# Patient Record
Sex: Female | Born: 1980
Health system: Southern US, Community
[De-identification: ages and names within clinical notes are randomized; demographics above are authoritative.]

## PROBLEM LIST (undated history)

## (undated) DIAGNOSIS — F319 Bipolar disorder, unspecified: Secondary | ICD-10-CM

## (undated) DIAGNOSIS — Z8659 Personal history of other mental and behavioral disorders: Secondary | ICD-10-CM

## (undated) DIAGNOSIS — E785 Hyperlipidemia, unspecified: Secondary | ICD-10-CM

## (undated) DIAGNOSIS — I2510 Atherosclerotic heart disease of native coronary artery without angina pectoris: Secondary | ICD-10-CM

## (undated) DIAGNOSIS — Z720 Tobacco use: Secondary | ICD-10-CM

## (undated) DIAGNOSIS — F329 Major depressive disorder, single episode, unspecified: Secondary | ICD-10-CM

## (undated) DIAGNOSIS — E109 Type 1 diabetes mellitus without complications: Secondary | ICD-10-CM

## (undated) DIAGNOSIS — F419 Anxiety disorder, unspecified: Secondary | ICD-10-CM

## (undated) HISTORY — DX: Bipolar disorder, unspecified: F31.9

## (undated) HISTORY — DX: Personal history of other mental and behavioral disorders: Z86.59

---
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
## 2001-08-30 ENCOUNTER — Encounter: Payer: Self-pay | Admitting: Emergency Medicine

## 2001-08-31 ENCOUNTER — Inpatient Hospital Stay (HOSPITAL_COMMUNITY): Admission: EM | Admit: 2001-08-31 | Discharge: 2001-09-04 | Payer: Self-pay | Admitting: Emergency Medicine

## 2001-10-15 ENCOUNTER — Encounter: Admission: RE | Admit: 2001-10-15 | Discharge: 2002-01-13 | Payer: Self-pay | Admitting: Internal Medicine

## 2002-07-15 ENCOUNTER — Encounter: Admission: RE | Admit: 2002-07-15 | Discharge: 2002-10-13 | Payer: Self-pay | Admitting: Internal Medicine

## 2016-11-04 DIAGNOSIS — E109 Type 1 diabetes mellitus without complications: Secondary | ICD-10-CM | POA: Insufficient documentation

## 2016-12-06 DIAGNOSIS — F329 Major depressive disorder, single episode, unspecified: Secondary | ICD-10-CM | POA: Insufficient documentation

## 2016-12-06 DIAGNOSIS — F32A Depression, unspecified: Secondary | ICD-10-CM | POA: Insufficient documentation

## 2017-12-25 ENCOUNTER — Ambulatory Visit: Payer: Self-pay | Admitting: Family Medicine

## 2017-12-25 VITALS — BP 105/80 | HR 86 | Temp 98.4°F | Resp 16 | Wt 190.4 lb

## 2017-12-25 DIAGNOSIS — Z7189 Other specified counseling: Secondary | ICD-10-CM

## 2017-12-25 DIAGNOSIS — S6992XA Unspecified injury of left wrist, hand and finger(s), initial encounter: Secondary | ICD-10-CM

## 2017-12-25 DIAGNOSIS — Z7185 Encounter for immunization safety counseling: Secondary | ICD-10-CM

## 2017-12-25 NOTE — Progress Notes (Signed)
Alicia Phillips is a 37 y.o. female who presents today with concerns of Patient tripped and fell on uneven pavement approx 12 hours ago and reports using hand to brace self and multiple abrasions- she cleaned area onsite and bandaged. Of note patient's left hand is casted due to a previous FOOSH injury and is due to have the cast off in 1 week. She also reports chronic condition of note Type 1 diabetes that is under control. She is visiting for a brothers wedding and a [place] native but currently living and working in [place].  Review of Systems  Constitutional: Negative for chills, fever and malaise/fatigue.  HENT: Negative for congestion, ear discharge, ear pain, sinus pain and sore throat.   Eyes: Negative.   Respiratory: Negative for cough, sputum production and shortness of breath.   Cardiovascular: Negative.  Negative for chest pain.  Gastrointestinal: Negative for abdominal pain, diarrhea, nausea and vomiting.  Genitourinary: Negative for dysuria, frequency, hematuria and urgency.  Musculoskeletal: Negative for myalgias.       Fall and left hand injury  Skin: Negative.   Neurological: Negative for headaches.  Endo/Heme/Allergies: Negative.   Psychiatric/Behavioral: Negative.     O: Vitals:   12/25/17 0816  BP: 105/80  Pulse: 86  Resp: 16  Temp: 98.4 F (36.9 C)  SpO2: 97%     Physical Exam  Constitutional: She is oriented to person, place, and time. Vital signs are normal. She appears well-developed and well-nourished. She is active.  Non-toxic appearance. She does not have a sickly appearance.  HENT:  Head: Normocephalic.  Right Ear: Hearing, external ear and ear canal normal.  Left Ear: Hearing, external ear and ear canal normal.  Nose: Nose normal.  Mouth/Throat: Uvula is midline and oropharynx is clear and moist.  Neck: Normal range of motion. Neck supple.  Cardiovascular: Normal rate, regular rhythm, normal heart sounds and normal pulses.  Pulmonary/Chest: Effort  normal.  Abdominal: Soft. Bowel sounds are normal.  Musculoskeletal: Normal range of motion.  Lymphadenopathy:       Head (right side): No submental and no submandibular adenopathy present.       Head (left side): No submental and no submandibular adenopathy present.    She has no cervical adenopathy.  Neurological: She is alert and oriented to person, place, and time.  Skin: Skin is warm. Abrasion noted. There is erythema.     Red- abrasion from fall Blue- cast in place  Psychiatric: She has a normal mood and affect.  Vitals reviewed.    A: 1. Hand injury, left, initial encounter      P: Area cleaned and dressed TDAP and VIS provided  Exam findings, diagnosis etiology and medication use and indications reviewed with patient. Follow- Up and discharge instructions provided. No emergent/urgent issues found on exam.  Patient verbalized understanding of information provided and agrees with plan of care (POC), all questions answered.  1. Hand injury, left, initial encounter  Other orders - insulin glargine (LANTUS) 100 UNIT/ML injection; Inject into the skin at bedtime. - insulin aspart (NOVOLOG) 100 UNIT/ML injection; Inject into the skin 3 (three) times daily before meals. - buPROPion (WELLBUTRIN XL) 150 MG 24 hr tablet; Take 150 mg by mouth daily.

## 2017-12-25 NOTE — Patient Instructions (Signed)
Tdap Vaccine (Tetanus, Diphtheria and Pertussis): What You Need to Know 1. Why get vaccinated? Tetanus, diphtheria and pertussis are very serious diseases. Tdap vaccine can protect us from these diseases. And, Tdap vaccine given to pregnant women can protect newborn babies against pertussis. TETANUS (Lockjaw) is rare in the [place] today. It causes painful muscle tightening and stiffness, usually all over the body.  It can lead to tightening of muscles in the head and neck so you can't open your mouth, swallow, or sometimes even breathe. Tetanus kills about 1 out of 10 people who are infected even after receiving the best medical care.  DIPHTHERIA is also rare in the [place] today. It can cause a thick coating to form in the back of the throat.  It can lead to breathing problems, heart failure, paralysis, and death.  PERTUSSIS (Whooping Cough) causes severe coughing spells, which can cause difficulty breathing, vomiting and disturbed sleep.  It can also lead to weight loss, incontinence, and rib fractures. Up to 2 in 100 adolescents and 5 in 100 adults with pertussis are hospitalized or have complications, which could include pneumonia or death.  These diseases are caused by bacteria. Diphtheria and pertussis are spread from person to person through secretions from coughing or sneezing. Tetanus enters the body through cuts, scratches, or wounds. Before vaccines, as many as 200,000 cases of diphtheria, 200,000 cases of pertussis, and hundreds of cases of tetanus, were reported in the [place] each year. Since vaccination began, reports of cases for tetanus and diphtheria have dropped by about 99% and for pertussis by about 80%. 2. Tdap vaccine Tdap vaccine can protect adolescents and adults from tetanus, diphtheria, and pertussis. One dose of Tdap is routinely given at age 11 or 12. People who did not get Tdap at that age should get it as soon as possible. Tdap is especially  important for healthcare professionals and anyone having close contact with a baby younger than 12 months. Pregnant women should get a dose of Tdap during every pregnancy, to protect the newborn from pertussis. Infants are most at risk for severe, life-threatening complications from pertussis. Another vaccine, called Td, protects against tetanus and diphtheria, but not pertussis. A Td booster should be given every 10 years. Tdap may be given as one of these boosters if you have never gotten Tdap before. Tdap may also be given after a severe cut or burn to prevent tetanus infection. Your doctor or the person giving you the vaccine can give you more information. Tdap may safely be given at the same time as other vaccines. 3. Some people should not get this vaccine  A person who has ever had a life-threatening allergic reaction after a previous dose of any diphtheria, tetanus or pertussis containing vaccine, OR has a severe allergy to any part of this vaccine, should not get Tdap vaccine. Tell the person giving the vaccine about any severe allergies.  Anyone who had coma or long repeated seizures within 7 days after a childhood dose of DTP or DTaP, or a previous dose of Tdap, should not get Tdap, unless a cause other than the vaccine was found. They can still get Td.  Talk to your doctor if you: ? have seizures or another nervous system problem, ? had severe pain or swelling after any vaccine containing diphtheria, tetanus or pertussis, ? ever had a condition called Guillain-Barr Syndrome (GBS), ? aren't feeling well on the day the shot is scheduled. 4. Risks With any medicine, including   vaccines, there is a chance of side effects. These are usually mild and go away on their own. Serious reactions are also possible but are rare. Most people who get Tdap vaccine do not have any problems with it. Mild problems following Tdap: (Did not interfere with activities)  Pain where the shot was given (about  3 in 4 adolescents or 2 in 3 adults)  Redness or swelling where the shot was given (about 1 person in 5)  Mild fever of at least 100.4F (up to about 1 in 25 adolescents or 1 in 100 adults)  Headache (about 3 or 4 people in 10)  Tiredness (about 1 person in 3 or 4)  Nausea, vomiting, diarrhea, stomach ache (up to 1 in 4 adolescents or 1 in 10 adults)  Chills, sore joints (about 1 person in 10)  Body aches (about 1 person in 3 or 4)  Rash, swollen glands (uncommon)  Moderate problems following Tdap: (Interfered with activities, but did not require medical attention)  Pain where the shot was given (up to 1 in 5 or 6)  Redness or swelling where the shot was given (up to about 1 in 16 adolescents or 1 in 12 adults)  Fever over 102F (about 1 in 100 adolescents or 1 in 250 adults)  Headache (about 1 in 7 adolescents or 1 in 10 adults)  Nausea, vomiting, diarrhea, stomach ache (up to 1 or 3 people in 100)  Swelling of the entire arm where the shot was given (up to about 1 in 500).  Severe problems following Tdap: (Unable to perform usual activities; required medical attention)  Swelling, severe pain, bleeding and redness in the arm where the shot was given (rare).  Problems that could happen after any vaccine:  People sometimes faint after a medical procedure, including vaccination. Sitting or lying down for about 15 minutes can help prevent fainting, and injuries caused by a fall. Tell your doctor if you feel dizzy, or have vision changes or ringing in the ears.  Some people get severe pain in the shoulder and have difficulty moving the arm where a shot was given. This happens very rarely.  Any medication can cause a severe allergic reaction. Such reactions from a vaccine are very rare, estimated at fewer than 1 in a million doses, and would happen within a few minutes to a few hours after the vaccination. As with any medicine, there is a very remote chance of a vaccine  causing a serious injury or death. The safety of vaccines is always being monitored. For more information, visit: www.cdc.gov/vaccinesafety/ 5. What if there is a serious problem? What should I look for? Look for anything that concerns you, such as signs of a severe allergic reaction, very high fever, or unusual behavior. Signs of a severe allergic reaction can include hives, swelling of the face and throat, difficulty breathing, a fast heartbeat, dizziness, and weakness. These would usually start a few minutes to a few hours after the vaccination. What should I do?  If you think it is a severe allergic reaction or other emergency that can't wait, call 9-1-1 or get the person to the nearest hospital. Otherwise, call your doctor.  Afterward, the reaction should be reported to the Vaccine Adverse Event Reporting System (VAERS). Your doctor might file this report, or you can do it yourself through the VAERS web site at www.vaers.hhs.gov, or by calling 1-800-822-7967. ? VAERS does not give medical advice. 6. The National Vaccine Injury Compensation Program The National   Vaccine Injury Compensation Program (VICP) is a federal program that was created to compensate people who may have been injured by certain vaccines. Persons who believe they may have been injured by a vaccine can learn about the program and about filing a claim by calling 1-714-748-6476 or visiting the VICP website at SpiritualWord.at. There is a time limit to file a claim for compensation. 7. How can I learn more?  Ask your doctor. He or she can give you the vaccine package insert or suggest other sources of information.  Call your local or state health department.  Contact the Centers for Disease Control and Prevention (CDC): ? Call 9362789386 (1-800-CDC-INFO) or ? Visit CDC's website at PicCapture.uy CDC Tdap Vaccine VIS (09/21/13) This information is not intended to replace advice given to you by your  health care provider. Make sure you discuss any questions you have with your health care provider. Document Released: 01/14/2012 Document Revised: 04/04/2016 Document Reviewed: 04/04/2016 Elsevier Interactive Patient Education  2017 Elsevier Inc. Elastic Bandage and RICE What does an elastic bandage do? Elastic bandages come in different shapes and sizes. They generally provide support to your injury and reduce swelling while you are healing, but they can perform different functions. Your health care provider will help you to decide what is best for your protection, recovery, or rehabilitation following an injury. What are some general tips for using an elastic bandage?  Use the bandage as directed by the maker of the bandage that you are using.  Do not wrap the bandage too tightly. This may cut off the circulation in the arm or leg in the area below the bandage. ? If part of your body beyond the bandage becomes blue, numb, cold, swollen, or is more painful, your bandage is most likely too tight. If this occurs, remove your bandage and reapply it more loosely.  See your health care provider if the bandage seems to be making your problems worse rather than better.  An elastic bandage should be removed and reapplied every 3-4 hours or as directed by your health care provider. What is RICE? The routine care of many injuries includes rest, ice, compression, and elevation (RICE therapy). Rest Rest is required to allow your body to heal. Generally, you can resume your routine activities when you are comfortable and have been given permission by your health care provider. Ice Icing your injury helps to keep the swelling down and it reduces pain. Do not apply ice directly to your skin.  Put ice in a plastic bag.  Place a towel between your skin and the bag.  Leave the ice on for 20 minutes, 2-3 times per day.  Do this for as long as you are directed by your health care  provider. Compression Compression helps to keep swelling down, gives support, and helps with discomfort. Compression may be done with an elastic bandage. Elevation Elevation helps to reduce swelling and it decreases pain. If possible, your injured area should be placed at or above the level of your heart or the center of your chest. When should I seek medical care? You should seek medical care if:  You have persistent pain and swelling.  Your symptoms are getting worse rather than improving.  These symptoms may indicate that further evaluation or further X-rays are needed. Sometimes, X-rays may not show a small broken bone (fracture) until a number of days later. Make a follow-up appointment with your health care provider. Ask when your X-ray results will be ready. Make  sure that you get your X-ray results. When should I seek immediate medical care? You should seek immediate medical care if:  You have a sudden onset of severe pain at or below the area of your injury.  You develop redness or increased swelling around your injury.  You have tingling or numbness at or below the area of your injury that does not improve after you remove the elastic bandage.  This information is not intended to replace advice given to you by your health care provider. Make sure you discuss any questions you have with your health care provider. Document Released: 01/04/2002 Document Revised: 06/10/2016 Document Reviewed: 02/28/2014 Elsevier Interactive Patient Education  2018 [employer].

## 2019-03-23 DIAGNOSIS — L732 Hidradenitis suppurativa: Secondary | ICD-10-CM | POA: Diagnosis not present

## 2019-03-23 DIAGNOSIS — F39 Unspecified mood [affective] disorder: Secondary | ICD-10-CM | POA: Diagnosis not present

## 2019-03-23 DIAGNOSIS — E1065 Type 1 diabetes mellitus with hyperglycemia: Secondary | ICD-10-CM | POA: Diagnosis not present

## 2019-05-03 DIAGNOSIS — Z20828 Contact with and (suspected) exposure to other viral communicable diseases: Secondary | ICD-10-CM | POA: Diagnosis not present

## 2019-05-06 DIAGNOSIS — L732 Hidradenitis suppurativa: Secondary | ICD-10-CM | POA: Diagnosis not present

## 2019-05-06 DIAGNOSIS — R208 Other disturbances of skin sensation: Secondary | ICD-10-CM | POA: Diagnosis not present

## 2019-06-21 ENCOUNTER — Other Ambulatory Visit: Payer: Self-pay

## 2019-06-21 ENCOUNTER — Ambulatory Visit (INDEPENDENT_AMBULATORY_CARE_PROVIDER_SITE_OTHER): Payer: BC Managed Care – PPO | Admitting: Adult Health

## 2019-06-21 VITALS — BP 143/83 | HR 103 | Ht 67.0 in | Wt 184.0 lb

## 2019-06-21 DIAGNOSIS — F603 Borderline personality disorder: Secondary | ICD-10-CM | POA: Diagnosis not present

## 2019-06-21 DIAGNOSIS — F331 Major depressive disorder, recurrent, moderate: Secondary | ICD-10-CM

## 2019-06-21 DIAGNOSIS — F411 Generalized anxiety disorder: Secondary | ICD-10-CM

## 2019-06-21 DIAGNOSIS — F3181 Bipolar II disorder: Secondary | ICD-10-CM | POA: Diagnosis not present

## 2019-06-21 MED ORDER — LATUDA 20 MG PO TABS: 20.0000 mg | ORAL_TABLET | Freq: Every day | ORAL | 2 refills | Status: DC

## 2019-06-21 NOTE — Progress Notes (Signed)
[facility] MD/PA/NP Initial Note  06/21/2019 10:52 AM Alicia Phillips  MRN:  [id number]  Chief Complaint:   HPI:   Describes mood today as "not very good". Pleasant. Tearful throughout interview. Mood symptoms - reports severe depression, anxiety, and irritability. Stating "I have ups and downs". Feels more "low" than anything. Fixating on "negative stuff". Going through a 3 year break up. Was living with girlfriend until early July. They had a "terrible fight" and she got in the car and drove to [place]. Feels like she needs help with her "depression more than anything". Stating "I lash out at the people I love". Does not feel like current medications are working. Consumes 2 to 3 beers 5 days a week. "Crying" every day. Having mood swings - "more lows". Having a hard time "managing her diabetes" - stating "I need to do better with that". Has been doing some online counseling - "not working". Doesn't want to do anything - "watch TV or read". Stable interest and motivation. Taking medications as prescribed.  Energy levels lower - "very low energy". Active, does not have a regular exercise routine - walking daily and Yoga. Works full-time Games developer. Enjoys some usual interests and activities. Lives alone x 3 years. Teacher - virtual. Spending time with family - Mother, brother, and sister. Father local but has not contact with him.  Appetite adequate - "I have to force myself to eat". Weight gain - 30 pounds over past 2 years. . Sleeps well most nights. Averages 6 to 8 hours. Stating "sleep is not a problem". Focus and concentration stable. Completing tasks. Managing aspects of household. Work going well.  Denies SI or HI. Denies AH or VH. Has been "scratching herself until she bleeds twice over past two weeks".   Visit Diagnosis:    ICD-10-CM   1. Major depressive disorder, recurrent episode, moderate (HCC)  F33.1   2. Generalized anxiety disorder  F41.1   3. Borderline personality disorder ([place])   F60.3   4. Bipolar II disorder ([place])  F31.81     Past Psychiatric History: Denies psychiatric hospitalizations.   Past Medical History: No past medical history on file.   Family Psychiatric History: Father has depression - manic tendencies. Younger brother with ETOH disorder - doing better. Mother and younger brother depressed.   Family History: No family history on file.  Social History:  Social History   Socioeconomic History  . Marital status: Single    Spouse name: Not on file  . Number of children: Not on file  . Years of education: Not on file  . Highest education level: Not on file  Occupational History  . Not on file  Social Needs  . Financial resource strain: Not on file  . Food insecurity    Worry: Not on file    Inability: Not on file  . Transportation needs    Medical: Not on file    Non-medical: Not on file  Tobacco Use  . Smoking status: Not on file  Substance and Sexual Activity  . Alcohol use: Not on file  . Drug use: Not on file  . Sexual activity: Not on file  Lifestyle  . Physical activity    Days per week: Not on file    Minutes per session: Not on file  . Stress: Not on file  Relationships  . Social Herbalist on phone: Not on file    Gets together: Not on file    Attends religious service:  Not on file    Active member of club or organization: Not on file    Attends meetings of clubs or organizations: Not on file    Relationship status: Not on file  Other Topics Concern  . Not on file  Social History Narrative  . Not on file    Allergies:  Allergies  Allergen Reactions  . Septra [Sulfamethoxazole-Trimethoprim]     Metabolic Disorder Labs: No results found for: HGBA1C, MPG No results found for: PROLACTIN No results found for: CHOL, TRIG, HDL, CHOLHDL, VLDL, LDLCALC No results found for: TSH  Therapeutic Level Labs: No results found for: LITHIUM No results found for: VALPROATE No components found for:  CBMZ  Current  Medications: Current Outpatient Medications  Medication Sig Dispense Refill  . ACETAMINOPHEN EXTRA STRENGTH 500 MG tablet     . busPIRone (BUSPAR) 10 MG tablet     . clindamycin (CLEOCIN T) 1 % external solution Apply 1 application topically 2 (two) times daily.    . insulin aspart (NOVOLOG) 100 UNIT/ML injection Inject into the skin 3 (three) times daily before meals.    Marland Kitchen NOVOLOG PENFILL cartridge     . spironolactone (ALDACTONE) 50 MG tablet Take 2 tablet by mouth once a day  take 1 tablet for 1 weeks, then increase to 2 daily if tolerated    . venlafaxine XR (EFFEXOR-XR) 150 MG 24 hr capsule      No current facility-administered medications for this visit.     Medication Side Effects: none  Orders placed this visit:  No orders of the defined types were placed in this encounter.   Psychiatric Specialty Exam:  Review of Systems  Musculoskeletal: Negative for falls.  Neurological: Negative for tremors and seizures.  Psychiatric/Behavioral: Positive for depression. The patient is nervous/anxious.     Blood pressure (!) 143/83, pulse (!) 103, height 5\' 7"  (1.702 m), weight 184 lb (83.5 kg).Body mass index is 28.82 kg/m.  General Appearance: Neat and Well Groomed  Eye Contact:  Good  Speech:  Clear and Coherent and Normal Rate  Volume:  Normal  Mood:  Anxious, Depressed and Irritable  Affect:  Appropriate and Tearful  Thought Process:  Coherent and Descriptions of Associations: Intact  Orientation:  Full (Time, Place, and Person)  Thought Content: Logical   Suicidal Thoughts:  No  Homicidal Thoughts:  No  Memory:  WNL  Judgement:  Good  Insight:  Good  Psychomotor Activity:  Normal  Concentration:  Concentration: Good  Recall:  Good  Fund of Knowledge: Good  Language: Good  Assets:  Communication Skills Desire for Improvement Financial Resources/Insurance Housing Intimacy Leisure Time Physical Health Resilience Social  Support Talents/Skills Transportation Vocational/Educational  ADL's:  Intact  Cognition: WNL  Prognosis:  Good   Screenings:   Receiving Psychotherapy: No   Treatment Plan/Recommendations:   Plan:  1. Effexor XR 150mg  daily to 225mg  daily 2. Buspar 10mg  BID 3. Add Latuda 20 - 1/2 tablet daily x 7 days, then one tablet daily - 2 weeks supply given  Set up with therapist for BPD. Has seen a therapist in the past.  RTC 4 weeks  Patient advised to contact office with any questions, adverse effects, or acute worsening in signs and symptoms.    , NP

## 2019-06-30 ENCOUNTER — Other Ambulatory Visit: Payer: Self-pay | Admitting: Adult Health

## 2019-06-30 DIAGNOSIS — F331 Major depressive disorder, recurrent, moderate: Secondary | ICD-10-CM

## 2019-06-30 MED ORDER — ARIPIPRAZOLE 5 MG PO TABS: ORAL_TABLET | ORAL | 2 refills | Status: DC

## 2019-07-01 ENCOUNTER — Telehealth: Payer: Self-pay

## 2019-07-01 NOTE — Telephone Encounter (Signed)
Prior authorization submitted for Latuda 20 mg with [employer], medication was denied and require she try and fail another generic atypical such as aripiprazole, clozapine, ziprasidone, risperidone, quetiapine, or olanzapine.   Rollene Fare made aware and would like patient to try Abilify (Aripiprazole) 5 mg tablet. New Rx submitted.  Left patient voicemail to call back with information.

## 2019-07-01 NOTE — Telephone Encounter (Signed)
Alicia Phillips is aware of new Rx, she will return her samples of Latuda since she didn't start taking last week when she comes for visit to see Kayla.

## 2019-07-06 DIAGNOSIS — E109 Type 1 diabetes mellitus without complications: Secondary | ICD-10-CM | POA: Diagnosis not present

## 2019-07-06 DIAGNOSIS — L732 Hidradenitis suppurativa: Secondary | ICD-10-CM | POA: Diagnosis not present

## 2019-07-07 ENCOUNTER — Other Ambulatory Visit: Payer: Self-pay

## 2019-07-07 ENCOUNTER — Ambulatory Visit (INDEPENDENT_AMBULATORY_CARE_PROVIDER_SITE_OTHER): Payer: BC Managed Care – PPO | Admitting: Addiction (Substance Use Disorder)

## 2019-07-07 DIAGNOSIS — F3181 Bipolar II disorder: Secondary | ICD-10-CM

## 2019-07-07 DIAGNOSIS — F331 Major depressive disorder, recurrent, moderate: Secondary | ICD-10-CM

## 2019-07-07 MED ORDER — ARIPIPRAZOLE 5 MG PO TABS: ORAL_TABLET | ORAL | 2 refills | Status: DC

## 2019-07-07 NOTE — Telephone Encounter (Signed)
Rx for Abilify 5 mg [facility] on [facility] but her insurance only covers at [facility]. [facility] and [facility] to [facility] on [address].
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
## 2019-07-12 ENCOUNTER — Encounter: Payer: Self-pay | Admitting: Addiction (Substance Use Disorder)

## 2019-07-14 ENCOUNTER — Ambulatory Visit (INDEPENDENT_AMBULATORY_CARE_PROVIDER_SITE_OTHER): Payer: BC Managed Care – PPO | Admitting: Addiction (Substance Use Disorder)

## 2019-07-14 ENCOUNTER — Ambulatory Visit: Payer: BC Managed Care – PPO | Admitting: Adult Health

## 2019-07-14 ENCOUNTER — Encounter: Payer: Self-pay | Admitting: Addiction (Substance Use Disorder)

## 2019-07-14 DIAGNOSIS — F3181 Bipolar II disorder: Secondary | ICD-10-CM

## 2019-07-14 DIAGNOSIS — F331 Major depressive disorder, recurrent, moderate: Secondary | ICD-10-CM

## 2019-07-14 NOTE — Progress Notes (Signed)
[facility] Counselor/Therapist Progress Note  Name: Alicia Phillips      MRN: [id number]  Date: 07/14/2019  Time spent: 10:01-10:55am ; 54 minutes ; [id number]  Reported Symptoms:  Crying spell all morning, angry at her ex who is "moving on", emotional rollercoaster, very sad, depressed, fatigued and lack of motivation, self-judgement.  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    Behavior:  Appropriate  Motor:  Normal  Speech/Language:   Normal Rate  Affect:  Tearful  Mood:  sad  Thought process:  flight of ideas  Thought content:    WNL  Sensory/Perceptual disturbances:    WNL  Orientation:  x4  Attention:  Good  Concentration:  Good  Memory:  WNL  Fund of knowledge:   Good  Insight:    Good  Judgment:   Fair  Impulse Control:  Fair    Risk Assessment: Danger to Self:  No Self-injurious Behavior: No Danger to Others: No Duty to Warn:no Physical Aggression / Violence:No  Access to Firearms a concern: No  Gang Involvement:No  Patient / guardian was educated about steps to take if suicide or homicide risk level increases between visits: yes While future psychiatric events cannot be accurately predicted, the patient does not currently require acute inpatient psychiatric care and does not currently meet [facility] involuntary commitment criteria.  Virtual Visit via Telephone Note I Connected with patient by telephone, with their informed consent, and verified patient privacy and that I am speaking with the correct person using two identifiers. I discussed the limitations, risks, security and privacy concerns of performing psychotherapy and management service by telephone and the availability of in person appointments and confirmed their location. I also discussed with the patient that there may be a patient responsible charge related to this service. The patient expressed understanding and agreed to  proceed. I discussed the treatment planning with the patient. The patient was provided an opportunity to ask questions and all were answered. The patient agreed with the plan and demonstrated an understanding of the instructions. The patient was advised to call  our office if symptoms worsen or feel they are in a crisis state and need immediate contact.   Therapist Location: office Patient Location: home  Subjective: Client is upset after receiving a christmas card from her ex that she isnt included in. Client reported having a crying spell all morning and being angry/sad about her ex trying to "move on", She is angry at how things ended and how she was blamed for everything. Client is feeling this heaviness in her throat, stomach, and reported SUDS (subjective units of distress) of a 9 out of 10. Client processed sensations and thoughts using Brainspotting provided by the therapist, as she began to identify the toxic thoughts she is burdened by: I dont like that I was part of an affair- Im ashamed of myself. Client identified a sensation of ambivalence in the lower half of the body that's connected to a thought of: I can take care of myself doing yoga, but sometimes I dont want to because it brings up so much emotion. Client continued to process those emotions from her toes that were grounded/neutral until her SUDs somatically reduced to 4 out of 10. 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 Client challenged those thoughts with confidence and replaced it with a positive personal thought of: "I know my stuff and they just need to see my abilities/what I bring to the table". Therapist used MI, CBT, DBT, Mindfulness, Experiential, and BSP with client and client made progress AEB engaging in therapies and reducing SUDS and creating new  cognitions to call on during emotional times. Therapist normalized client's emotions since in current MDD episode and not yet feeling the helpful effects of the new medication and held space for client to feel hope of continued healing. Client regulated by end of session and plans to return next week to process further with therapist.    Medications: Current Outpatient Medications  Medication Sig Dispense Refill  . ACETAMINOPHEN EXTRA STRENGTH 500 MG tablet     . ARIPiprazole (ABILIFY) 5 MG tablet Take 1/2 tablet daily x 7 days, then one tablet daily. 30 tablet 2  . busPIRone (BUSPAR) 10 MG tablet     . clindamycin (CLEOCIN T) 1 % external solution Apply 1 application topically 2 (two) times daily.    . insulin aspart (NOVOLOG) 100 UNIT/ML injection Inject into the skin 3 (three) times daily before meals.    Marland Kitchen NOVOLOG PENFILL cartridge     . spironolactone (ALDACTONE) 50 MG tablet Take 2 tablet by mouth once a day  take 1 tablet for 1 weeks, then increase to 2 daily if tolerated    . venlafaxine XR (EFFEXOR-XR) 150 MG 24 hr capsule      No current facility-administered medications for this visit.    Diagnoses:    ICD-10-CM   1. Major depressive disorder, recurrent episode, moderate (HCC)  F33.1   2. Bipolar II disorder (HCC)  F31.81    Interventions: Cognitive Behavioral Therapy, Dialectical Behavioral Therapy, Mindfulness Meditation, Motivational Interviewing, Solution-Oriented/Positive Psychology, Humanistic/Existential, Insight-Oriented and Interpersonal, Brainspotting, Flash Technique.  Plan of Care:   Client is to return to meet with therapist Zoila Shutter weekly and evaluate moving to bi-monthly following 3-6 months.  Client is to make an appt and follow through with discussing with endocryologist getting a pump to regulate blood sugar and take care of self. Client is to continue taking Abilify to help regulate mood swings. Client is to practicing distress tolerance skills to work to  decrease- crying spells and angry outbursts, by 1. using deep breathing exercises for 30 minutes until calm and 2. Using CBT thought challenging skills to challenge thoughts (ie: writing the thought down, processing it with a support, and if unhealthy thought, then challenge with truth.) Client is also to engage in Brainspotting therapy with therapist as needed for emotion regulation improvement. Client is to practice new body scans to feel sensations and integrate/layer with new positive cognition: "my brain can heal".    Pauline Good, LCSW, LCAS, CCTP, CCS-I, BSP

## 2019-07-19 ENCOUNTER — Ambulatory Visit (INDEPENDENT_AMBULATORY_CARE_PROVIDER_SITE_OTHER): Payer: BC Managed Care – PPO | Admitting: Addiction (Substance Use Disorder)

## 2019-07-19 DIAGNOSIS — F603 Borderline personality disorder: Secondary | ICD-10-CM

## 2019-07-19 DIAGNOSIS — F3181 Bipolar II disorder: Secondary | ICD-10-CM
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
## 2019-07-26 ENCOUNTER — Encounter: Payer: Self-pay | Admitting: Addiction (Substance Use Disorder)

## 2019-07-26 ENCOUNTER — Other Ambulatory Visit: Payer: Self-pay

## 2019-07-26 ENCOUNTER — Ambulatory Visit (INDEPENDENT_AMBULATORY_CARE_PROVIDER_SITE_OTHER): Payer: BC Managed Care – PPO | Admitting: Addiction (Substance Use Disorder)

## 2019-07-26 DIAGNOSIS — F603 Borderline personality disorder: Secondary | ICD-10-CM | POA: Diagnosis not present

## 2019-07-26 DIAGNOSIS — F411 Generalized anxiety disorder: Secondary | ICD-10-CM

## 2019-07-26 DIAGNOSIS — F3181 Bipolar II disorder: Secondary | ICD-10-CM

## 2019-07-26 NOTE — Progress Notes (Signed)
[facility] Counselor/Therapist Progress Note  Name: ONALEE STEINBACH      MRN: [id number]  Date: 07/26/2019  Time spent: 10:00-10:57am ; 57 minutes ; [id number]  Reported Symptoms:  Crying spells, angry at self and doctor for her body's response to her new mood stabilizer medication that caused her blood sugar to spike and her diabetes to worsen- ketoacidosis. Extreme crying spells and switching between crying and stern talking about herself. Self-critical about self/ her body/ her relationships.   Mental Status Exam:   Appearance:   Not seen- telephone call  Behavior:  Appropriate  Motor:  Normal  Speech/Language:   Normal Rate  Affect:  Tearful  Mood:  sad  Thought process:  flight of ideas  Thought content:    WNL  Sensory/Perceptual disturbances:    WNL  Orientation:  x4  Attention:  Good  Concentration:  Good  Memory:  WNL  Fund of knowledge:   Good  Insight:    Good  Judgment:   Fair  Impulse Control:  Fair    Risk Assessment: Danger to Self:  No Self-injurious Behavior: No Danger to Others: No Duty to Warn:no Physical Aggression / Violence:No  Access to Firearms a concern: No  Gang Involvement:No   Subjective: Virtual Visit via Telephone Note I Connected with patient by telephone, with their informed consent, and verified patient privacy and that I am speaking with the correct person using two identifiers. I discussed the limitations, risks, security and privacy concerns of performing psychotherapy and management service by telephone and the availability of in person appointments and confirmed their location. I also discussed with the patient that there may be a patient responsible charge related to this service. The patient expressed understanding and agreed to proceed. I discussed the treatment planning with the patient. The patient was provided an opportunity to ask questions and all were answered. The patient agreed with the plan and demonstrated an understanding of  the instructions. The patient was advised to call  our office if symptoms worsen or feel they are in a crisis state and need immediate contact. Therapist Location: office Patient Location: home Client started the session by exclaiming: "im mad at the doctor for giving me that medicine that caused my blood sugar to spike! I was so so sick over the holiday. Im just helpless." Client began to blame doctor and said she didn't want to create a divide between counselor and doctor, but continued to be upset. Client's cognitions quickly turned to self-deprivation and consisted of: "im so stupid for not checking my blood sugar earlier". Client worked with therapist using CBT, mindfulness, and self-compassion to help herself be kind to self and challenge thoughts along with remaining curious about what keeps her from checking it. Client while processing using BSP with therapist, identified core body issues and core beliefs that she and her body are unloveable and how her issues link back to that. Client made progress in session and gained much insight into her issues pushing people away or hating on herself and was able to identify the reason she doesn't check her blood sugar: because in the past shes done it and then changed the necessary insulin to make it so she could unhealthily lose weight. Client continued to process her grief from the present and her actions/emotional health that she is upset over in comparison to when this first started: her childhood. Therapist used MI, BSP, and Gestalt to help client identify this and the exact root of her shame around gaining  weight as a child with type 1 diabetes, how this made her not attractive to men, how this made her attracted to other women with body issues, and continues to cause emotional turmoil and the tearing down of herself. Client upon finishing BSP was able to identify new cognition: "Im doing really well for myself./Im okay & Im safe." Client processed her goals  in relation to that and worked with therapist to tap this new cognition in.    Medications: Current Outpatient Medications  Medication Sig Dispense Refill  . ACETAMINOPHEN EXTRA STRENGTH 500 MG tablet     . ARIPiprazole (ABILIFY) 5 MG tablet Take 1/2 tablet daily x 7 days, then one tablet daily. 30 tablet 2  . busPIRone (BUSPAR) 10 MG tablet     . clindamycin (CLEOCIN T) 1 % external solution Apply 1 application topically 2 (two) times daily.    . insulin aspart (NOVOLOG) 100 UNIT/ML injection Inject into the skin 3 (three) times daily before meals.    Marland Kitchen NOVOLOG PENFILL cartridge     . spironolactone (ALDACTONE) 50 MG tablet Take 2 tablet by mouth once a day  take 1 tablet for 1 weeks, then increase to 2 daily if tolerated    . venlafaxine XR (EFFEXOR-XR) 150 MG 24 hr capsule      No current facility-administered medications for this visit.    Diagnoses:    ICD-10-CM   1. Bipolar II disorder (HCC)  F31.81   2. Borderline personality disorder (HCC)  F60.3   3. Generalized anxiety disorder  F41.1    Interventions: Cognitive Behavioral Therapy, Dialectical Behavioral Therapy, Mindfulness Meditation, Motivational Interviewing, Solution-Oriented/Positive Psychology, Humanistic/Existential, Insight-Oriented and Interpersonal, Brainspotting, Flash Technique, Tapping, Gestalt therapy,   Plan of Care:   Client is to return to meet with therapist Zoila Shutter weekly and evaluate moving to bi-monthly following 3-6 months. Client to journal daily to get out her 'nasty thoughts'. Client is to make an appt and follow through with discussing with endocryologist getting a pump to regulate blood sugar and take care of self. Client is to continue taking Abilify to help regulate mood swings. Client is to practicing distress tolerance skills to work to decrease- crying spells and angry outbursts, by 1. using deep breathing exercises for 30 minutes until calm and 2. Using CBT thought challenging skills to  challenge thoughts (ie: writing the thought down, processing it with a support, and if unhealthy thought, then challenge with truth.) Client is also to engage in Brainspotting therapy with therapist as needed for emotion regulation improvement. Client is to practice new body scans to feel sensations and integrate/layer with new positive cognition: "my brain can heal" & "my diabetes as a child didn't break my dad. I didn't break my dad." & "im doing really well/im okay!"   Pauline Good, LCSW, LCAS, CCTP, CCS-I, BSP

## 2019-08-02 ENCOUNTER — Ambulatory Visit (INDEPENDENT_AMBULATORY_CARE_PROVIDER_SITE_OTHER): Payer: BC Managed Care – PPO | Admitting: Addiction (Substance Use Disorder)

## 2019-08-02 DIAGNOSIS — F603 Borderline personality disorder: Secondary | ICD-10-CM | POA: Diagnosis not present

## 2019-08-02 DIAGNOSIS — F3181 Bipolar II disorder: Secondary | ICD-10-CM | POA: Diagnosis not present

## 2019-08-02 DIAGNOSIS — F411 Generalized anxiety disorder: Secondary | ICD-10-CM

## 2019-08-02 NOTE — Progress Notes (Signed)
[facility] Counselor/Therapist Progress Note  Name: Alicia Phillips      MRN: [id number]  Date: 08/02/2019  Time spent: 10:00-10:57am ; 57 minutes ; [id number]  Reported Symptoms:  Crying spells, angry at self and doctor for her body's response to her new mood stabilizer medication that caused her blood sugar to spike and her diabetes to worsen- ketoacidosis. Extreme crying spells and switching between crying and stern talking about herself. Self-critical about self/ her body/ her relationships.   Mental Status Exam:   Appearance:   Not seen- telephone call  Behavior:  Appropriate  Motor:  Normal  Speech/Language:   Normal Rate  Affect:  Tearful  Mood:  sad  Thought process:  flight of ideas  Thought content:    WNL  Sensory/Perceptual disturbances:    WNL  Orientation:  x4  Attention:  Good  Concentration:  Good  Memory:  WNL  Fund of knowledge:   Good  Insight:    Good  Judgment:   Fair  Impulse Control:  Fair    Risk Assessment: Danger to Self:  No Self-injurious Behavior: No Danger to Others: No Duty to Warn:no Physical Aggression / Violence:No  Access to Firearms a concern: No  Gang Involvement:No   Subjective: Virtual Visit via Telephone Note I Connected with patient by telephone, with their informed consent, and verified patient privacy and that I am speaking with the correct person using two identifiers. I discussed the limitations, risks, security and privacy concerns of performing psychotherapy and management service by telephone and the availability of in person appointments and confirmed their location. I also discussed with the patient that there may be a patient responsible charge related to this service. The patient expressed understanding and agreed to proceed.I discussed the treatment planning with the patient. The patient was provided an opportunity to ask questions and all were answered. The patient agreed with the plan and demonstrated an understanding of the  instructions. The patient was advised to call  our office if symptoms worsen or feel they are in a crisis state and need immediate contact. Therapist Location: office. Patient Location: home. Client shared about her struggle the last week realizing her eating patterns and diabetes management is not good because she is trying to keep her weight down. She shared her disordered eating and body image issues and worked with therapist using CBT to challenge them and find false truths shes telling herself. Client discussed patterns of thought and behavior and therapist reviewed information in a workbook for treating eating disorders. therapist inquired about client's relationship with food that's disordered and engaged in narrative therapy having her dream about what could be and what relationship she wants with her body. Client discussed her need to practice mindfulness and stillness to help her sit in her body and feel okay without numbing her emotions (or self-dislike) out. Client made progress implementing DBT distress tolerance technique in session to help her deal with the feeling that came up that made her not want to check her sugar and instead was able to allow herself to see the high sugar numbers and practice self-care during session while talking with therapist. Client made plan to make checking her sugar somewhat fun, to reduce shame of high numbers, rooted in the past childhood when she felt shed get "in trouble". Therapist used MI to validate client's experiences and thoughts while reflecting them back for client to see the progress she's making in therapy.   Medications: Current Outpatient Medications  Medication Sig Dispense  Refill  . ACETAMINOPHEN EXTRA STRENGTH 500 MG tablet     . ARIPiprazole (ABILIFY) 5 MG tablet Take 1/2 tablet daily x 7 days, then one tablet daily. 30 tablet 2  . busPIRone (BUSPAR) 10 MG tablet     . clindamycin (CLEOCIN T) 1 % external solution Apply 1 application topically  2 (two) times daily.    . insulin aspart (NOVOLOG) 100 UNIT/ML injection Inject into the skin 3 (three) times daily before meals.    Marland Kitchen NOVOLOG PENFILL cartridge     . spironolactone (ALDACTONE) 50 MG tablet Take 2 tablet by mouth once a day  take 1 tablet for 1 weeks, then increase to 2 daily if tolerated    . venlafaxine XR (EFFEXOR-XR) 150 MG 24 hr capsule      No current facility-administered medications for this visit.    Diagnoses:    ICD-10-CM   1. Bipolar II disorder (HCC)  F31.81   2. Borderline personality disorder (HCC)  F60.3   3. Generalized anxiety disorder  F41.1      Interventions: Cognitive Behavioral Therapy, Dialectical Behavioral Therapy, Mindfulness Meditation, Motivational Interviewing, Solution-Oriented/Positive Psychology, Brainspotting, Flash Technique, Brain/Body approaches to healing body images.  Plan of Care:   Client is to return to meet with therapist Zoila Shutter weekly and evaluate moving to bi-monthly following 3-6 months. Client to journal daily to get out her 'nasty thoughts'. Client is to make an appt and follow through with discussing with endocryologist getting a pump to regulate blood sugar and take care of self. Client is to continue taking Abilify to help regulate mood swings. Client is to practicing distress tolerance skills to work to decrease- crying spells and angry outbursts, by 1. using deep breathing exercises for 30 minutes until calm and 2. Using CBT thought challenging skills to challenge thoughts (ie: writing the thought down, processing it with a support, and if unhealthy thought, then challenge with truth.) Client is also to engage in Brainspotting therapy with therapist as needed for emotion regulation improvement. Client is to practice new body scans to feel sensations and integrate/layer with new positive cognition: "my brain can heal" & "my diabetes as a child didn't break my dad. I didn't break my dad." & "im doing really well/im okay!"  Client is to use mindfulness to help her stay in the present moment to help her find regulation in her hard emotions.    Pauline Good, LCSW, LCAS, CCTP, CCS-I, BSP

## 2019-08-03 ENCOUNTER — Ambulatory Visit (INDEPENDENT_AMBULATORY_CARE_PROVIDER_SITE_OTHER): Payer: BC Managed Care – PPO | Admitting: Adult Health

## 2019-08-03 ENCOUNTER — Encounter: Payer: Self-pay | Admitting: Adult Health

## 2019-08-03 DIAGNOSIS — F331 Major depressive disorder, recurrent, moderate: Secondary | ICD-10-CM

## 2019-08-03 DIAGNOSIS — F411 Generalized anxiety disorder: Secondary | ICD-10-CM | POA: Diagnosis not present

## 2019-08-03 DIAGNOSIS — F3181 Bipolar II disorder: Secondary | ICD-10-CM

## 2019-08-03 DIAGNOSIS — F603 Borderline personality disorder: Secondary | ICD-10-CM

## 2019-08-03 MED ORDER — LATUDA 20 MG PO TABS: 20.0000 mg | ORAL_TABLET | Freq: Every day | ORAL | 2 refills | Status: DC

## 2019-08-03 NOTE — Progress Notes (Signed)
Alicia Phillips [id number] July 23, 1981 39 y.o.  Virtual Visit via Telephone Note  I connected with pt on 08/03/19 at 10:20 AM EST by telephone and verified that I am speaking with the correct person using two identifiers.   I discussed the limitations, risks, security and privacy concerns of performing an evaluation and management service by telephone and the availability of in person appointments. I also discussed with the patient that there may be a patient responsible charge related to this service. The patient expressed understanding and agreed to proceed.   I discussed the assessment and treatment plan with the patient. The patient was provided an opportunity to ask questions and all were answered. The patient agreed with the plan and demonstrated an understanding of the instructions.   The patient was advised to call back or seek an in-person evaluation if the symptoms worsen or if the condition fails to improve as anticipated.  I provided 30 minutes of non-face-to-face time during this encounter.  The patient was located at home.  The provider was located at [facility].   Dorothyann Gibbs, NP   Subjective:   Patient ID:  Alicia Phillips is a 39 y.o. (DOB 1980-11-24) female.  Chief Complaint: No chief complaint on file.   HPI   Alicia Phillips presents for follow-up of GAD, MDD, Borderline personality disorder, BPD 2  Describes mood today as "low". Pleasant. Tearful throughout interview. Mood symptoms - reports depression, anxiety, and irritability. Was not able to start the [place] - PA from insurance required other medications trials prior. Started on Abilify. Patient reports "spikes" in sugar levels after trying the Abilify. Tearful. Stating "it really scared and concerned me". Tapered off of Abilify and feel better "physically". Would like to try the Latuda. Continues to have "ups and downs". Feels more "depressed" than anything. Not feeling as irritable and  "lashing out". Stayed at home for the holidays. Has stopped drinking. Varying interest and motivation. Has started seeing a therapist and feels it is going "well". Taking medications as prescribed.  Energy levels lower. Active, does not have a regular exercise routine. Walking daily and Yoga. Works full-time Film/video editor. Enjoys some usual interests and activities. Single. Lives alone. Teacher - virtual. Spending time with family - Mother, brother, and sister local.  Appetite adequate - "I have to force myself to eat". Weight gain - 30 pounds over past 2 years. . Sleeps well most nights. Averages 6 to 8 hours.  Focus and concentration stable. Completing tasks. Managing aspects of household. Work going well.  Denies SI or HI. Denies AH or VH.   Review of Systems:  Review of Systems  Musculoskeletal: Negative for gait problem.  Neurological: Negative for tremors.  Psychiatric/Behavioral:       Please refer to HPI    Medications: I have reviewed the patient's current medications.  Current Outpatient Medications  Medication Sig Dispense Refill  . ACETAMINOPHEN EXTRA STRENGTH 500 MG tablet     . busPIRone (BUSPAR) 10 MG tablet     . clindamycin (CLEOCIN T) 1 % external solution Apply 1 application topically 2 (two) times daily.    . insulin aspart (NOVOLOG) 100 UNIT/ML injection Inject into the skin 3 (three) times daily before meals.    Marland Kitchen lurasidone (LATUDA) 20 MG TABS tablet Take 1 tablet (20 mg total) by mouth daily. 30 tablet 2  . NOVOLOG PENFILL cartridge     . spironolactone (ALDACTONE) 50 MG tablet Take 2 tablet by mouth once a day  take 1 tablet for 1 weeks, then increase to 2 daily if tolerated    . venlafaxine XR (EFFEXOR-XR) 150 MG 24 hr capsule      No current facility-administered medications for this visit.    Medication Side Effects: None  Allergies:  Allergies  Allergen Reactions  . Septra [Sulfamethoxazole-Trimethoprim]     No past medical history on file.  No  family history on file.  Social History   Socioeconomic History  . Marital status: Single    Spouse name: Not on file  . Number of children: Not on file  . Years of education: Not on file  . Highest education level: Not on file  Occupational History  . Not on file  Tobacco Use  . Smoking status: Never Smoker  . Smokeless tobacco: Never Used  Substance and Sexual Activity  . Alcohol use: Not on file  . Drug use: Not on file  . Sexual activity: Not on file  Other Topics Concern  . Not on file  Social History Narrative  . Not on file   Social Determinants of Health   Financial Resource Strain:   . Difficulty of Paying Living Expenses: Not on file  Food Insecurity:   . Worried About Charity fundraiser in the Last Year: Not on file  . Ran Out of Food in the Last Year: Not on file  Transportation Needs:   . Lack of Transportation (Medical): Not on file  . Lack of Transportation (Non-Medical): Not on file  Physical Activity:   . Days of Exercise per Week: Not on file  . Minutes of Exercise per Session: Not on file  Stress:   . Feeling of Stress : Not on file  Social Connections:   . Frequency of Communication with Friends and Family: Not on file  . Frequency of Social Gatherings with Friends and Family: Not on file  . Attends Religious Services: Not on file  . Active Member of Clubs or Organizations: Not on file  . Attends Archivist Meetings: Not on file  . Marital Status: Not on file  Intimate Partner Violence:   . Fear of Current or Ex-Partner: Not on file  . Emotionally Abused: Not on file  . Physically Abused: Not on file  . Sexually Abused: Not on file    Past Medical History, Surgical history, Social history, and Family history were reviewed and updated as appropriate.   Please see review of systems for further details on the patient's review from today.   Objective:   Physical Exam:  There were no vitals taken for this visit.  Physical  Exam Constitutional:      General: She is not in acute distress.    Appearance: She is well-developed.  Musculoskeletal:        General: No deformity.  Neurological:     Mental Status: She is alert and oriented to person, place, and time.     Coordination: Coordination normal.  Psychiatric:        Attention and Perception: Attention and perception normal. She does not perceive auditory or visual hallucinations.        Mood and Affect: Mood is depressed. Mood is not anxious. Affect is tearful. Affect is not labile, blunt, angry or inappropriate.        Speech: Speech normal.        Behavior: Behavior normal.        Thought Content: Thought content normal. Thought content is not paranoid or delusional. Thought  content does not include homicidal or suicidal ideation. Thought content does not include homicidal or suicidal plan.        Cognition and Memory: Cognition and memory normal.        Judgment: Judgment normal.     Comments: Insight intact     Lab Review:  No results found for: NA, K, CL, CO2, GLUCOSE, BUN, CREATININE, CALCIUM, PROT, ALBUMIN, AST, ALT, ALKPHOS, BILITOT, GFRNONAA, GFRAA  No results found for: WBC, RBC, HGB, HCT, PLT, MCV, MCH, MCHC, RDW, LYMPHSABS, MONOABS, EOSABS, BASOSABS  No results found for: POCLITH, LITHIUM   No results found for: PHENYTOIN, PHENOBARB, VALPROATE, CBMZ   .res Assessment: Plan:    Plan:  1. Effexor XR 225mg  daily 2. Buspar 10mg  BID 3. Stopped Abilify 5mg  daily - increased BGS   4. Add Latuda 20mg  daily - will leave samples for patient to pick up  Consider Lamictal and Prozac  Set up with therapist for BPD. Has seen a therapist in the past.  RTC 4 weeks  Patient advised to contact office with any questions, adverse effects, or acute worsening in signs and symptoms.  Diagnoses and all orders for this visit:  Bipolar II disorder (HCC) -     lurasidone (LATUDA) 20 MG TABS tablet; Take 1 tablet (20 mg total) by mouth  daily.  Major depressive disorder, recurrent episode, moderate (HCC)  Generalized anxiety disorder  Borderline personality disorder (HCC)    Please see After Visit Summary for patient specific instructions.  No future appointments.  No orders of the defined types were placed in this encounter.     -------------------------------

## 2019-08-06 ENCOUNTER — Telehealth: Payer: Self-pay | Admitting: Adult Health

## 2019-08-06 NOTE — Telephone Encounter (Signed)
Pt was to start on new med Latuda, but her insurance will not pay for it. She wants to know if there is a second option that she can take that is not that expensive. Send to [facility] on [address]

## 2019-08-06 NOTE — Telephone Encounter (Signed)
Latuda needs to have a prior authorization submitted first before a change

## 2019-08-09 ENCOUNTER — Ambulatory Visit (INDEPENDENT_AMBULATORY_CARE_PROVIDER_SITE_OTHER): Payer: BC Managed Care – PPO | Admitting: Addiction (Substance Use Disorder)

## 2019-08-09 DIAGNOSIS — F411 Generalized anxiety disorder: Secondary | ICD-10-CM

## 2019-08-09 DIAGNOSIS — F3181 Bipolar II disorder: Secondary | ICD-10-CM

## 2019-08-09 NOTE — Telephone Encounter (Signed)
Patient made aware. I left her a detailed message and asked her to call back with any questions.

## 2019-08-09 NOTE — Progress Notes (Signed)
[facility] Counselor/Therapist Progress Note  Name: Alicia Phillips      MRN: [id number]  Date: 08/09/2019  Time spent: 9:05-9:58 53 minutes  Reported Symptoms:  Upset/hopeless, angry at trouble getting meds. Crying spells back and forth while talking with therapist  Mental Status Exam:   Appearance:   groomed  Behavior:  Appropriate  Motor:  Normal  Speech/Language:   Normal Rate  Affect:  Congruent and Tearful  Mood:  irritable, labile and sad  Thought process:  flight of ideas and tangential  Thought content:    Obsessions and Tangential  Sensory/Perceptual disturbances:    WNL  Orientation:  x4  Attention:  Good  Concentration:  Good  Memory:  WNL  Fund of knowledge:   Good  Insight:    Good  Judgment:   Good  Impulse Control:  Fair    Risk Assessment: Danger to Self:  No Self-injurious Behavior: No Danger to Others: No Duty to Warn:no Physical Aggression / Violence:No  Access to Firearms a concern: No  Gang Involvement:No   Subjective: Virtual Visit via Telephone Note Therapist connected with patient by telephone, with their informed consent, and verified patient privacy and that I am speaking with the correct person using two identifiers. I discussed the limitations, risks, security and privacy concerns of performing psychotherapy and management service by telephone and the availability of in person appointments and confirmed their location. I also discussed with the patient that there may be a patient responsible charge related to this service. The patient expressed understanding and agreed to proceed.I discussed her treatment planning with the patient. The patient was provided an opportunity to ask questions and all were answered. The patient agreed with the plan and demonstrated an understanding of the instructions. The patient was told to call 9-1-1 if in emergency or call the office if symptoms worsen in relation to her new medication. Therapist Location: office.  Patient Location: home. Client expressed increased hopelessness around issues about taking new medication Latuda. Therapist used MI and CBT and narrative therapy to affirm clients feelings and thoughts plaguing her along with encouraging the use of the miracle question to help client have hope for positive change. Client shared her obsessive anxiety about triggering her blood sugar spikes from new medication. Client unable to pay the $125 her insurance is charging her to fill medication and was hopeless about the situation. Therapist used SFT and MI and RPT to help client organize her plan and even find a patient copay program that brought it down to $15 a perscription for her MH medication. Therapist and client worked together on setting up all these items and working on thoughts to help her try the medication and remain hopeful about its efficacy while observing how it affects her diabetes. Client made progress in session with therapist working through the issue to help her find emotion regulation by end of session.    Medications: Current Outpatient Medications  Medication Sig Dispense Refill  . ACETAMINOPHEN EXTRA STRENGTH 500 MG tablet     . busPIRone (BUSPAR) 10 MG tablet     . clindamycin (CLEOCIN T) 1 % external solution Apply 1 application topically 2 (two) times daily.    . insulin aspart (NOVOLOG) 100 UNIT/ML injection Inject into the skin 3 (three) times daily before meals.    Marland Kitchen lurasidone (LATUDA) 20 MG TABS tablet Take 1 tablet (20 mg total) by mouth daily. 30 tablet 2  . NOVOLOG PENFILL cartridge     . spironolactone (ALDACTONE) 50  MG tablet Take 2 tablet by mouth once a day  take 1 tablet for 1 weeks, then increase to 2 daily if tolerated    . venlafaxine XR (EFFEXOR-XR) 150 MG 24 hr capsule      No current facility-administered medications for this visit.    Diagnoses:    ICD-10-CM   1. Bipolar II disorder ([place])  F31.81   2. Generalized anxiety disorder  F41.1       Interventions: Cognitive Behavioral Therapy, Dialectical Behavioral Therapy, Mindfulness Meditation, Motivational Interviewing, Solution-Oriented/Positive Psychology, Brainspotting, Flash Technique, Narrative Therapy, Brain/Body approaches to healing body images.  Plan of Care:  Client is to return to meet with therapist Sammuel Cooper weekly and evaluate moving to bi-monthly following 3-6 months. Client to journal daily to get out her 'nasty thoughts'. Client is to make an appt and follow through with discussing with endocryologist getting a pump to regulate blood sugar and take care of self. Client is to continue taking Abilify to help regulate mood swings. Client is to practicing distress tolerance skills to work to decrease- crying spells and angry outbursts, by 1. using deep breathing exercises for 30 minutes until calm and 2. Using CBT thought challenging skills to challenge thoughts (ie: writing the thought down, processing it with a support, and if unhealthy thought, then challenge with truth.) Client is also to engage in [place] therapy with therapist as needed for emotion regulation improvement. Client is to practice new body scans to feel sensations and integrate/layer with new positive cognition: "my brain can heal" & "my diabetes as a child didn't break my dad. I didn't break my dad." & "im doing really well/im okay!" Client is to use mindfulness to help her stay in the present moment to help her find regulation in her hard emotions.  Client to call pharmacy to ask about using Latuda discount savings card to reduce price of medication from $125 to $15 a month for at least 12 months.    Barnie Del, LCSW, LCAS, CCTP, CCS-I, BSP

## 2019-08-09 NOTE — Telephone Encounter (Signed)
Noted  

## 2019-08-11 NOTE — Telephone Encounter (Signed)
Called [employer] at [phone] to check status of Prior Authorization for Latuda 20 mg, it has been approved effective 08/10/2019-08/09/2020   [id number]

## 2019-08-25 ENCOUNTER — Ambulatory Visit (INDEPENDENT_AMBULATORY_CARE_PROVIDER_SITE_OTHER): Payer: BC Managed Care – PPO | Admitting: Addiction (Substance Use Disorder)

## 2019-08-25 DIAGNOSIS — F3181 Bipolar II disorder: Secondary | ICD-10-CM | POA: Diagnosis not present

## 2019-08-25 DIAGNOSIS — F603 Borderline personality disorder: Secondary | ICD-10-CM | POA: Diagnosis not present

## 2019-08-25 NOTE — Progress Notes (Signed)
[facility] Counselor/Therapist Progress Note  Name: Alicia Phillips      MRN: [id number]  Date: 08/25/2019  Time spent: 9:05-9:58 53 minutes  Reported Symptoms:  Crying during session and during mornings for 3 ours. Feeling Hopelessness and still questioning her career and ability to get better with her MH.   Mental Status Exam:   Appearance:   discheveled  Behavior:  Appropriate  Motor:  Normal  Speech/Language:   Normal Rate  Affect:  Congruent and Tearful  Mood:  dysthymic, irritable, labile and sad  Thought process:  circumstantial  Thought content:    Obsessions and Tangential  Sensory/Perceptual disturbances:    WNL  Orientation:  x4  Attention:  Good  Concentration:  Good  Memory:  WNL  Fund of knowledge:   Good  Insight:    Fair  Judgment:   Good  Impulse Control:  Fair    Risk Assessment: Danger to Self:  No Self-injurious Behavior: No Danger to Others: No Duty to Warn:no Physical Aggression / Violence:No  Access to Firearms a concern: No  Gang Involvement:No   Subjective: Virtual Visit via Telephone Note Therapist connected with patient by telephone, with their informed consent, and verified patient privacy and that I am speaking with the correct person using two identifiers. I discussed the limitations, risks, security and privacy concerns of performing psychotherapy and management service by telephone and the availability of in person appointments and confirmed their location. I also discussed with the patient that there may be a patient responsible charge related to this service. The patient expressed understanding and agreed to proceed.I discussed her treatment planning with the patient. The patient was provided an opportunity to ask questions and all were answered. The patient agreed with the plan and demonstrated an understanding of the instructions. The patient was told to call 9-1-1 if in emergency or call the office if symptoms worsen in relation to her new  medication. Therapist Location: office. Patient Location: home. Client shared about her inability to get better with crying spells and despairing even with her new medicines. Client expressed her most deep fears, rooted in childhood fears related to getting sicker when she had diabetes complications after getting diagnosed with Diabetes 1. Therapist used MI, BSP, and Mindfulness to help affirm clients experiences, provide support and a safe place to process her fears and vulnerabilites about not getting better. Client made progress connecting her fear of not getting better from her MH issues to a strong sensation of being alone and in pain/ left to suffer and therapist provided empathy for client, encouraging her to do the same thing compassionately for herself as a younger self, using IFS.  Client processed her desire to find solutions and keep working to have patience to see small improvements in her MH day by day. Therapist used CBT with client for her to hear her self-defeating thoughts getting in the way of her feeling like she can meet her said goals. Client reported more improvement with anxiety and other symptoms as a part of her MH, except her crying spells. However, client in ending session, demonstrated more emotional regulation.  Medications: Current Outpatient Medications  Medication Sig Dispense Refill  . ACETAMINOPHEN EXTRA STRENGTH 500 MG tablet     . busPIRone (BUSPAR) 10 MG tablet     . clindamycin (CLEOCIN T) 1 % external solution Apply 1 application topically 2 (two) times daily.    . insulin aspart (NOVOLOG) 100 UNIT/ML injection Inject into the skin 3 (three) times daily  before meals.    Marland Kitchen lurasidone (LATUDA) 20 MG TABS tablet Take 1 tablet (20 mg total) by mouth daily. 30 tablet 2  . NOVOLOG PENFILL cartridge     . spironolactone (ALDACTONE) 50 MG tablet Take 2 tablet by mouth once a day  take 1 tablet for 1 weeks, then increase to 2 daily if tolerated    . venlafaxine XR  (EFFEXOR-XR) 150 MG 24 hr capsule      No current facility-administered medications for this visit.    Diagnoses:    ICD-10-CM   1. Bipolar II disorder ([place])  F31.81   2. Borderline personality disorder ([place])  F60.3      Interventions: Cognitive Behavioral Therapy, Dialectical Behavioral Therapy,  Motivational Interviewing, Solution-Oriented/Positive Psychology, Brainspotting, Brain/Body approaches to healing body images, Internal-Family Systems.  Plan of Care:  Client is to return to meet with therapist Sammuel Cooper weekly and evaluate moving to bi-monthly following 3-6 months. Client to journal daily to get out her 'nasty thoughts'. Client is to make an appt and follow through with discussing with endocryologist getting a pump to regulate blood sugar and take care of self. Client is to continue taking Abilify to help regulate mood swings. Client is to practicing distress tolerance skills to work to decrease- crying spells and angry outbursts, by 1. using deep breathing exercises for 30 minutes until calm and 2. Using CBT thought challenging skills to challenge thoughts (ie: writing the thought down, processing it with a support, and if unhealthy thought, then challenge with truth.) Client is also to engage in [place] therapy with therapist as needed for emotion regulation improvement. Client is to practice new body scans to feel sensations and integrate/layer with new positive cognition: "my brain can heal" & "my diabetes as a child didn't break my dad. I didn't break my dad." & "im doing really well/im okay!" Client is to use mindfulness to help her stay in the present moment to help her find regulation in her hard emotions.  Client to call pharmacy to ask about using Latuda discount savings card to reduce price of medication from $125 to $15 a month for at least 12 months.    Barnie Del, LCSW, LCAS, CCTP, CCS-I, BSP

## 2019-08-31 ENCOUNTER — Encounter: Payer: Self-pay | Admitting: Addiction (Substance Use Disorder)

## 2019-09-06 ENCOUNTER — Ambulatory Visit (INDEPENDENT_AMBULATORY_CARE_PROVIDER_SITE_OTHER): Payer: BC Managed Care – PPO | Admitting: Adult Health

## 2019-09-06 ENCOUNTER — Encounter: Payer: Self-pay | Admitting: Adult Health

## 2019-09-06 DIAGNOSIS — F411 Generalized anxiety disorder: Secondary | ICD-10-CM

## 2019-09-06 DIAGNOSIS — F331 Major depressive disorder, recurrent, moderate: Secondary | ICD-10-CM | POA: Diagnosis not present

## 2019-09-06 DIAGNOSIS — F3181 Bipolar II disorder: Secondary | ICD-10-CM | POA: Diagnosis not present

## 2019-09-06 DIAGNOSIS — F603 Borderline personality disorder: Secondary | ICD-10-CM | POA: Diagnosis not present

## 2019-09-06 MED ORDER — LAMOTRIGINE 25 MG PO TABS: ORAL_TABLET | ORAL | 2 refills | Status: DC

## 2019-09-06 NOTE — Progress Notes (Signed)
Alicia Phillips [id number] 12/15/1980 39 y.o.  Virtual Visit via Telephone Note  I connected with pt on 09/06/19 at  5:20 PM EST by telephone and verified that I am speaking with the correct person using two identifiers.   I discussed the limitations, risks, security and privacy concerns of performing an evaluation and management service by telephone and the availability of in person appointments. I also discussed with the patient that there may be a patient responsible charge related to this service. The patient expressed understanding and agreed to proceed.   I discussed the assessment and treatment plan with the patient. The patient was provided an opportunity to ask questions and all were answered. The patient agreed with the plan and demonstrated an understanding of the instructions.   The patient was advised to call back or seek an in-person evaluation if the symptoms worsen or if the condition fails to improve as anticipated.  I provided 30 minutes of non-face-to-face time during this encounter.  The patient was located at home.  The provider was located at [facility].   Dorothyann Gibbs, NP   Subjective:   Patient ID:  Alicia Phillips is a 39 y.o. (DOB [birth year]) female.  Chief Complaint: No chief complaint on file.   HPI Alicia Phillips presents for follow-up of GAD, MDD, Borderline personality disorder, BPD 2.  Describes mood today as "not the best". Pleasant. Tearful throughout interview. Mood symptoms - reports depression, anxiety, and irritability. Stating "I'm not doing so well". Has been having "a difficult time". Increased worry and rumination. Focusing on things she can't change. Feels out of control sometimes. Isolation is hard for me. Abandoment issues. Having some manic responses. Upset with her family because they haven't checked in with her regularly. Some arguing between she and family. Lashed out at them. Grandmother with more depression. Wanting a  continuous glucose pump. Varying interest and motivation. Seeing therapist. Taking medications as prescribed.  Energy levels lower. Active, has a regular exercise routine. Walking daily and Yoga. Works full-time Film/video editor. Enjoys some usual interests and activities. Single. Lives alone. Teacher - virtual. Spending time with family - Mother, brother, and sister local.  Appetite decreased. Weight loss - 5 pounds. Has gained 30 pounds over past year.  Sleeps well most nights. Averages 6 to 8 hours. Napping some days. Focus and concentration stable. Completing tasks. Managing aspects of household. Work going well. Recently had a 6 days break. Has been able to function at work.  Denies SI or HI. Denies AH or VH.   Previous medications: Latuda, Abilify, Prozac   Review of Systems:  Review of Systems  Musculoskeletal: Negative for gait problem.  Neurological: Negative for tremors.  Psychiatric/Behavioral:       Please refer to HPI    Medications: I have reviewed the patient's current medications.  Current Outpatient Medications  Medication Sig Dispense Refill  . ACETAMINOPHEN EXTRA STRENGTH 500 MG tablet     . busPIRone (BUSPAR) 10 MG tablet     . clindamycin (CLEOCIN T) 1 % external solution Apply 1 application topically 2 (two) times daily.    . insulin aspart (NOVOLOG) 100 UNIT/ML injection Inject into the skin 3 (three) times daily before meals.    . lamoTRIgine (LAMICTAL) 25 MG tablet Take one tablet at bedtime for 14 days, then take two tablets at bedtime. 60 tablet 2  . lurasidone (LATUDA) 20 MG TABS tablet Take 1 tablet (20 mg total) by mouth daily. 30 tablet 2  .  NOVOLOG PENFILL cartridge     . spironolactone (ALDACTONE) 50 MG tablet Take 2 tablet by mouth once a day  take 1 tablet for 1 weeks, then increase to 2 daily if tolerated    . venlafaxine XR (EFFEXOR-XR) 150 MG 24 hr capsule      No current facility-administered medications for this visit.    Medication Side Effects:  None  Allergies:  Allergies  Allergen Reactions  . Septra [Sulfamethoxazole-Trimethoprim]     No past medical history on file.  No family history on file.  Social History   Socioeconomic History  . Marital status: Single    Spouse name: Not on file  . Number of children: Not on file  . Years of education: Not on file  . Highest education level: Not on file  Occupational History  . Not on file  Tobacco Use  . Smoking status: Never Smoker  . Smokeless tobacco: Never Used  Substance and Sexual Activity  . Alcohol use: Not on file  . Drug use: Not on file  . Sexual activity: Not on file  Other Topics Concern  . Not on file  Social History Narrative  . Not on file   Social Determinants of Health   Financial Resource Strain:   . Difficulty of Paying Living Expenses: Not on file  Food Insecurity:   . Worried About Programme researcher, broadcasting/film/video in the Last Year: Not on file  . Ran Out of Food in the Last Year: Not on file  Transportation Needs:   . Lack of Transportation (Medical): Not on file  . Lack of Transportation (Non-Medical): Not on file  Physical Activity:   . Days of Exercise per Week: Not on file  . Minutes of Exercise per Session: Not on file  Stress:   . Feeling of Stress : Not on file  Social Connections:   . Frequency of Communication with Friends and Family: Not on file  . Frequency of Social Gatherings with Friends and Family: Not on file  . Attends Religious Services: Not on file  . Active Member of Clubs or Organizations: Not on file  . Attends Banker Meetings: Not on file  . Marital Status: Not on file  Intimate Partner Violence:   . Fear of Current or Ex-Partner: Not on file  . Emotionally Abused: Not on file  . Physically Abused: Not on file  . Sexually Abused: Not on file    Past Medical History, Surgical history, Social history, and Family history were reviewed and updated as appropriate.   Please see review of systems for further  details on the patient's review from today.   Objective:   Physical Exam:  There were no vitals taken for this visit.  Physical Exam Constitutional:      General: She is not in acute distress.    Appearance: She is well-developed.  Musculoskeletal:        General: No deformity.  Neurological:     Mental Status: She is alert and oriented to person, place, and time.     Coordination: Coordination normal.  Psychiatric:        Attention and Perception: Attention and perception normal. She does not perceive auditory or visual hallucinations.        Mood and Affect: Mood normal. Mood is not anxious or depressed. Affect is not labile, blunt, angry or inappropriate.        Speech: Speech normal.        Behavior: Behavior normal.  Thought Content: Thought content normal. Thought content is not paranoid or delusional. Thought content does not include homicidal or suicidal ideation. Thought content does not include homicidal or suicidal plan.        Cognition and Memory: Cognition and memory normal.        Judgment: Judgment normal.     Comments: Insight intact     Lab Review:  No results found for: NA, K, CL, CO2, GLUCOSE, BUN, CREATININE, CALCIUM, PROT, ALBUMIN, AST, ALT, ALKPHOS, BILITOT, GFRNONAA, GFRAA  No results found for: WBC, RBC, HGB, HCT, PLT, MCV, MCH, MCHC, RDW, LYMPHSABS, MONOABS, EOSABS, BASOSABS  No results found for: POCLITH, LITHIUM   No results found for: PHENYTOIN, PHENOBARB, VALPROATE, CBMZ   .res Assessment: Plan:    Plan:  1. Effexor XR 225mg  daily 2. Buspar 10mg  BID 3. Add Lamictal 25mg  at hs x 14 days, then 50mg  daily   Consider Prozac  Therapy with Sammuel Cooper  RTC 4 weeks  Patient advised to contact office with any questions, adverse effects, or acute worsening in signs and symptoms.  Counseled patient regarding potential benefits, risks, and side effects of Lamictal to include potential risk of Stevens-Johnson syndrome. Advised patient to  stop taking Lamictal and contact office immediately if rash develops and to seek urgent medical attention if rash is severe and/or spreading quickly.   Diagnoses and all orders for this visit:  Major depressive disorder, recurrent episode, moderate (HCC) -     lamoTRIgine (LAMICTAL) 25 MG tablet; Take one tablet at bedtime for 14 days, then take two tablets at bedtime.  Generalized anxiety disorder  Bipolar II disorder (HCC) -     lamoTRIgine (LAMICTAL) 25 MG tablet; Take one tablet at bedtime for 14 days, then take two tablets at bedtime.  Borderline personality disorder ([place])    Please see After Visit Summary for patient specific instructions.  Future Appointments  Date Time Provider [place]  09/14/2019  8:00 AM Barnie Del, LCSW [facility] None    No orders of the defined types were placed in this encounter.     -------------------------------

## 2019-09-14 ENCOUNTER — Ambulatory Visit (INDEPENDENT_AMBULATORY_CARE_PROVIDER_SITE_OTHER): Payer: BC Managed Care – PPO | Admitting: Addiction (Substance Use Disorder)

## 2019-09-14 ENCOUNTER — Encounter: Payer: Self-pay | Admitting: Addiction (Substance Use Disorder)

## 2019-09-14 DIAGNOSIS — F3181 Bipolar II disorder: Secondary | ICD-10-CM | POA: Diagnosis not present

## 2019-09-14 DIAGNOSIS — F603 Borderline personality disorder: Secondary | ICD-10-CM

## 2019-09-14 NOTE — Progress Notes (Signed)
[facility] Counselor/Therapist Progress Note  Name: Alicia Phillips      MRN: [id number]  Date: 09/14/2019  Time spent: 8:08-9:00am 52 minutes  Reported Symptoms:  Crying spell full session during session, no ability to emotionally regulate, paranoid distorted panic thoughts/feeling.   Mental Status Exam:   Appearance:   discheveled  Behavior:  Blaming, Suspicious, Attention-Seeking and Agitated  Motor:  Normal  Speech/Language:   Normal Rate  Affect:  Congruent and Tearful  Mood:  anxious, irritable and labile  Thought process:  flight of ideas, loose associations and tangential  Thought content:    Obsessions, Paranoid Ideation, Rumination and Tangential  Sensory/Perceptual disturbances:    WNL  Orientation:  x4  Attention:  Good  Concentration:  Good  Memory:  WNL  Fund of knowledge:   Good  Insight:    Fair  Judgment:   Fair  Impulse Control:  Fair   Risk Assessment: Danger to Self:  No Self-injurious Behavior: No Danger to Others: No Duty to Warn:no Physical Aggression / Violence:No  Access to Firearms a concern: No  Gang Involvement:No   Subjective: Virtual Visit via Telephone Note Therapist connected with patient by telephone, with their informed consent, and verified patient privacy and that I am speaking with the correct person using two identifiers. I discussed the limitations, risks, security and privacy concerns of performing psychotherapy and management service by telephone and the availability of in person appointments and confirmed their location. I also discussed with the patient that there may be a patient responsible charge related to this service. The patient expressed understanding and agreed to proceed.I discussed her treatment planning with the patient. The patient was provided an opportunity to ask questions and all were answered. The patient agreed with the plan and demonstrated an understanding of the instructions. The patient was told to call 9-1-1 if  in emergency or call the office if symptoms worsen in relation to her new medication. Therapist Location: office. Patient Location: home. Client reported she is having to change medications because she reports all kinds of side effects and paranoid thoughts about them not helping her moods and labile moods and crying spells. Client reported understanding she is experiencing lots of instability of mood in relation to her borderline diagnosis and fear of abandonment. Therapist used MI and Mindfulness to help affirm clients emotions and provide support for client, while also reminding her to use DBT emotion regulation techniques. Client reported a desire to move out of that fear but complete desperation to not have that happen to her again and behaviors that push others away. Client struggling with making herself implement the coping skills and self-care therapist and her discuss in each session. Client reported a goal after session to buy a workbook for DBT to keep working on emotion regulation. Client worked on regulating her crying at the end of session.   Medications: Current Outpatient Medications  Medication Sig Dispense Refill  . ACETAMINOPHEN EXTRA STRENGTH 500 MG tablet     . busPIRone (BUSPAR) 10 MG tablet     . clindamycin (CLEOCIN T) 1 % external solution Apply 1 application topically 2 (two) times daily.    . insulin aspart (NOVOLOG) 100 UNIT/ML injection Inject into the skin 3 (three) times daily before meals.    . lamoTRIgine (LAMICTAL) 25 MG tablet Take one tablet at bedtime for 14 days, then take two tablets at bedtime. 60 tablet 2  . lurasidone (LATUDA) 20 MG TABS tablet Take 1 tablet (20 mg total) by  mouth daily. 30 tablet 2  . NOVOLOG PENFILL cartridge     . spironolactone (ALDACTONE) 50 MG tablet Take 2 tablet by mouth once a day  take 1 tablet for 1 weeks, then increase to 2 daily if tolerated    . venlafaxine XR (EFFEXOR-XR) 150 MG 24 hr capsule      No current  facility-administered medications for this visit.    Diagnoses:    ICD-10-CM   1. Borderline personality disorder (HCC)  F60.3   2. Bipolar II disorder (HCC)  F31.81     Interventions: Cognitive Behavioral Therapy, Dialectical Behavioral Therapy,  Motivational Interviewing, Solution-Oriented/Positive Psychology.  Plan of Care:  Client is to return to meet with therapist Zoila Shutter weekly and evaluate moving to bi-monthly following 3-6 months. Client to journal daily to get out her 'nasty thoughts'. Client is to make an appt and follow through with discussing with endocryologist getting a pump to regulate blood sugar and take care of self. Client is to continue taking Abilify to help regulate mood swings. Client is to practicing distress tolerance skills to work to decrease- crying spells and angry outbursts, by 1. using deep breathing exercises for 30 minutes until calm and 2. Using CBT thought challenging skills to challenge thoughts (ie: writing the thought down, processing it with a support, and if unhealthy thought, then challenge with truth.) Client is also to engage in Brainspotting therapy with therapist as needed for emotion regulation improvement. Client is to practice new body scans to feel sensations and integrate/layer with new positive cognition: "my brain can heal" & "my diabetes as a child didn't break my dad. I didn't break my dad." & "im doing really well/im okay!" Client is to use mindfulness to help her stay in the present moment to help her find regulation in her hard emotions.  Client to call pharmacy to ask about using Latuda discount savings card to reduce price of medication from $125 to $15 a month for at least 12 months.   Pauline Good, LCSW, LCAS, CCTP, CCS-I, BSP

## 2019-11-01 ENCOUNTER — Encounter: Payer: Self-pay | Admitting: Adult Health

## 2019-11-01 ENCOUNTER — Ambulatory Visit (INDEPENDENT_AMBULATORY_CARE_PROVIDER_SITE_OTHER): Payer: BC Managed Care – PPO | Admitting: Adult Health

## 2019-11-01 DIAGNOSIS — F3181 Bipolar II disorder: Secondary | ICD-10-CM | POA: Diagnosis not present

## 2019-11-01 DIAGNOSIS — F603 Borderline personality disorder: Secondary | ICD-10-CM

## 2019-11-01 DIAGNOSIS — F411 Generalized anxiety disorder: Secondary | ICD-10-CM | POA: Diagnosis not present

## 2019-11-01 DIAGNOSIS — F331 Major depressive disorder, recurrent, moderate: Secondary | ICD-10-CM | POA: Diagnosis not present

## 2019-11-01 MED ORDER — LAMOTRIGINE 100 MG PO TABS: ORAL_TABLET | ORAL | 2 refills | Status: DC

## 2019-11-01 NOTE — Progress Notes (Signed)
Alicia Phillips [id number] 05-05-1981 39 y.o.  Virtual Visit via Telephone Note  I connected with pt on 11/01/19 at  2:00 PM EDT by telephone and verified that I am speaking with the correct person using two identifiers.   I discussed the limitations, risks, security and privacy concerns of performing an evaluation and management service by telephone and the availability of in person appointments. I also discussed with the patient that there may be a patient responsible charge related to this service. The patient expressed understanding and agreed to proceed.   I discussed the assessment and treatment plan with the patient. The patient was provided an opportunity to ask questions and all were answered. The patient agreed with the plan and demonstrated an understanding of the instructions.   The patient was advised to call back or seek an in-person evaluation if the symptoms worsen or if the condition fails to improve as anticipated.  I provided 30 minutes of non-face-to-face time during this encounter.  The patient was located at home.  The provider was located at [facility].   Dorothyann Gibbs, NP   Subjective:   Patient ID:  Alicia Phillips is a 39 y.o. (DOB 08-17-1980) female.  Chief Complaint: No chief complaint on file.   HPI [name] presents for follow-up of GAD, MDD, Borderline personality disorder, BPD 2.  Describes mood today as "not very good". Pleasant. Tearful. Mood symptoms - reports depression, anxiety, and irritability. Stating "I'm not doing so well". In a "valley of emptiness". Stating "I have a really severe case". Has some workbooks - meeting with therapist. Wanting to focus on the "borderline" issues she has. Can tell a difference with addition of Lamictal and would like to increase the dose. In a "valley of emptiness". Has been isolating except with local family members. Sugars have been "ok". Varying interest and motivation. Seeing therapist.  Taking medications as prescribed.  Energy levels low. Active, has a regular exercise routine. Walking daily and Yoga 4 x days. Works full-time Film/video editor. Enjoys some usual interests and activities. Single. Lives alone. Teacher - virtual. Spending time with family - Mother, brother, and sister local.  Appetite adequate. Weight gain.  Sleeps well most nights. Averages 6 hours. Naps during the day.  Focus and concentration stable. Completing tasks. Managing aspects of household. Work going well - "pretty complicated". Not wanting to go to work - "hard to get motivated". Has been able to function at work.  Denies SI or HI. Denies AH or VH.   Previous medications: Latuda, Abilify, Prozac   Review of Systems:  Review of Systems  Musculoskeletal: Negative for gait problem.  Neurological: Negative for tremors.  Psychiatric/Behavioral:       Please refer to HPI    Medications: I have reviewed the patient's current medications.  Current Outpatient Medications  Medication Sig Dispense Refill  . ACETAMINOPHEN EXTRA STRENGTH 500 MG tablet     . busPIRone (BUSPAR) 10 MG tablet     . clindamycin (CLEOCIN T) 1 % external solution Apply 1 application topically 2 (two) times daily.    . insulin aspart (NOVOLOG) 100 UNIT/ML injection Inject into the skin 3 (three) times daily before meals.    . lamoTRIgine (LAMICTAL) 25 MG tablet Take one tablet at bedtime for 14 days, then take two tablets at bedtime. 60 tablet 2  . lurasidone (LATUDA) 20 MG TABS tablet Take 1 tablet (20 mg total) by mouth daily. 30 tablet 2  . NOVOLOG PENFILL cartridge     .  spironolactone (ALDACTONE) 50 MG tablet Take 2 tablet by mouth once a day  take 1 tablet for 1 weeks, then increase to 2 daily if tolerated    . venlafaxine XR (EFFEXOR-XR) 150 MG 24 hr capsule      No current facility-administered medications for this visit.    Medication Side Effects: None  Allergies:  Allergies  Allergen Reactions  . Septra  [Sulfamethoxazole-Trimethoprim]     No past medical history on file.  No family history on file.  Social History   Socioeconomic History  . Marital status: Single    Spouse name: Not on file  . Number of children: Not on file  . Years of education: Not on file  . Highest education level: Not on file  Occupational History  . Not on file  Tobacco Use  . Smoking status: Never Smoker  . Smokeless tobacco: Never Used  Substance and Sexual Activity  . Alcohol use: Not on file  . Drug use: Not on file  . Sexual activity: Not on file  Other Topics Concern  . Not on file  Social History Narrative  . Not on file   Social Determinants of Health   Financial Resource Strain:   . Difficulty of Paying Living Expenses:   Food Insecurity:   . Worried About Programme researcher, broadcasting/film/video in the Last Year:   . Barista in the Last Year:   Transportation Needs:   . Freight forwarder (Medical):   Marland Kitchen Lack of Transportation (Non-Medical):   Physical Activity:   . Days of Exercise per Week:   . Minutes of Exercise per Session:   Stress:   . Feeling of Stress :   Social Connections:   . Frequency of Communication with Friends and Family:   . Frequency of Social Gatherings with Friends and Family:   . Attends Religious Services:   . Active Member of Clubs or Organizations:   . Attends Banker Meetings:   Marland Kitchen Marital Status:   Intimate Partner Violence:   . Fear of Current or Ex-Partner:   . Emotionally Abused:   Marland Kitchen Physically Abused:   . Sexually Abused:     Past Medical History, Surgical history, Social history, and Family history were reviewed and updated as appropriate.   Please see review of systems for further details on the patient's review from today.   Objective:   Physical Exam:  There were no vitals taken for this visit.  Physical Exam Constitutional:      General: She is not in acute distress. Musculoskeletal:        General: No deformity.   Neurological:     Mental Status: She is alert and oriented to person, place, and time.     Coordination: Coordination normal.  Psychiatric:        Attention and Perception: Attention and perception normal. She does not perceive auditory or visual hallucinations.        Mood and Affect: Mood is anxious and depressed. Affect is not labile, blunt, angry or inappropriate.        Speech: Speech normal.        Behavior: Behavior normal.        Thought Content: Thought content normal. Thought content is not paranoid or delusional. Thought content does not include homicidal or suicidal ideation. Thought content does not include homicidal or suicidal plan.        Cognition and Memory: Cognition and memory normal.  Judgment: Judgment normal.     Comments: Insight intact     Lab Review:  No results found for: NA, K, CL, CO2, GLUCOSE, BUN, CREATININE, CALCIUM, PROT, ALBUMIN, AST, ALT, ALKPHOS, BILITOT, GFRNONAA, GFRAA  No results found for: WBC, RBC, HGB, HCT, PLT, MCV, MCH, MCHC, RDW, LYMPHSABS, MONOABS, EOSABS, BASOSABS  No results found for: POCLITH, LITHIUM   No results found for: PHENYTOIN, PHENOBARB, VALPROATE, CBMZ   .res Assessment: Plan:    Plan:  1. Effexor XR 225mg  daily 2. Buspar 10mg  BID 3. Increase Lamictal 50mg  daily to 100mg  daily  Therapy with Sammuel Cooper  RTC 4 weeks  Patient advised to contact office with any questions, adverse effects, or acute worsening in signs and symptoms.  Counseled patient regarding potential benefits, risks, and side effects of Lamictal to include potential risk of Stevens-Johnson syndrome. Advised patient to stop taking Lamictal and contact office immediately if rash develops and to seek urgent medical attention if rash is severe and/or spreading quickly.  There are no diagnoses linked to this encounter.  Please see After Visit Summary for patient specific instructions.  No future appointments.  No orders of the defined types were  placed in this encounter.     -------------------------------

## 2019-11-10 DIAGNOSIS — E109 Type 1 diabetes mellitus without complications: Secondary | ICD-10-CM | POA: Diagnosis not present

## 2019-11-10 DIAGNOSIS — L732 Hidradenitis suppurativa: Secondary | ICD-10-CM | POA: Diagnosis not present

## 2019-11-17 DIAGNOSIS — L738 Other specified follicular disorders: Secondary | ICD-10-CM | POA: Diagnosis not present

## 2019-11-17 DIAGNOSIS — L732 Hidradenitis suppurativa: Secondary | ICD-10-CM | POA: Diagnosis not present

## 2019-11-17 DIAGNOSIS — B360 Pityriasis versicolor: Secondary | ICD-10-CM | POA: Diagnosis not present

## 2019-11-30 DIAGNOSIS — L732 Hidradenitis suppurativa: Secondary | ICD-10-CM | POA: Diagnosis not present

## 2020-01-25 ENCOUNTER — Other Ambulatory Visit: Payer: Self-pay

## 2020-01-25 DIAGNOSIS — F331 Major depressive disorder, recurrent, moderate: Secondary | ICD-10-CM

## 2020-01-25 DIAGNOSIS — F3181 Bipolar II disorder: Secondary | ICD-10-CM

## 2020-02-01 ENCOUNTER — Other Ambulatory Visit: Payer: Self-pay

## 2020-02-01 ENCOUNTER — Telehealth: Payer: Self-pay | Admitting: Adult Health

## 2020-02-01 DIAGNOSIS — F3181 Bipolar II disorder: Secondary | ICD-10-CM

## 2020-02-01 DIAGNOSIS — F331 Major depressive disorder, recurrent, moderate: Secondary | ICD-10-CM

## 2020-02-01 MED ORDER — LAMOTRIGINE 100 MG PO TABS: ORAL_TABLET | ORAL | 2 refills | Status: DC

## 2020-02-01 NOTE — Telephone Encounter (Signed)
Pt stopped by. Had put in a RF request on Friday to her pharmacy for Lamictal. She is out of the med and wishes to get it sent in for RF today. [facility] on [address] on file.

## 2020-02-01 NOTE — Telephone Encounter (Signed)
Refill sent.

## 2020-02-28 DIAGNOSIS — H43393 Other vitreous opacities, bilateral: Secondary | ICD-10-CM | POA: Diagnosis not present

## 2020-02-28 DIAGNOSIS — E103513 Type 1 diabetes mellitus with proliferative diabetic retinopathy with macular edema, bilateral: Secondary | ICD-10-CM | POA: Diagnosis not present

## 2020-02-28 DIAGNOSIS — H35371 Puckering of macula, right eye: Secondary | ICD-10-CM | POA: Diagnosis not present

## 2020-02-29 DIAGNOSIS — E103511 Type 1 diabetes mellitus with proliferative diabetic retinopathy with macular edema, right eye: Secondary | ICD-10-CM | POA: Diagnosis not present

## 2020-03-07 DIAGNOSIS — E103512 Type 1 diabetes mellitus with proliferative diabetic retinopathy with macular edema, left eye: Secondary | ICD-10-CM | POA: Diagnosis not present

## 2020-03-09 DIAGNOSIS — Z20828 Contact with and (suspected) exposure to other viral communicable diseases: Secondary | ICD-10-CM | POA: Diagnosis not present

## 2020-03-22 DIAGNOSIS — Z20828 Contact with and (suspected) exposure to other viral communicable diseases: Secondary | ICD-10-CM | POA: Diagnosis not present

## 2020-03-29 DIAGNOSIS — M549 Dorsalgia, unspecified: Secondary | ICD-10-CM | POA: Diagnosis not present

## 2020-03-29 DIAGNOSIS — R5381 Other malaise: Secondary | ICD-10-CM | POA: Diagnosis not present

## 2020-03-29 DIAGNOSIS — R5383 Other fatigue: Secondary | ICD-10-CM | POA: Diagnosis not present

## 2020-04-04 ENCOUNTER — Emergency Department (HOSPITAL_COMMUNITY): Payer: BC Managed Care – PPO

## 2020-04-04 ENCOUNTER — Other Ambulatory Visit: Payer: Self-pay

## 2020-04-04 ENCOUNTER — Encounter (HOSPITAL_COMMUNITY): Payer: Self-pay

## 2020-04-04 ENCOUNTER — Emergency Department (HOSPITAL_COMMUNITY)
Admission: EM | Admit: 2020-04-04 | Discharge: 2020-04-05 | Disposition: A | Discharge status: Home / Self Care | Payer: BC Managed Care – PPO | Source: Home / Self Care

## 2020-04-04 DIAGNOSIS — Z794 Long term (current) use of insulin: Secondary | ICD-10-CM | POA: Diagnosis not present

## 2020-04-04 DIAGNOSIS — R000 Tachycardia, unspecified: Secondary | ICD-10-CM | POA: Diagnosis not present

## 2020-04-04 DIAGNOSIS — Z5321 Procedure and treatment not carried out due to patient leaving prior to being seen by health care provider: Secondary | ICD-10-CM | POA: Insufficient documentation

## 2020-04-04 DIAGNOSIS — F411 Generalized anxiety disorder: Secondary | ICD-10-CM | POA: Diagnosis not present

## 2020-04-04 DIAGNOSIS — M62830 Muscle spasm of back: Secondary | ICD-10-CM | POA: Insufficient documentation

## 2020-04-04 DIAGNOSIS — I2109 ST elevation (STEMI) myocardial infarction involving other coronary artery of anterior wall: Secondary | ICD-10-CM | POA: Diagnosis not present

## 2020-04-04 DIAGNOSIS — R079 Chest pain, unspecified: Secondary | ICD-10-CM | POA: Diagnosis not present

## 2020-04-04 DIAGNOSIS — Z882 Allergy status to sulfonamides status: Secondary | ICD-10-CM | POA: Diagnosis not present

## 2020-04-04 DIAGNOSIS — Z20822 Contact with and (suspected) exposure to covid-19: Secondary | ICD-10-CM | POA: Diagnosis not present

## 2020-04-04 DIAGNOSIS — R778 Other specified abnormalities of plasma proteins: Secondary | ICD-10-CM | POA: Diagnosis not present

## 2020-04-04 DIAGNOSIS — Z23 Encounter for immunization: Secondary | ICD-10-CM | POA: Diagnosis not present

## 2020-04-04 DIAGNOSIS — F17210 Nicotine dependence, cigarettes, uncomplicated: Secondary | ICD-10-CM | POA: Diagnosis not present

## 2020-04-04 DIAGNOSIS — I214 Non-ST elevation (NSTEMI) myocardial infarction: Secondary | ICD-10-CM | POA: Diagnosis not present

## 2020-04-04 DIAGNOSIS — Z79899 Other long term (current) drug therapy: Secondary | ICD-10-CM | POA: Diagnosis not present

## 2020-04-04 DIAGNOSIS — R7989 Other specified abnormal findings of blood chemistry: Secondary | ICD-10-CM | POA: Diagnosis not present

## 2020-04-04 DIAGNOSIS — Z720 Tobacco use: Secondary | ICD-10-CM | POA: Diagnosis not present

## 2020-04-04 DIAGNOSIS — F329 Major depressive disorder, single episode, unspecified: Secondary | ICD-10-CM | POA: Diagnosis not present

## 2020-04-04 DIAGNOSIS — I2102 ST elevation (STEMI) myocardial infarction involving left anterior descending coronary artery: Secondary | ICD-10-CM | POA: Diagnosis not present

## 2020-04-04 DIAGNOSIS — R0789 Other chest pain: Secondary | ICD-10-CM | POA: Diagnosis not present

## 2020-04-04 DIAGNOSIS — R002 Palpitations: Secondary | ICD-10-CM | POA: Diagnosis not present

## 2020-04-04 DIAGNOSIS — E782 Mixed hyperlipidemia: Secondary | ICD-10-CM | POA: Diagnosis not present

## 2020-04-04 DIAGNOSIS — F332 Major depressive disorder, recurrent severe without psychotic features: Secondary | ICD-10-CM | POA: Diagnosis not present

## 2020-04-04 DIAGNOSIS — E109 Type 1 diabetes mellitus without complications: Secondary | ICD-10-CM | POA: Diagnosis not present

## 2020-04-04 DIAGNOSIS — I2510 Atherosclerotic heart disease of native coronary artery without angina pectoris: Secondary | ICD-10-CM | POA: Diagnosis not present

## 2020-04-04 LAB — URINALYSIS, ROUTINE W REFLEX MICROSCOPIC
Bacteria, UA: NONE SEEN
Bilirubin Urine: NEGATIVE
Glucose, UA: NEGATIVE mg/dL
Ketones, ur: NEGATIVE mg/dL
Leukocytes,Ua: NEGATIVE
Nitrite: NEGATIVE
Protein, ur: NEGATIVE mg/dL
Specific Gravity, Urine: 1.002 — ABNORMAL LOW (ref 1.005–1.030)
pH: 6 (ref 5.0–8.0)

## 2020-04-04 LAB — CBC
HCT: 42.3 % (ref 36.0–46.0)
Hemoglobin: 14.1 g/dL (ref 12.0–15.0)
MCH: 30.7 pg (ref 26.0–34.0)
MCHC: 33.3 g/dL (ref 30.0–36.0)
MCV: 92.2 fL (ref 80.0–100.0)
Platelets: 404 10*3/uL — ABNORMAL HIGH (ref 150–400)
RBC: 4.59 MIL/uL (ref 3.87–5.11)
RDW: 12.6 % (ref 11.5–15.5)
WBC: 10.2 10*3/uL (ref 4.0–10.5)
nRBC: 0 % (ref 0.0–0.2)

## 2020-04-04 NOTE — ED Triage Notes (Signed)
Pt complains of irregular heart palpitations for one hour, she states that she's had intermittent back spasms for three weeks Pt also states that it's hard to breathe especially when laying down

## 2020-04-05 ENCOUNTER — Inpatient Hospital Stay (HOSPITAL_COMMUNITY)
Admission: EM | Disposition: A | Discharge status: Home / Self Care | Payer: Self-pay | Source: Home / Self Care | Attending: Cardiovascular Disease

## 2020-04-05 ENCOUNTER — Other Ambulatory Visit: Payer: Self-pay

## 2020-04-05 ENCOUNTER — Encounter (HOSPITAL_COMMUNITY): Payer: Self-pay | Admitting: *Deleted

## 2020-04-05 ENCOUNTER — Emergency Department (HOSPITAL_COMMUNITY): Payer: BC Managed Care – PPO

## 2020-04-05 ENCOUNTER — Inpatient Hospital Stay (HOSPITAL_COMMUNITY)
Admission: EM | Admit: 2020-04-05 | Discharge: 2020-04-06 | DRG: 247 | Disposition: A | Discharge status: Home / Self Care | Payer: BC Managed Care – PPO | Attending: Cardiovascular Disease | Admitting: Cardiovascular Disease

## 2020-04-05 DIAGNOSIS — E785 Hyperlipidemia, unspecified: Secondary | ICD-10-CM

## 2020-04-05 DIAGNOSIS — I2510 Atherosclerotic heart disease of native coronary artery without angina pectoris: Secondary | ICD-10-CM | POA: Diagnosis present

## 2020-04-05 DIAGNOSIS — Z79899 Other long term (current) drug therapy: Secondary | ICD-10-CM

## 2020-04-05 DIAGNOSIS — I2109 ST elevation (STEMI) myocardial infarction involving other coronary artery of anterior wall: Principal | ICD-10-CM | POA: Diagnosis present

## 2020-04-05 DIAGNOSIS — I213 ST elevation (STEMI) myocardial infarction of unspecified site: Secondary | ICD-10-CM | POA: Diagnosis present

## 2020-04-05 DIAGNOSIS — Z882 Allergy status to sulfonamides status: Secondary | ICD-10-CM | POA: Diagnosis not present

## 2020-04-05 DIAGNOSIS — Z794 Long term (current) use of insulin: Secondary | ICD-10-CM | POA: Diagnosis not present

## 2020-04-05 DIAGNOSIS — Z955 Presence of coronary angioplasty implant and graft: Secondary | ICD-10-CM

## 2020-04-05 DIAGNOSIS — R079 Chest pain, unspecified: Secondary | ICD-10-CM

## 2020-04-05 DIAGNOSIS — R778 Other specified abnormalities of plasma proteins: Secondary | ICD-10-CM

## 2020-04-05 DIAGNOSIS — Z20822 Contact with and (suspected) exposure to covid-19: Secondary | ICD-10-CM | POA: Diagnosis present

## 2020-04-05 DIAGNOSIS — F17210 Nicotine dependence, cigarettes, uncomplicated: Secondary | ICD-10-CM | POA: Diagnosis present

## 2020-04-05 DIAGNOSIS — I2102 ST elevation (STEMI) myocardial infarction involving left anterior descending coronary artery: Secondary | ICD-10-CM | POA: Diagnosis not present

## 2020-04-05 DIAGNOSIS — F411 Generalized anxiety disorder: Secondary | ICD-10-CM | POA: Diagnosis present

## 2020-04-05 DIAGNOSIS — F329 Major depressive disorder, single episode, unspecified: Secondary | ICD-10-CM | POA: Diagnosis present

## 2020-04-05 DIAGNOSIS — I214 Non-ST elevation (NSTEMI) myocardial infarction: Secondary | ICD-10-CM

## 2020-04-05 DIAGNOSIS — Z720 Tobacco use: Secondary | ICD-10-CM | POA: Diagnosis not present

## 2020-04-05 DIAGNOSIS — E109 Type 1 diabetes mellitus without complications: Secondary | ICD-10-CM

## 2020-04-05 DIAGNOSIS — E782 Mixed hyperlipidemia: Secondary | ICD-10-CM | POA: Diagnosis not present

## 2020-04-05 DIAGNOSIS — Z23 Encounter for immunization: Secondary | ICD-10-CM | POA: Diagnosis not present

## 2020-04-05 HISTORY — DX: Atherosclerotic heart disease of native coronary artery without angina pectoris: I25.10

## 2020-04-05 HISTORY — DX: ST elevation (STEMI) myocardial infarction of unspecified site: I21.3

## 2020-04-05 HISTORY — DX: Type 1 diabetes mellitus without complications: E10.9

## 2020-04-05 HISTORY — PX: LEFT HEART CATH AND CORONARY ANGIOGRAPHY: CATH118249

## 2020-04-05 HISTORY — DX: Hyperlipidemia, unspecified: E78.5

## 2020-04-05 HISTORY — DX: Major depressive disorder, single episode, unspecified: F32.9

## 2020-04-05 HISTORY — DX: Anxiety disorder, unspecified: F41.9

## 2020-04-05 HISTORY — DX: Tobacco use: Z72.0

## 2020-04-05 HISTORY — PX: CORONARY STENT INTERVENTION: CATH118234

## 2020-04-05 LAB — I-STAT BETA HCG BLOOD, ED (MC, WL, AP ONLY): I-stat hCG, quantitative: <5 m[IU]/mL (ref <5)

## 2020-04-05 LAB — SARS CORONAVIRUS 2 BY RT PCR (HOSPITAL ORDER, PERFORMED IN ~~LOC~~ HOSPITAL LAB): SARS Coronavirus 2: NEGATIVE

## 2020-04-05 LAB — CBC WITH DIFFERENTIAL/PLATELET
Abs Immature Granulocytes: 0.1 10*3/uL — ABNORMAL HIGH (ref 0.00–0.07)
Basophils Absolute: 0.1 10*3/uL (ref 0.0–0.1)
Basophils Relative: 1 %
Eosinophils Absolute: 0.2 10*3/uL (ref 0.0–0.5)
Eosinophils Relative: 2 %
HCT: 42.3 % (ref 36.0–46.0)
Hemoglobin: 14.1 g/dL (ref 12.0–15.0)
Immature Granulocytes: 1 %
Lymphocytes Relative: 23 %
Lymphs Abs: 2.1 10*3/uL (ref 0.7–4.0)
MCH: 30.7 pg (ref 26.0–34.0)
MCHC: 33.3 g/dL (ref 30.0–36.0)
MCV: 92.2 fL (ref 80.0–100.0)
Monocytes Absolute: 0.6 10*3/uL (ref 0.1–1.0)
Monocytes Relative: 6 %
Neutro Abs: 6.1 10*3/uL (ref 1.7–7.7)
Neutrophils Relative %: 67 %
Platelets: 390 10*3/uL (ref 150–400)
RBC: 4.59 MIL/uL (ref 3.87–5.11)
RDW: 12.7 % (ref 11.5–15.5)
WBC: 9.1 10*3/uL (ref 4.0–10.5)
nRBC: 0 % (ref 0.0–0.2)

## 2020-04-05 LAB — COMPREHENSIVE METABOLIC PANEL
ALT: 12 U/L (ref 0–44)
AST: 16 U/L (ref 15–41)
Albumin: 4.2 g/dL (ref 3.5–5.0)
Alkaline Phosphatase: 85 U/L (ref 38–126)
Anion gap: 9 (ref 5–15)
BUN: 5 mg/dL — ABNORMAL LOW (ref 6–20)
CO2: 23 mmol/L (ref 22–32)
Calcium: 9.3 mg/dL (ref 8.9–10.3)
Chloride: 103 mmol/L (ref 98–111)
Creatinine, Ser: 0.55 mg/dL (ref 0.44–1.00)
GFR calc Af Amer: >60 mL/min (ref >60)
GFR calc non Af Amer: >60 mL/min (ref >60)
Glucose, Bld: 205 mg/dL — ABNORMAL HIGH (ref 70–99)
Potassium: 3.9 mmol/L (ref 3.5–5.1)
Sodium: 135 mmol/L (ref 135–145)
Total Bilirubin: 0.5 mg/dL (ref 0.3–1.2)
Total Protein: 7.9 g/dL (ref 6.5–8.1)

## 2020-04-05 LAB — CARDIAC CATHETERIZATION: Cath EF Quantitative: 45 %

## 2020-04-05 LAB — GLUCOSE, CAPILLARY
Glucose-Capillary: 87 mg/dL (ref 70–99)
Glucose-Capillary: 89 mg/dL (ref 70–99)
Glucose-Capillary: 90 mg/dL (ref 70–99)

## 2020-04-05 LAB — BASIC METABOLIC PANEL
Anion gap: 12 (ref 5–15)
BUN: 7 mg/dL (ref 6–20)
CO2: 21 mmol/L — ABNORMAL LOW (ref 22–32)
Calcium: 9.7 mg/dL (ref 8.9–10.3)
Chloride: 106 mmol/L (ref 98–111)
Creatinine, Ser: 0.65 mg/dL (ref 0.44–1.00)
GFR calc Af Amer: >60 mL/min (ref >60)
GFR calc non Af Amer: >60 mL/min (ref >60)
Glucose, Bld: 168 mg/dL — ABNORMAL HIGH (ref 70–99)
Potassium: 3.8 mmol/L (ref 3.5–5.1)
Sodium: 139 mmol/L (ref 135–145)

## 2020-04-05 LAB — PROTIME-INR
INR: 0.9 (ref 0.8–1.2)
Prothrombin Time: 12 seconds (ref 11.4–15.2)

## 2020-04-05 LAB — I-STAT BETA HCG BLOOD, ED (NOT ORDERABLE): I-stat hCG, quantitative: <5 m[IU]/mL (ref <5)

## 2020-04-05 LAB — TROPONIN I (HIGH SENSITIVITY)
Troponin I (High Sensitivity): 30 ng/L — ABNORMAL HIGH (ref <18)
Troponin I (High Sensitivity): 646 ng/L (ref <18)
Troponin I (High Sensitivity): 977 ng/L (ref <18)

## 2020-04-05 LAB — HEPARIN LEVEL (UNFRACTIONATED): Heparin Unfractionated: <0.1 IU/mL — ABNORMAL LOW (ref 0.30–0.70)

## 2020-04-05 LAB — POCT ACTIVATED CLOTTING TIME: Activated Clotting Time: 246 seconds

## 2020-04-05 LAB — APTT: aPTT: 25 seconds (ref 24–36)

## 2020-04-05 SURGERY — LEFT HEART CATH AND CORONARY ANGIOGRAPHY
Anesthesia: LOCAL

## 2020-04-05 MED ORDER — VERAPAMIL HCL 2.5 MG/ML IV SOLN
INTRAVENOUS | Status: DC | PRN
  Administered 2020-04-05: 10 mL via INTRA_ARTERIAL

## 2020-04-05 MED ORDER — HEPARIN (PORCINE) IN NACL 1000-0.9 UT/500ML-% IV SOLN
INTRAVENOUS | Status: AC
  Filled 2020-04-05: qty 1000

## 2020-04-05 MED ORDER — LAMOTRIGINE 100 MG PO TABS
100.0000 mg | ORAL_TABLET | Freq: Every day | ORAL | Status: DC
  Administered 2020-04-05: 100 mg via ORAL
  Filled 2020-04-05: qty 1

## 2020-04-05 MED ORDER — ASPIRIN 81 MG PO CHEW: 81.0000 mg | CHEWABLE_TABLET | ORAL | Status: DC

## 2020-04-05 MED ORDER — TICAGRELOR 90 MG PO TABS
90.0000 mg | ORAL_TABLET | Freq: Two times a day (BID) | ORAL | Status: DC
  Administered 2020-04-06 (×2): 90 mg via ORAL
  Filled 2020-04-05 (×2): qty 1

## 2020-04-05 MED ORDER — HEPARIN (PORCINE) 25000 UT/250ML-% IV SOLN
900.0000 [IU]/h | INTRAVENOUS | Status: DC
  Administered 2020-04-05: 900 [IU]/h via INTRAVENOUS
  Filled 2020-04-05: qty 250

## 2020-04-05 MED ORDER — ONDANSETRON HCL 4 MG/2ML IJ SOLN: 4.0000 mg | Freq: Four times a day (QID) | INTRAMUSCULAR | Status: DC | PRN

## 2020-04-05 MED ORDER — NITROGLYCERIN 1 MG/10 ML FOR IR/CATH LAB
INTRA_ARTERIAL | Status: DC | PRN
  Administered 2020-04-05: 200 ug via INTRA_ARTERIAL
  Administered 2020-04-05 (×2): 100 ug via INTRACORONARY

## 2020-04-05 MED ORDER — PNEUMOCOCCAL VAC POLYVALENT 25 MCG/0.5ML IJ INJ
0.5000 mL | INJECTION | INTRAMUSCULAR | Status: AC
  Administered 2020-04-06: 0.5 mL via INTRAMUSCULAR
  Filled 2020-04-05: qty 0.5

## 2020-04-05 MED ORDER — MIDAZOLAM HCL 2 MG/2ML IJ SOLN
INTRAMUSCULAR | Status: AC
  Filled 2020-04-05: qty 2

## 2020-04-05 MED ORDER — ATORVASTATIN CALCIUM 80 MG PO TABS
80.0000 mg | ORAL_TABLET | Freq: Every day | ORAL | Status: DC
  Administered 2020-04-05: 80 mg via ORAL
  Filled 2020-04-05 (×2): qty 1

## 2020-04-05 MED ORDER — ASPIRIN 81 MG PO CHEW
324.0000 mg | CHEWABLE_TABLET | Freq: Once | ORAL | Status: AC
  Administered 2020-04-05: 324 mg via ORAL
  Filled 2020-04-05: qty 4

## 2020-04-05 MED ORDER — HEPARIN SODIUM (PORCINE) 1000 UNIT/ML IJ SOLN
INTRAMUSCULAR | Status: DC | PRN
  Administered 2020-04-05: 4000 [IU] via INTRAVENOUS
  Administered 2020-04-05: 2000 [IU] via INTRAVENOUS
  Administered 2020-04-05: 4000 [IU] via INTRAVENOUS

## 2020-04-05 MED ORDER — SODIUM CHLORIDE 0.9 % WEIGHT BASED INFUSION: 1.0000 mL/kg/h | INTRAVENOUS | Status: AC

## 2020-04-05 MED ORDER — TICAGRELOR 90 MG PO TABS
ORAL_TABLET | ORAL | Status: AC
  Filled 2020-04-05: qty 1

## 2020-04-05 MED ORDER — SODIUM CHLORIDE 0.9 % WEIGHT BASED INFUSION
3.0000 mL/kg/h | INTRAVENOUS | Status: DC
  Administered 2020-04-05: 3 mL/kg/h via INTRAVENOUS

## 2020-04-05 MED ORDER — TICAGRELOR 90 MG PO TABS
ORAL_TABLET | ORAL | Status: DC | PRN
  Administered 2020-04-05: 180 mg via ORAL

## 2020-04-05 MED ORDER — LIDOCAINE HCL (PF) 1 % IJ SOLN
INTRAMUSCULAR | Status: AC
  Filled 2020-04-05: qty 30

## 2020-04-05 MED ORDER — FENTANYL CITRATE (PF) 100 MCG/2ML IJ SOLN
INTRAMUSCULAR | Status: DC | PRN
Start: 2020-04-05 — End: 2020-04-05
  Administered 2020-04-05 (×4): 25 ug via INTRAVENOUS

## 2020-04-05 MED ORDER — ACETAMINOPHEN 500 MG PO TABS: 500.0000 mg | ORAL_TABLET | ORAL | Status: DC | PRN

## 2020-04-05 MED ORDER — FENTANYL CITRATE (PF) 100 MCG/2ML IJ SOLN
INTRAMUSCULAR | Status: AC
  Filled 2020-04-05: qty 2

## 2020-04-05 MED ORDER — VERAPAMIL HCL 2.5 MG/ML IV SOLN
INTRAVENOUS | Status: AC
  Filled 2020-04-05: qty 2

## 2020-04-05 MED ORDER — IOHEXOL 350 MG/ML SOLN
INTRAVENOUS | Status: DC | PRN
  Administered 2020-04-05: 130 mL via INTRA_ARTERIAL

## 2020-04-05 MED ORDER — IOHEXOL 350 MG/ML SOLN
75.0000 mL | Freq: Once | INTRAVENOUS | Status: AC | PRN
  Administered 2020-04-05: 75 mL via INTRAVENOUS

## 2020-04-05 MED ORDER — MIDAZOLAM HCL 2 MG/2ML IJ SOLN
INTRAMUSCULAR | Status: DC | PRN
  Administered 2020-04-05 (×4): 1 mg via INTRAVENOUS

## 2020-04-05 MED ORDER — SODIUM CHLORIDE 0.9 % IV SOLN
INTRAVENOUS | Status: AC | PRN
  Administered 2020-04-05: 10 mL/h via INTRAVENOUS

## 2020-04-05 MED ORDER — ASPIRIN 81 MG PO CHEW
81.0000 mg | CHEWABLE_TABLET | Freq: Every day | ORAL | Status: DC
  Administered 2020-04-06: 81 mg via ORAL
  Filled 2020-04-05: qty 1

## 2020-04-05 MED ORDER — HEPARIN (PORCINE) IN NACL 1000-0.9 UT/500ML-% IV SOLN
INTRAVENOUS | Status: DC | PRN
  Administered 2020-04-05: 500 mL

## 2020-04-05 MED ORDER — SODIUM CHLORIDE 0.9% FLUSH: 3.0000 mL | INTRAVENOUS | Status: DC | PRN

## 2020-04-05 MED ORDER — CARVEDILOL 3.125 MG PO TABS
3.1250 mg | ORAL_TABLET | Freq: Two times a day (BID) | ORAL | Status: DC
  Administered 2020-04-05: 3.125 mg via ORAL
  Filled 2020-04-05: qty 1

## 2020-04-05 MED ORDER — SODIUM CHLORIDE 0.9 % WEIGHT BASED INFUSION: 1.0000 mL/kg/h | INTRAVENOUS | Status: DC

## 2020-04-05 MED ORDER — METOPROLOL TARTRATE 25 MG PO TABS: 12.5000 mg | ORAL_TABLET | Freq: Two times a day (BID) | ORAL | Status: DC

## 2020-04-05 MED ORDER — INSULIN ASPART 100 UNIT/ML ~~LOC~~ SOLN: 0.0000 [IU] | Freq: Three times a day (TID) | SUBCUTANEOUS | Status: DC

## 2020-04-05 MED ORDER — HEPARIN SODIUM (PORCINE) 1000 UNIT/ML IJ SOLN
INTRAMUSCULAR | Status: AC
  Filled 2020-04-05: qty 1

## 2020-04-05 MED ORDER — INSULIN DEGLUDEC 100 UNIT/ML ~~LOC~~ SOPN: 25.0000 [IU] | PEN_INJECTOR | Freq: Every day | SUBCUTANEOUS | Status: DC

## 2020-04-05 MED ORDER — INSULIN ASPART 100 UNIT/ML ~~LOC~~ SOLN
0.0000 [IU] | Freq: Three times a day (TID) | SUBCUTANEOUS | Status: DC
  Administered 2020-04-06: 3 [IU] via SUBCUTANEOUS
  Administered 2020-04-06: 5 [IU] via SUBCUTANEOUS

## 2020-04-05 MED ORDER — INSULIN GLARGINE 100 UNIT/ML ~~LOC~~ SOLN
25.0000 [IU] | SUBCUTANEOUS | Status: DC
  Administered 2020-04-06: 18 [IU] via SUBCUTANEOUS
  Filled 2020-04-05 (×2): qty 0.25

## 2020-04-05 MED ORDER — SODIUM CHLORIDE 0.9% FLUSH: 3.0000 mL | Freq: Two times a day (BID) | INTRAVENOUS | Status: DC

## 2020-04-05 MED ORDER — HEPARIN BOLUS VIA INFUSION
4000.0000 [IU] | INTRAVENOUS | Status: AC
  Administered 2020-04-05: 4000 [IU] via INTRAVENOUS
  Filled 2020-04-05: qty 4000

## 2020-04-05 MED ORDER — SODIUM CHLORIDE 0.9 % IV SOLN: 250.0000 mL | INTRAVENOUS | Status: DC | PRN

## 2020-04-05 MED ORDER — NITROGLYCERIN 1 MG/10 ML FOR IR/CATH LAB
INTRA_ARTERIAL | Status: AC
  Filled 2020-04-05: qty 10

## 2020-04-05 SURGICAL SUPPLY — 21 items
BALLN SAPPHIRE 2.0X12 (BALLOONS) ×2
BALLN SAPPHIRE ~~LOC~~ 2.5X15 (BALLOONS) ×2 IMPLANT
BALLN ~~LOC~~ EUPHORA RX 2.75X15 (BALLOONS) ×2
BALLOON SAPPHIRE 2.0X12 (BALLOONS) ×1 IMPLANT
BALLOON ~~LOC~~ EUPHORA RX 2.75X15 (BALLOONS) ×1 IMPLANT
CATH 5FR JL3.5 JR4 ANG PIG MP (CATHETERS) ×2 IMPLANT
CATH INFINITI 4FR JL3.5 (CATHETERS) ×2 IMPLANT
CATH LAUNCHER 5F EBU3.5 (CATHETERS) ×2 IMPLANT
DEVICE RAD COMP TR BAND LRG (VASCULAR PRODUCTS) ×2 IMPLANT
GLIDESHEATH SLEND SS 6F .021 (SHEATH) ×2 IMPLANT
GUIDEWIRE INQWIRE 1.5J.035X260 (WIRE) ×1 IMPLANT
INQWIRE 1.5J .035X260CM (WIRE) ×2
KIT ENCORE 26 ADVANTAGE (KITS) ×2 IMPLANT
KIT HEART LEFT (KITS) ×2 IMPLANT
PACK CARDIAC CATHETERIZATION (CUSTOM PROCEDURE TRAY) ×2 IMPLANT
STENT RESOLUTE ONYX 2.25X18 (Permanent Stent) ×2 IMPLANT
SYR MEDRAD MARK 7 150ML (SYRINGE) ×2 IMPLANT
TRANSDUCER W/STOPCOCK (MISCELLANEOUS) ×2 IMPLANT
TUBING CIL FLEX 10 FLL-RA (TUBING) ×2 IMPLANT
WIRE RUNTHROUGH .014X180CM (WIRE) ×2 IMPLANT
WIRE RUNTHROUGH .014X300CM (WIRE) IMPLANT

## 2020-04-05 NOTE — ED Notes (Signed)
[facility] called for transport. Tied up at the moment but will come as soon as they can.

## 2020-04-05 NOTE — ED Provider Notes (Addendum)
[facility]-EMERGENCY DEPT Provider Note   CSN: [id number] Arrival date & time: 04/05/20  0754     History Chief Complaint  Patient presents with  . Shortness of Breath  . Irr HR    Alicia Phillips is a 39 y.o. female.  HPI     39 year old female with history of type 1 diabetes, depression and anxiety presents with concern for chest, back, and left arm discomfort that have been waxing and waning over the last several weeks with palpitations last night.  She had checked into the emergency department last night, but due to weight went home.  She was called and told that there is an abnormality in her lab work and she should return.   Reports that she has had intermittent episodes primarily of thoracic back "spasms" and spasms in her left upper arm which have been present primarily in the evenings-while she is cooking or doing yoga.  Denies specific exertional symptoms.  Reports that yesterday she began to have the symptoms of palpitations in her chest with associated tightness.  She had those last night which brought her to the emergency department.  Denies specific shortness of breath, but reports she has a difficult time taking a deep breath.  Currently not having chest pain, reports last sensation in chest was on way here  Not on estrogen, no recent surgery or travel.  No family history of DVT, PE, early coronary artery disease.    Did start smoking cigarettes again, twice a week/occasional etoh, occ mj  Glucose has been in 200s. No associated lightheadedness, nausea, diaphoresis, leg swelling or pain, no cough or fever. Having menstrual cramps.  She is vaccinated against COVID 19.     Patient Active Problem List   Diagnosis Date Noted  . Depressive disorder 12/06/2016  . Diabetes mellitus (HCC) 11/04/2016    History reviewed. No pertinent surgical history.   OB History   No obstetric history on file.     No family history on file.  Social  History   Tobacco Use  . Smoking status: Current Every Day Smoker  . Smokeless tobacco: Never Used  Substance Use Topics  . Alcohol use: Never  . Drug use: Never    Home Medications Prior to Admission medications   Medication Sig Start Date End Date Taking? Authorizing Provider  ACETAMINOPHEN EXTRA STRENGTH 500 MG tablet Take 500 mg by mouth every 4 (four) hours as needed for mild pain or headache.  05/06/19  Yes [provider]  cyclobenzaprine (FLEXERIL) 10 MG tablet Take 10 mg by mouth at bedtime as needed for sleep. 03/29/20  Yes [provider]  doxycycline (VIBRAMYCIN) 50 MG capsule Take 50 mg by mouth daily. 03/06/20  Yes [provider]  insulin aspart (NOVOLOG) 100 UNIT/ML injection Inject 0-20 Units into the skin 3 (three) times daily before meals. Per sliding scale   Yes [provider]  lamoTRIgine (LAMICTAL) 100 MG tablet Take one tablet at bedtime. Patient taking differently: Take 100 mg by mouth daily. Take one tablet at bedtime. 02/01/20  Yes Mozingo, Thereasa Solo, NP  TRESIBA FLEXTOUCH 100 UNIT/ML FlexTouch Pen Inject 25 Units into the skin daily.  04/03/20  Yes [provider]    Allergies    Sulfamethoxazole-trimethoprim  Review of Systems   Review of Systems  Constitutional: Positive for fatigue. Negative for fever.  HENT: Negative for sore throat.   Eyes: Negative for visual disturbance.  Respiratory: Positive for shortness of breath. Negative for  cough.   Cardiovascular: Positive for chest pain.  Gastrointestinal: Negative for abdominal pain, nausea and vomiting.  Genitourinary: Negative for difficulty urinating.  Musculoskeletal: Positive for back pain and myalgias. Negative for neck pain.  Skin: Negative for rash.  Neurological: Negative for syncope and headaches.    Physical Exam Updated Vital Signs BP (!) 127/94   Pulse (!) 104   Temp 97.9 F (36.6 C) (Oral)   Resp 17   Ht 5' 7.5" (1.715 m)   Wt 75.8 kg    LMP 04/04/2020 (Exact Date)   SpO2 100%   BMI 25.77 kg/m   Physical Exam Vitals and nursing note reviewed.  Constitutional:      General: She is not in acute distress.    Appearance: She is well-developed. She is not diaphoretic.  HENT:     Head: Normocephalic and atraumatic.  Eyes:     Conjunctiva/sclera: Conjunctivae normal.  Cardiovascular:     Rate and Rhythm: Normal rate and regular rhythm.     Heart sounds: Normal heart sounds. No murmur heard.  No friction rub. No gallop.   Pulmonary:     Effort: Pulmonary effort is normal. No respiratory distress.     Breath sounds: Normal breath sounds. No wheezing or rales.  Abdominal:     General: There is no distension.     Palpations: Abdomen is soft.     Tenderness: There is no abdominal tenderness. There is no guarding.  Musculoskeletal:        General: No tenderness.     Cervical back: Normal range of motion.  Skin:    General: Skin is warm and dry.     Findings: No erythema or rash.  Neurological:     Mental Status: She is alert and oriented to person, place, and time.     ED Results / Procedures / Treatments   Labs (all labs ordered are listed, but only abnormal results are displayed) Labs Reviewed  CBC WITH DIFFERENTIAL/PLATELET - Abnormal; Notable for the following components:      Result Value   Abs Immature Granulocytes 0.10 (*)    All other components within normal limits  COMPREHENSIVE METABOLIC PANEL - Abnormal; Notable for the following components:   Glucose, Bld 205 (*)    BUN 5 (*)    All other components within normal limits  TROPONIN I (HIGH SENSITIVITY) - Abnormal; Notable for the following components:   Troponin I (High Sensitivity) 646 (*)    All other components within normal limits  SARS CORONAVIRUS 2 BY RT PCR (HOSPITAL ORDER, PERFORMED IN  HOSPITAL LAB)  HEPARIN LEVEL (UNFRACTIONATED)  PROTIME-INR  APTT  HEPARIN LEVEL (UNFRACTIONATED)  I-STAT BETA HCG BLOOD, ED ([facility], [facility], [facility] ONLY)    TROPONIN I (HIGH SENSITIVITY)    EKG EKG Interpretation  Date/Time:  Wednesday April 05 2020 08:07:50 EDT Ventricular Rate:  114 PR Interval:    QRS Duration: 86 QT Interval:  323 QTC Calculation: 445 R Axis:   67 Text Interpretation: Sinus tachycardia Right atrial enlargement Minimal ST depression, inferior leads V2 with elevation, less than 65mm elevation V4/V5, TW inversions aVL, more pronounced in comparison to prior ECG last night Confirmed by Alvira Monday ([id number]) on 04/05/2020 10:50:47 AM   Radiology DG Chest 2 View  Result Date: 04/05/2020 CLINICAL DATA:  Chest pain and shortness of breath, tachycardia EXAM: CHEST - 2 VIEW COMPARISON:  04/04/2020 FINDINGS: The heart size and mediastinal contours are within normal limits. Both lungs are clear. The visualized  skeletal structures are unremarkable. IMPRESSION: No active cardiopulmonary disease. Electronically Signed   By: Alcide Clever M.D.   On: 04/05/2020 08:35   DG Chest 2 View  Result Date: 04/04/2020 CLINICAL DATA:  Palpitation EXAM: CHEST - 2 VIEW COMPARISON:  None. FINDINGS: The heart size and mediastinal contours are within normal limits. Both lungs are clear. The visualized skeletal structures are unremarkable. IMPRESSION: No active cardiopulmonary disease. Electronically Signed   By: Jasmine Pang M.D.   On: 04/04/2020 23:01   CT Angio Chest PE W and/or Wo Contrast  Result Date: 04/05/2020 CLINICAL DATA:  Palpitations and chest pain EXAM: CT ANGIOGRAPHY CHEST WITH CONTRAST TECHNIQUE: Multidetector CT imaging of the chest was performed using the standard protocol during bolus administration of intravenous contrast. Multiplanar CT image reconstructions and MIPs were obtained to evaluate the vascular anatomy. CONTRAST:  50mL OMNIPAQUE IOHEXOL 350 MG/ML SOLN COMPARISON:  Chest x-ray from earlier in the same day. FINDINGS: Cardiovascular: Thoracic aorta is within normal limits without evidence of aneurysmal dilatation or  dissection. No significant atherosclerotic calcifications are seen. The pulmonary artery is well visualized within normal branching pattern. No intraluminal filling defect to suggest pulmonary embolus is identified. No cardiac enlargement is seen. No coronary calcifications are noted. No pericardial effusion is seen. Mediastinum/Nodes: Thoracic inlet is within normal limits. No hilar or mediastinal adenopathy is noted. The esophagus is within normal limits as visualized. Lungs/Pleura: Lungs are well aerated bilaterally. No focal infiltrate or sizable effusion is seen. No parenchymal nodules are noted. Upper Abdomen: Visualized upper abdomen is within normal limits. Musculoskeletal: No chest wall abnormality. No acute or significant osseous findings. Review of the MIP images confirms the above findings. IMPRESSION: No evidence of pulmonary emboli. No acute abnormality seen. Electronically Signed   By: Alcide Clever M.D.   On: 04/05/2020 09:44    Procedures .Critical Care Performed by: Alvira Monday, MD Authorized by: Alvira Monday, MD   Critical care provider statement:    Critical care time (minutes):  45   Critical care was time spent personally by me on the following activities:  Discussions with consultants, evaluation of patient's response to treatment, examination of patient, ordering and performing treatments and interventions, ordering and review of laboratory studies, ordering and review of radiographic studies, pulse oximetry, re-evaluation of patient's condition, obtaining history from patient or surrogate and review of old charts   (including critical care time)  Medications Ordered in ED Medications  heparin bolus via infusion 4,000 Units (has no administration in time range)  heparin ADULT infusion 100 units/mL (25000 units/282mL sodium chloride 0.45%) (has no administration in time range)  aspirin chewable tablet 81 mg (0 mg Oral Hold 04/05/20 1047)  0.9% sodium chloride infusion  (3 mL/kg/hr  75.8 kg Intravenous New Bag/Given 04/05/20 1047)    Followed by  0.9% sodium chloride infusion (has no administration in time range)  iohexol (OMNIPAQUE) 350 MG/ML injection 75 mL (75 mLs Intravenous Contrast Given 04/05/20 0920)  aspirin chewable tablet 324 mg (324 mg Oral Given 04/05/20 1000)    ED Course  I have reviewed the triage vital signs and the nursing notes.  Pertinent labs & imaging results that were available during my care of the patient were reviewed by me and considered in my medical decision making (see chart for details).    MDM Rules/Calculators/A&P                          39 year old female with history  of type 1 diabetes, depression and anxiety presents with concern for chest, back, and left arm discomfort that have been waxing and waning over the last several weeks with palpitations last night.  Differential diagnosis for chest pain includes pulmonary embolus, dissection, pneumothorax, pneumonia, ACS, myocarditis, pericarditis.  EKG was done and evaluate by me and showed elevation in V2, less than 1mm elevation lateral leads, slight inferior depression, does not meet STEMI criteria and no signs of pericarditis. She is also currently chest pain free. Chest x-ray was done and evaluated by me and radiology and showed no sign of pneumonia or pneumothorax.    Troponin completed last night is slightly elevated at 30.  CT PE study completed showing no sign of PE. Repeat troponin 600s concerning for NSTEMI. DIscussed with Cardiology/Dr. Allyson SabalBerry. Agree that in absence of continuing chest pain or clear STE will not call code stemi but [facility] emergent Cath and admission.  Given aspirin, heparin ordered. [facility]   Final Clinical Impression(s) / ED Diagnoses Final diagnoses:  Chest pain, unspecified type  Elevated troponin  NSTEMI (non-ST elevated myocardial infarction) [facility](HCC)    Rx / DC Orders ED Discharge Orders      None         Alvira MondaySchlossman, Rondo Spittler, MD 04/05/20 1052

## 2020-04-05 NOTE — ED Notes (Signed)
Date and time results received: 04/05/20 9:52 AM  (use smartphrase ".now" to insert current time)  Test: Troponin Critical Value: 8  Name of Provider Notified: Dr. Dalene Seltzer  Orders Received? Or Actions Taken?:

## 2020-04-05 NOTE — Progress Notes (Signed)
ANTICOAGULATION CONSULT NOTE - Initial Consult  Pharmacy Consult for Heparin Indication: chest pain/ACS  Allergies  Allergen Reactions  . Sulfamethoxazole-Trimethoprim Rash    Patient Measurements: Height: 5' 7.5" (171.5 cm) Weight: 75.8 kg (167 lb) IBW/kg (Calculated) : 62.75 Heparin Dosing Weight: TBW  Vital Signs: Temp: 97.9 F (36.6 C) (09/08 0803) Temp Source: Oral (09/08 0803) BP: 161/98 (09/08 0803) Pulse Rate: 119 (09/08 0803)  Labs: Recent Labs    04/04/20 2312 04/05/20 0908  HGB 14.1 14.1  HCT 42.3 42.3  PLT 404* 390  CREATININE 0.65 0.55  TROPONINIHS 30* 646*    Estimated Creatinine Clearance: 101.4 mL/min (by C-G formula based on SCr of 0.55 mg/dL).   Medical History: History reviewed. No pertinent past medical history.  Medications:  (Not in a hospital admission)  Scheduled:  Infusions:   Assessment: 54 yoF presented to ED on 9/8 with chest pain, palpitations, and troponin elevation.  Pharmacy is consulted to dose Heparin. No PTA anticoagulation CTa: negative for PE Troponin: 30, 646 CBC: Hgb and Plt wnl Baseline coags pending No bleeding or complications reported  Goal of Therapy:  Heparin level 0.3-0.7 units/ml Monitor platelets by anticoagulation protocol: Yes   Plan:  Baseline PTT, PT/INR Give heparin 4000 units bolus IV stat Start heparin IV infusion at 900 units/hr Heparin level 6 hours after starting Daily heparin level and CBC Continue to monitor H&H and platelets   Lynann Beaver PharmD, BCPS Clinical Pharmacist [facility] main pharmacy [phone] 04/05/2020 10:17 AM

## 2020-04-05 NOTE — ED Notes (Signed)
Pt not out in the lobby when called, her cell phone has been called twice with two messages left for her to return to the ED for further testing

## 2020-04-05 NOTE — ED Notes (Signed)
[facility] called @ 10:19 am

## 2020-04-05 NOTE — ED Triage Notes (Signed)
Pt returned after receiving a call to return to ED. ? For a CTA

## 2020-04-05 NOTE — ED Notes (Signed)
Walked patient to the bathroom patient did well 

## 2020-04-05 NOTE — H&P (Addendum)
Cardiology Admission History and Physical:   Patient ID: Alicia Phillips MRN: [id number]; DOB: [birth year]   Admission date: 04/05/2020  Primary Care Provider: Aliene Beams, MD [facility] Cardiologist: Nanetta Batty, MD [facility] Electrophysiologist:  None   Chief Complaint:  NSTEMI  Patient Profile:   Alicia Phillips is a 38 y.o. female with no prior cardiac history, but with DM type 1, hidradenitis suppurativa, current tobacco use, occasional marijuana use, MDD, and GAD presented to [facility] with chest pain, palpitations, and subsequent troponin elevation.   History of Present Illness:   Alicia Phillips with the above PMH presented to the ER last evening for dyspnea and back spasms for the past 3 weeks. She had one hs troponin drawn and then left ER. She was reached by her personal cell phone and told to return to the ER for further workup. CTA was negative for PE and no coronary calcifications seen.   HS troponin increased > 600. Cardiology was consulted for admission for NSTEMI.  On my interview, she reports a three week history of back muscle spasms on both sides of her spine at the level of her shoulder blades. She also started having left arm pain from her shoulder to her elbow that coincided with her back pain. Episodes were severe in the 8-9/10 range and lasted several hours at time. Generally in the evenings, not correlated with activity. Episodes have been associated with nasuea, vomiting, shortness of breath, and diaphoresis. 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 Last evening, she felt her heart skipping beats - this was associated with another occurrence of back pain and left arm pain.   Of note, she got a third moderna covid vaccination on 03/24/20 given her autoimmune disorders. No recent illness. No heart disease in her parents. She has three siblings, all without premature heart disease.  She smoked from [age], quit, but recently started back. She is a Engineer, site.    Past Medical History:  Diagnosis Date   Anxiety    Diabetes mellitus type 1 (HCC)    Major depression    Smoker     History reviewed. No pertinent surgical history.   Medications Prior to Admission: Prior to Admission medications   Medication Sig Start Date End Date Taking? Authorizing Provider  ACETAMINOPHEN EXTRA STRENGTH 500 MG tablet Take 500 mg by mouth every 4 (four) hours as needed for mild pain or headache.  05/06/19  Yes [provider]  cyclobenzaprine (FLEXERIL) 10 MG tablet Take 10 mg by mouth at bedtime as needed for sleep. 03/29/20  Yes [provider]  doxycycline (VIBRAMYCIN) 50 MG capsule Take 50 mg by mouth daily. 03/06/20  Yes [provider]  insulin aspart (NOVOLOG) 100 UNIT/ML injection Inject 0-20 Units into the skin 3 (three) times daily before meals. Per sliding scale   Yes [provider]  lamoTRIgine (LAMICTAL) 100 MG tablet Take one tablet at bedtime. Patient taking differently: Take 100 mg by mouth daily. Take one tablet at bedtime. 02/01/20  Yes Mozingo, Thereasa Solo, NP  TRESIBA FLEXTOUCH 100 UNIT/ML FlexTouch Pen Inject 25 Units into the skin daily.  04/03/20  Yes [provider]     Allergies:    Allergies  Allergen Reactions   Sulfamethoxazole-Trimethoprim Rash    Social History:   Social History   Socioeconomic History   Marital status: Single    Spouse name:  Not on file   Number of children: Not on file   Years of education: Not on file   Highest education level: Not on file  Occupational History   Not on file  Tobacco Use   Smoking status: Current Every Day Smoker   Smokeless tobacco: Never Used  Substance and Sexual Activity   Alcohol use: Never   Drug use: Never   Sexual activity: Not on file  Other Topics Concern   Not on file  Social History Narrative   Not on file   Social Determinants of Health    Financial Resource Strain:    Difficulty of Paying Living Expenses: Not on file  Food Insecurity:    Worried About Running Out of Food in the Last Year: Not on file   Ran Out of Food in the Last Year: Not on file  Transportation Needs:    Lack of Transportation (Medical): Not on file   Lack of Transportation (Non-Medical): Not on file  Physical Activity:    Days of Exercise per Week: Not on file   Minutes of Exercise per Session: Not on file  Stress:    Feeling of Stress : Not on file  Social Connections:    Frequency of Communication with Friends and Family: Not on file   Frequency of Social Gatherings with Friends and Family: Not on file   Attends Religious Services: Not on file   Active Member of Clubs or Organizations: Not on file   Attends Banker Meetings: Not on file   Marital Status: Not on file  Intimate Partner Violence:    Fear of Current or Ex-Partner: Not on file   Emotionally Abused: Not on file   Physically Abused: Not on file   Sexually Abused: Not on file    Family History:   The patient's family history includes Breast cancer in her maternal grandmother.    ROS:  Please see the history of present illness.  All other ROS reviewed and negative.     Physical Exam/Data:   Vitals:   04/05/20 1000 04/05/20 1015 04/05/20 1030 04/05/20 1045  BP:    (!) 139/102  Pulse: (!) 110 (!) 108 (!) 110 (!) 119  Resp: 14 11 19  (!) 25  Temp:      TempSrc:      SpO2: 100% 100% 100% 100%  Weight:      Height:       No intake or output data in the 24 hours ending 04/05/20 1103 Last 3 Weights 04/05/2020 04/04/2020 06/21/2019  Weight (lbs) 167 lb 167 lb 184 lb  Weight (kg) 75.751 kg 75.751 kg 83.462 kg     Body mass index is 25.77 kg/m.  General:  Well nourished, well developed, in no acute distress, patient is anxious and tearful Neck: no JVD Endocrine:  No thryomegaly Vascular: No carotid bruits  Cardiac:  normal S1, S2; regular rhythm, tachycardic  rate Lungs:  clear to auscultation bilaterally, no wheezing, rhonchi or rales  Abd: soft, nontender, no hepatomegaly  Ext: no edema Musculoskeletal:  No deformities, BUE and BLE strength normal and equal Skin: warm and dry  Neuro:  CNs 2-12 intact, no focal abnormalities noted Psych:  Normal affect    EKG:  The ECG that was done was personally reviewed and demonstrates sinus tachycardia HR 114, ST depression inferior leads, ST elevation V2   Relevant CV Studies:  Heart cath today  Laboratory Data:  High Sensitivity Troponin:   Recent Labs  Lab  04/04/20 2312 04/05/20 0908  TROPONINIHS 30* 646*      Chemistry Recent Labs  Lab 04/04/20 2312 04/05/20 0908  NA 139 135  K 3.8 3.9  CL 106 103  CO2 21* 23  GLUCOSE 168* 205*  BUN 7 5*  CREATININE 0.65 0.55  CALCIUM 9.7 9.3  GFRNONAA >60 >60  GFRAA >60 >60  ANIONGAP 12 9    Recent Labs  Lab 04/05/20 0908  PROT 7.9  ALBUMIN 4.2  AST 16  ALT 12  ALKPHOS 85  BILITOT 0.5   Hematology Recent Labs  Lab 04/04/20 2312 04/05/20 0908  WBC 10.2 9.1  RBC 4.59 4.59  HGB 14.1 14.1  HCT 42.3 42.3  MCV 92.2 92.2  MCH 30.7 30.7  MCHC 33.3 33.3  RDW 12.6 12.7  PLT 404* 390   BNPNo results for input(s): BNP, PROBNP in the last 168 hours.  DDimer No results for input(s): DDIMER in the last 168 hours.   Radiology/Studies:  DG Chest 2 View  Result Date: 04/05/2020 CLINICAL DATA:  Chest pain and shortness of breath, tachycardia EXAM: CHEST - 2 VIEW COMPARISON:  04/04/2020 FINDINGS: The heart size and mediastinal contours are within normal limits. Both lungs are clear. The visualized skeletal structures are unremarkable. IMPRESSION: No active cardiopulmonary disease. Electronically Signed   By: Alcide Clever M.D.   On: 04/05/2020 08:35   DG Chest 2 View  Result Date: 04/04/2020 CLINICAL DATA:  Palpitation EXAM: CHEST - 2 VIEW COMPARISON:  None. FINDINGS: The heart size and mediastinal contours are within normal limits. Both  lungs are clear. The visualized skeletal structures are unremarkable. IMPRESSION: No active cardiopulmonary disease. Electronically Signed   By: Jasmine Pang M.D.   On: 04/04/2020 23:01   CT Angio Chest PE W and/or Wo Contrast  Result Date: 04/05/2020 CLINICAL DATA:  Palpitations and chest pain EXAM: CT ANGIOGRAPHY CHEST WITH CONTRAST TECHNIQUE: Multidetector CT imaging of the chest was performed using the standard protocol during bolus administration of intravenous contrast. Multiplanar CT image reconstructions and MIPs were obtained to evaluate the vascular anatomy. CONTRAST:  44mL OMNIPAQUE IOHEXOL 350 MG/ML SOLN COMPARISON:  Chest x-ray from earlier in the same day. FINDINGS: Cardiovascular: Thoracic aorta is within normal limits without evidence of aneurysmal dilatation or dissection. No significant atherosclerotic calcifications are seen. The pulmonary artery is well visualized within normal branching pattern. No intraluminal filling defect to suggest pulmonary embolus is identified. No cardiac enlargement is seen. No coronary calcifications are noted. No pericardial effusion is seen. Mediastinum/Nodes: Thoracic inlet is within normal limits. No hilar or mediastinal adenopathy is noted. The esophagus is within normal limits as visualized. Lungs/Pleura: Lungs are well aerated bilaterally. No focal infiltrate or sizable effusion is seen. No parenchymal nodules are noted. Upper Abdomen: Visualized upper abdomen is within normal limits. Musculoskeletal: No chest wall abnormality. No acute or significant osseous findings. Review of the MIP images confirms the above findings. IMPRESSION: No evidence of pulmonary emboli. No acute abnormality seen. Electronically Signed   By: Alcide Clever M.D.   On: 04/05/2020 09:44       TIMI Risk Score for Unstable Angina or Non-ST Elevation MI:   The patient's TIMI risk score is 3, which indicates a 13% risk of all cause mortality, new or recurrent myocardial infarction or  need for urgent revascularization in the next 14 days.   Assessment and Plan:   NSTEMI - hs troponin 30 --> 646 - risk factors include DM1, autoimmune disorder, smoking history - patient  presents with a three week history of back pain associated with left arm pain, N/V, diaphoresis, and dyspnea, symptoms progressed last night to include papitations - given her risk factors and symptoms concerning for her anginal equivalent, will plan for definitive angiography today - ASA administered - heparin drip started - she is mildly tachycardic, but also very anxious and tearfull - anticipate starting BB after case  The patient understands that risks included but are not limited to stroke (1 in 1000), death (1 in 1000), kidney failure [usually temporary] (1 in 500), bleeding (1 in 200), allergic reaction [possibly serious] (1 in 200).     Type 1 DM - on novolog and tresiba - will need consult to medicine service to help manage her DM   Current smoker - encouraged cessation   Anxiety and depression - maintained on lamictal   COVID-19 vaccination status - second vaccination November 09, 2019 - third vaccination 03/24/20    Severity of Illness: The appropriate patient status for this patient is INPATIENT. Inpatient status is judged to be reasonable and necessary in order to provide the required intensity of service to ensure the patient's safety. The patient's presenting symptoms, physical exam findings, and initial radiographic and laboratory data in the context of their chronic comorbidities is felt to place them at high risk for further clinical deterioration. Furthermore, it is not anticipated that the patient will be medically stable for discharge from the hospital within 2 midnights of admission. The following factors support the patient status of inpatient.   " The patient's presenting symptoms include back pain, arm pain, palpitations, SOB, diaphoresis, N/V. " The worrisome physical  exam findings include EKG changes. " The initial radiographic and laboratory data are worrisome because of EKG changes. " The chronic co-morbidities include smoking, autoimmune disorder, DM.   * I certify that at the point of admission it is my clinical judgment that the patient will require inpatient hospital care spanning beyond 2 midnights from the point of admission due to high intensity of service, high risk for further deterioration and high frequency of surveillance required.*    For questions or updates, please contact [facility] Please consult [email or web address] for contact info under     Signed, Marcelino Dusterngela Nicole Duke, PA  04/05/2020 11:03 AM    Agree with note by Micah FlesherAngela Duke PA-C  39 year old mildly overweight single Caucasian female with risk factors include type 1 diabetes since 1986, ongoing tobacco abuse, who has had 3 weeks of atypical back pain with left upper extremity radiation.  She had intermittent chest tightness.  She noticed palpitations last night and [facility] and her work-up was remarkable for initial negative troponin which rose to 600 after the second troponin was drawn.  EKG showed sinus tachycardia without acute ST or T wave changes.  Exam is benign.  She is on IV heparin.  I am concerned with her atypical symptoms and positive enzymes.  She may have SCAD, versus Takotsubo syndrome.  Alternatively, given her type 1 diabetes she is certainly present premature atherosclerosis.   I have reviewed the risks, indications, and alternatives to cardiac catheterization, possible angioplasty, and stenting with the patient. Risks include but are not limited to bleeding, infection, vascular injury, stroke, myocardial infection, arrhythmia, kidney injury, radiation-related injury in the case of prolonged fluoroscopy use, emergency cardiac surgery, and death. The patient understands the risks of serious complication is 1-2 in 1000 with  diagnostic cardiac cath and 1-2% or less  with angioplasty/stenting.    Khalia Gong J. Brayden Betters, M.D., FACP, [facility], [facility] [address] [place], [place]  [address]  [phone] 04/05/2020 12:07 PM

## 2020-04-05 NOTE — ED Notes (Signed)
Lab called to add on blue top ?

## 2020-04-05 NOTE — ED Notes (Signed)
Spoke to Clydie Braun, Agricultural consultant at [employer] and gave report and updated that patient will be doing a ED to ED transfer.

## 2020-04-05 NOTE — Interval H&P Note (Signed)
History and Physical Interval Note:  04/05/2020 3:44 PMCath Lab Visit (complete for each Cath Lab visit)  Clinical Evaluation Leading to the Procedure:   ACS: Yes.    Non-ACS:  n/a      MAYO OWCZARZAK  has presented today for surgery, with the diagnosis of nstemi.  The various methods of treatment have been discussed with the patient and family. After consideration of risks, benefits and other options for treatment, the patient has consented to  Procedure(s): LEFT HEART CATH AND CORONARY ANGIOGRAPHY (N/A) as a surgical intervention.  The patient's history has been reviewed, patient examined, no change in status, stable for surgery.  I have reviewed the patient's chart and labs.  Questions were answered to the patient's satisfaction.     Lorine Bears

## 2020-04-05 NOTE — ED Provider Notes (Signed)
Pt presents in transfer from [facility].  Pt has some cp and palpitations and elevated troponin.  Pt was d/w Dr. Allyson Sabal who recommended transfer to [facility] for cath.   Pt feels a little anxious, but no current chest pain.  Cardiology notified and will see her in the ED.    Jacalyn Lefevre, MD 04/07/20 1513

## 2020-04-06 ENCOUNTER — Encounter (HOSPITAL_COMMUNITY): Payer: Self-pay | Admitting: Cardiovascular Disease

## 2020-04-06 ENCOUNTER — Inpatient Hospital Stay (HOSPITAL_COMMUNITY): Payer: BC Managed Care – PPO

## 2020-04-06 DIAGNOSIS — I214 Non-ST elevation (NSTEMI) myocardial infarction: Secondary | ICD-10-CM

## 2020-04-06 DIAGNOSIS — I2510 Atherosclerotic heart disease of native coronary artery without angina pectoris: Secondary | ICD-10-CM

## 2020-04-06 DIAGNOSIS — E782 Mixed hyperlipidemia: Secondary | ICD-10-CM

## 2020-04-06 DIAGNOSIS — E785 Hyperlipidemia, unspecified: Secondary | ICD-10-CM

## 2020-04-06 DIAGNOSIS — Z720 Tobacco use: Secondary | ICD-10-CM

## 2020-04-06 DIAGNOSIS — I2102 ST elevation (STEMI) myocardial infarction involving left anterior descending coronary artery: Secondary | ICD-10-CM

## 2020-04-06 LAB — GLUCOSE, CAPILLARY
Glucose-Capillary: 221 mg/dL — ABNORMAL HIGH (ref 70–99)
Glucose-Capillary: 234 mg/dL — ABNORMAL HIGH (ref 70–99)
Glucose-Capillary: 223 mg/dL — ABNORMAL HIGH (ref 70–99)
Glucose-Capillary: 155 mg/dL — ABNORMAL HIGH (ref 70–99)

## 2020-04-06 LAB — CBC
HCT: 37.4 % (ref 36.0–46.0)
Hemoglobin: 12.7 g/dL (ref 12.0–15.0)
MCH: 30.9 pg (ref 26.0–34.0)
MCHC: 34 g/dL (ref 30.0–36.0)
MCV: 91 fL (ref 80.0–100.0)
Platelets: 378 10*3/uL (ref 150–400)
RBC: 4.11 MIL/uL (ref 3.87–5.11)
RDW: 12.6 % (ref 11.5–15.5)
WBC: 9 10*3/uL (ref 4.0–10.5)
nRBC: 0 % (ref 0.0–0.2)

## 2020-04-06 LAB — HEMOGLOBIN A1C
Hgb A1c MFr Bld: 8.6 % — ABNORMAL HIGH (ref 4.8–5.6)
Mean Plasma Glucose: 200.12 mg/dL

## 2020-04-06 LAB — LIPID PANEL
Cholesterol: 193 mg/dL (ref 0–200)
HDL: 50 mg/dL (ref >40)
LDL Cholesterol: 124 mg/dL — ABNORMAL HIGH (ref 0–99)
Total CHOL/HDL Ratio: 3.9 RATIO
Triglycerides: 96 mg/dL (ref <150)
VLDL: 19 mg/dL (ref 0–40)

## 2020-04-06 LAB — BASIC METABOLIC PANEL
Anion gap: 9 (ref 5–15)
BUN: <5 mg/dL — ABNORMAL LOW (ref 6–20)
CO2: 22 mmol/L (ref 22–32)
Calcium: 8.8 mg/dL — ABNORMAL LOW (ref 8.9–10.3)
Chloride: 104 mmol/L (ref 98–111)
Creatinine, Ser: 0.63 mg/dL (ref 0.44–1.00)
GFR calc Af Amer: >60 mL/min (ref >60)
GFR calc non Af Amer: >60 mL/min (ref >60)
Glucose, Bld: 223 mg/dL — ABNORMAL HIGH (ref 70–99)
Potassium: 3.9 mmol/L (ref 3.5–5.1)
Sodium: 135 mmol/L (ref 135–145)

## 2020-04-06 LAB — ECHOCARDIOGRAM COMPLETE
Area-P 1/2: 8.92 cm2
Height: 67.5 in
S' Lateral: 2.5 cm
Weight: 2632 oz

## 2020-04-06 MED ORDER — TICAGRELOR 90 MG PO TABS: 90.0000 mg | ORAL_TABLET | Freq: Two times a day (BID) | ORAL | 11 refills | Status: DC

## 2020-04-06 MED ORDER — NITROGLYCERIN 0.4 MG SL SUBL: 0.4000 mg | SUBLINGUAL_TABLET | SUBLINGUAL | 2 refills | Status: DC | PRN

## 2020-04-06 MED ORDER — LIVING BETTER WITH HEART FAILURE BOOK: Freq: Once | Status: DC

## 2020-04-06 MED ORDER — CARVEDILOL 6.25 MG PO TABS
6.2500 mg | ORAL_TABLET | Freq: Two times a day (BID) | ORAL | Status: DC
  Administered 2020-04-06: 6.25 mg via ORAL
  Filled 2020-04-06: qty 1

## 2020-04-06 MED ORDER — ASPIRIN 81 MG PO CHEW: 81.0000 mg | CHEWABLE_TABLET | Freq: Every day | ORAL | Status: AC

## 2020-04-06 MED ORDER — CARVEDILOL 6.25 MG PO TABS: 6.2500 mg | ORAL_TABLET | Freq: Two times a day (BID) | ORAL | 2 refills | Status: DC

## 2020-04-06 MED ORDER — ATORVASTATIN CALCIUM 80 MG PO TABS: 80.0000 mg | ORAL_TABLET | Freq: Every day | ORAL | 2 refills | Status: DC

## 2020-04-06 MED FILL — ATORVASTATIN CALCIUM 80 MG: 80 | 30 days supply | Qty: 30 | Fill #0

## 2020-04-06 MED FILL — BRILINTA 90 MG TABLET: 90 | 30 days supply | Qty: 60 | Fill #0

## 2020-04-06 MED FILL — CARVEDILOL 6.25 MG TABLET: 6.25 | 30 days supply | Qty: 60 | Fill #0

## 2020-04-06 MED FILL — NITROGLYCERIN 0.4 MG TAB SL: 0.4 | 7 days supply | Qty: 25 | Fill #0

## 2020-04-06 MED FILL — Verapamil HCl IV Soln 2.5 MG/ML: INTRAVENOUS | Qty: 2 | Status: AC

## 2020-04-06 MED FILL — Lidocaine HCl Local Preservative Free (PF) Inj 1%: INTRAMUSCULAR | Qty: 30 | Status: AC

## 2020-04-06 NOTE — Care Management (Signed)
1312 04-06-20 Pt is aware of the Brilinta co pay cost. Brilinta discount card provided to patient to see if she is eligible for additional assistance.  Rudene Christians, BSN Case Manager

## 2020-04-06 NOTE — Progress Notes (Signed)
CARDIAC REHAB PHASE I   PRE:  Rate/Rhythm: 100 SR  BP:  Supine: 121/70  Sitting:   Standing:    SaO2:   MODE:  Ambulation: 800 ft   POST:  Rate/Rhythm: 117 ST  BP:  Supine: 134/87  Sitting:   Standing:    SaO2: 97%RA 0800-0945 Pt walked 800 ft on RA with steady gait and no CP. Cardiology aware of HR at 117. MI education completed with pt who voiced understanding. Stressed importance of brilinta with stent. Reviewed NTG use, MI restrictions, heart healthy and low carb food choices, walking for exercise, smoking cessation and CRP 2. Gave smoking cessation handout.  Encouraged to call 1800quitnow. Pt has quit in past and restarted due to stress. Pt close to tears at times during ed and emotional support given. 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 Pt would like referral to both [place] and [place] CRP 2. Will refer to both and let programs follow up to see which program is more conducive to her work schedule.   Luetta Nutting, RN BSN  04/06/2020 9:39 AM

## 2020-04-06 NOTE — Progress Notes (Signed)
  Echocardiogram 2D Echocardiogram has been performed.  Alicia Phillips 04/06/2020, 11:08 AM

## 2020-04-06 NOTE — TOC Benefit Eligibility Note (Signed)
Transition of Care [facility]) Benefit Eligibility Note    Patient Details  Name: Alicia Phillips MRN: [id number] Date of Birth: Dec 29, 1980   Medication/Dose: Brilinta 90 mg bid  Covered?: Yes     Prescription Coverage Preferred Pharmacy: any retail pharmacy  Spoke with Person/Company/Phone Number:: [employer]  Co-Pay: $35 for 30 day retail  Prior Approval: No     Additional Notes: patient is eligible for coupon card for rx    Orson Aloe Phone Number: 04/06/2020, 12:40 PM

## 2020-04-06 NOTE — Discharge Instructions (Signed)
Medication Changes: - START Aspirin 81mg  daily and Brilinta 90mg  twice daily. These medications are very important and help keep the new stent in your heart open. - START Carvedilol (Coreg) 6.25mg  twice daily. - START Atorvastatin (Lipitor) 80mg  daily for cholesterol. - Take sublingual Nitroglycerin as needed for chest pain.  Post STEMI: NO HEAVY LIFTING X4 WEEKS. NO SEXUAL ACTIVITY X4 WEEKS. NO DRIVING X1 WEEK. NO SOAKING BATHS, HOT TUBS, POOLS, ETC., X 7 DAYS.  Radial Site Care: Refer to this sheet in the next few weeks. These instructions provide you with information on caring for yourself after your procedure. Your caregiver may also give you more specific instructions. Your treatment has been planned according to current medical practices, but problems sometimes occur. Call your caregiver if you have any problems or questions after your procedure. HOME CARE INSTRUCTIONS  You may shower the day after the procedure.Remove the bandage (dressing) and gently wash the site with plain soap and water.Gently pat the site dry.   Do not apply powder or lotion to the site.   Do not submerge the affected site in water for 3 to 5 days.   Inspect the site at least twice daily.   Do not flex or bend the affected arm for 24 hours.   No lifting over 5 pounds (2.3 kg) for 5 days after your procedure.   Do not drive home if you are discharged the same day of the procedure. Have someone else drive you.  What to expect:  Any bruising will usually fade within 1 to 2 weeks.   Blood that collects in the tissue (hematoma) may be painful to the touch. It should usually decrease in size and tenderness within 1 to 2 weeks.  SEEK IMMEDIATE MEDICAL CARE IF:  You have unusual pain at the radial site.   You have redness, warmth, swelling, or pain at the radial site.   You have drainage (other than a small amount of blood on the dressing).   You have chills.   You have a fever or persistent symptoms  for more than 72 hours.   You have a fever and your symptoms suddenly get worse.   Your arm becomes pale, cool, tingly, or numb.   You have heavy bleeding from the site. Hold pressure on the site.

## 2020-04-06 NOTE — Progress Notes (Signed)
This chaplain responded to MD consult for Pt. Advance Directive education.  The Pt. is awake and sitting up in the bed.  The Pt. mother-Anita is bedside.  The chaplain completed AD education and understands the Pt. will be discharged today.  The chaplain explained how to complete AD outside the hospital and left the incomplete document with the Pt.  F/U spiritual care is available as needed.

## 2020-04-06 NOTE — Care Management (Signed)
1018 04-06-20 Benefits Check submitted for Brilinta. Case Manager will follow for cost.  Graves-Bigelow, Lamar Laundry, RN,BSN Case Manager

## 2020-04-06 NOTE — Discharge Summary (Addendum)
Discharge Summary    Patient ID: Alicia Phillips MRN: [id number]; DOB: 12/14/1980  Admit date: 04/05/2020 Discharge date: 04/06/2020  Primary Care Provider: Aliene Beams, MD  Primary Cardiologist: Nanetta Batty, MD  Primary Electrophysiologist:  None   Discharge Diagnoses    Principal Problem:   STEMI (ST elevation myocardial infarction) [place] [facility]) Active Problems:   CAD (coronary artery disease)   Hyperlipidemia   Type 1 diabetes mellitus (HCC)   Tobacco abuse    Diagnostic Studies/Procedures    Left Heart Catheterization 04/05/2020: There is mild left ventricular systolic dysfunction. LV end diastolic pressure is normal. Prox LAD to Mid LAD lesion is 30% stenosed. Mid LAD lesion is 95% stenosed. Post intervention, there is a 0% residual stenosis. A drug-eluting stent was successfully placed using a STENT RESOLUTE ONYX 2.25X18.   1.  Severe one-vessel coronary artery disease with 95% stenosis in the mid LAD just before the origin of the diagonal with evidence of thrombotic plaque rupture.  No other obstructive disease. 2.  Mildly reduced LV systolic function with distal and apical wall hypokinesis.  Normal LVEDP. 3.  Successful angioplasty and drug-eluting stent placement to the mid LAD.   Recommendations: Dual antiplatelet therapy for at least 1 year. Aggressive treatment of risk factors.  I added high-dose statin. We will add small dose carvedilol as well and if blood pressure tolerates, consider an ARB.  Diagnostic Dominance: Right  Intervention    _____________  Echocardiogram 04/06/2020: Impressions: 1. Left ventricular ejection fraction, by estimation, is 60 to 65%. The  left ventricle has normal function. The left ventricle has no regional  wall motion abnormalities. Left ventricular diastolic parameters were  normal.   2. Right ventricular systolic function is normal. The right ventricular  size is normal. Tricuspid regurgitation signal is inadequate for  assessing  PA pressure.   3. The mitral valve is normal in structure. No evidence of mitral valve  regurgitation. No evidence of mitral stenosis.   4. The aortic valve is tricuspid. Aortic valve regurgitation is not  visualized. No aortic stenosis is present.   5. The inferior vena cava is normal in size with greater than 50%  respiratory variability, suggesting right atrial pressure of 3 mmHg.   Comparison(s): No prior Echocardiogram.   Conclusion(s)/Recommendation(s): Normal biventricular function without  evidence of hemodynamically significant valvular heart disease.    History of Present Illness     Alicia Phillips is a 39 y.o. female with a history of type 1 diabetes mellitus, hidradenitis suppurativa, depression, anxiety, tobacco use, and and occasional marijuana use but no known cardiac history who was admitted on 04/05/2020 with NSTEMI.  Patient reported 3 week history of back muscle spasms on both side of her spine at the level of her shoulder blades. She also started having left arm pain from her shoulder to her elbow that coincided with her back pain. Episodes were severe in the 8-9/10 range and lasted several hours at time. These episodes generally occur in the evenings and are not correlated with activity. Episodes have been associated with nasuea, vomiting, shortness of breath, and diaphoresis. 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 On evening of 04/04/2020, she felt her heart skipping beats - this was associated with another occurrence of back pain and left arm pain.   Patient presented to the [facility] ED on 04/04/2020 for further evaluation of these symptoms. She had one  high-sensitivity troponin drawn and then left the ED due to prolonged wait. That troponin came back positive at 30 so she was called and advised to return to the ED for further work-up. Coronary CTA was negative for PE and no coronary  calcifications were seen. High-sensitivity troponin peaked at 977.   Of note, she got a third moderna covid vaccination on 03/24/20 given her autoimmune disorders. No recent illness. No heart disease in her parents. She has three siblings, all without premature heart disease. She smoked from [age] years old, quit, but recently started back. She is a Engineer, site.   Initial EKG on 04/04/2020 showed mild (<70mm) ST elevation in V2-V3. On repeat EKGs, she continued to have evolving EKG changes concerning to anterior STEMI with ST elevations in anterior lead and then biphasic T waves.   Hospital Course     Consultants: None  Anterior STEMI Patient admitted with STEMI as stated above. High-sensitivity troponin peaked at 977. Left heart catheterization showed 95% stenosis of mid LAD and 30% stenosis of proximal to mid LAD. Patient underwent successful PCI with DES to mid LAD lesion. Mild LV systolic dysfunction noted. LVEDP normal. Echo showed LVEF of 60-65% with normal wall motion and normal diastolic function. No significant valvular disease. Patient tolerated procedure well. Right radial cath site soft with no signs of hematoma. Renal function stable. Able to ambulate with Cardiac Rehab. She was started on dual antiplatelet therapy with Aspirin 81mg  daily and Brilinta 90mg  twice daily. Also started on Coreg 6.25mg  twice daily and Lipitor 80mg  daily.   Hyperlipidemia Lipid panel this admission: Total Cholesterol 193, Triglycerides 96, HDL 50, LDL 124. LDL goal <70 given CAD. Started on Lipitor 80mg  daily. Will need repeat lipid panel and LFTs in 6-8 weeks.  Type 1 Diabetes Mellitus Placed on sliding scale insulin and Lantus during admission. Can restart home regimen at discharge. She already has an appointment with her Endocrinologist scheduled for the next couple of weeks.  Tobacco Abuse Patient recently started smoking again. Emphasized the importance of complete cessation and patient understands.  Patient does not feel like she needs any Nicotine products at this time but would like helpful resources which we will provide.  Patient seen and examined by Dr. today and determined to be stable for discharge. Outpatient follow-up has been arranged. Medications as below. 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 Work note provided. Advised not to drive for 1 week.  Did the patient have an acute coronary syndrome (MI, NSTEMI, STEMI, etc) this admission?:  Yes                               AHA/ACC Clinical Performance & Quality Measures: Aspirin prescribed? - Yes ADP Receptor Inhibitor (Plavix/Clopidogrel, Brilinta/Ticagrelor or Effient/Prasugrel) prescribed (includes medically managed patients)? - Yes Beta Blocker prescribed? - Yes High Intensity Statin (Lipitor 40-80mg  or Crestor 20-40mg ) prescribed? - Yes EF assessed during THIS hospitalization? - Yes For EF <40%, was ACEI/ARB prescribed? - Not Applicable (EF >/= 40%) For EF <40%, Aldosterone Antagonist (Spironolactone or Eplerenone) prescribed? - Not Applicable (EF >/= 40%) Cardiac Rehab Phase II ordered (including medically managed patients)? - Yes   _____________  Discharge Vitals Blood pressure (P) 113/70, pulse (!) (P) 103, temperature (P) 98.2 F (36.8 C), temperature source (P) Oral, resp. rate (P) 19, height 5' 7.5" (1.715 m), weight 74.6 kg, last menstrual period 04/04/2020,  SpO2 98 %.  Filed Weights   04/05/20 0803 04/06/20 0445  Weight: 75.8 kg 74.6 kg   General: 39 y.o. female resting comfortably in no acute distress. HEENT: Normocephalic and atraumatic. Sclera clear.  Neck: Supple.  Heart: RRR. Distinct S1 and S2. No murmurs, gallops, or rubs. Right radial cath site soft with no signs of hematoma. Lungs: No increased work of breathing. Clear to ausculation bilaterally. No wheezes, rhonchi, or rales.  Abdomen: Soft, non-distended, and non-tender to palpation.    MSK: Normal strength and tone for age. Extremities: No lower extremity edema.    Skin: Warm and dry. Neuro: Alert and oriented x3. No focal deficits. Psych: Normal affect. Responds appropriately.  Labs & Radiologic Studies    CBC Recent Labs    04/05/20 0908 04/06/20 0832  WBC 9.1 9.0  NEUTROABS 6.1  --   HGB 14.1 12.7  HCT 42.3 37.4  MCV 92.2 91.0  PLT 390 378   Basic Metabolic Panel Recent Labs    60/45/40 0908 04/06/20 0832  NA 135 135  K 3.9 3.9  CL 103 104  CO2 23 22  GLUCOSE 205* 223*  BUN 5* <5*  CREATININE 0.55 0.63  CALCIUM 9.3 8.8*   Liver Function Tests Recent Labs    04/05/20 0908  AST 16  ALT 12  ALKPHOS 85  BILITOT 0.5  PROT 7.9  ALBUMIN 4.2   No results for input(s): LIPASE, AMYLASE in the last 72 hours. High Sensitivity Troponin:   Recent Labs  Lab 04/04/20 2312 04/05/20 0908 04/05/20 1046  TROPONINIHS 30* 646* 977*    BNP Invalid input(s): POCBNP D-Dimer No results for input(s): DDIMER in the last 72 hours. Hemoglobin A1C Recent Labs    04/06/20 0832  HGBA1C 8.6*   Fasting Lipid Panel Recent Labs    04/06/20 0832  CHOL 193  HDL 50  LDLCALC 124*  TRIG 96  CHOLHDL 3.9   Thyroid Function Tests No results for input(s): TSH, T4TOTAL, T3FREE, THYROIDAB in the last 72 hours.  Invalid input(s): FREET3 _____________  DG Chest 2 View  Result Date: 04/05/2020 CLINICAL DATA:  Chest pain and shortness of breath, tachycardia EXAM: CHEST - 2 VIEW COMPARISON:  04/04/2020 FINDINGS: The heart size and mediastinal contours are within normal limits. Both lungs are clear. The visualized skeletal structures are unremarkable. IMPRESSION: No active cardiopulmonary disease. Electronically Signed   By: Alcide Clever M.D.   On: 04/05/2020 08:35   DG Chest 2 View  Result Date: 04/04/2020 CLINICAL DATA:  Palpitation EXAM: CHEST - 2 VIEW COMPARISON:  None. FINDINGS: The heart size and mediastinal contours are within normal limits. Both lungs are  clear. The visualized skeletal structures are unremarkable. IMPRESSION: No active cardiopulmonary disease. Electronically Signed   By: Jasmine Pang M.D.   On: 04/04/2020 23:01   CT Angio Chest PE W and/or Wo Contrast  Result Date: 04/05/2020 CLINICAL DATA:  Palpitations and chest pain EXAM: CT ANGIOGRAPHY CHEST WITH CONTRAST TECHNIQUE: Multidetector CT imaging of the chest was performed using the standard protocol during bolus administration of intravenous contrast. Multiplanar CT image reconstructions and MIPs were obtained to evaluate the vascular anatomy. CONTRAST:  75mL OMNIPAQUE IOHEXOL 350 MG/ML SOLN COMPARISON:  Chest x-ray from earlier in the same day. FINDINGS: Cardiovascular: Thoracic aorta is within normal limits without evidence of aneurysmal dilatation or dissection. No significant atherosclerotic calcifications are seen. The pulmonary artery is well visualized within normal branching pattern. No intraluminal filling defect to suggest pulmonary embolus is identified.  No cardiac enlargement is seen. No coronary calcifications are noted. No pericardial effusion is seen. Mediastinum/Nodes: Thoracic inlet is within normal limits. No hilar or mediastinal adenopathy is noted. The esophagus is within normal limits as visualized. Lungs/Pleura: Lungs are well aerated bilaterally. No focal infiltrate or sizable effusion is seen. No parenchymal nodules are noted. Upper Abdomen: Visualized upper abdomen is within normal limits. Musculoskeletal: No chest wall abnormality. No acute or significant osseous findings. Review of the MIP images confirms the above findings. IMPRESSION: No evidence of pulmonary emboli. No acute abnormality seen. Electronically Signed   By: Alcide Clever M.D.   On: 04/05/2020 09:44   CARDIAC CATHETERIZATION  Result Date: 04/05/2020  There is mild left ventricular systolic dysfunction.  LV end diastolic pressure is normal.  Prox LAD to Mid LAD lesion is 30% stenosed.  Mid LAD lesion  is 95% stenosed.  Post intervention, there is a 0% residual stenosis.  A drug-eluting stent was successfully placed using a STENT RESOLUTE ONYX 2.25X18.  1.  Severe one-vessel coronary artery disease with 95% stenosis in the mid LAD just before the origin of the diagonal with evidence of thrombotic plaque rupture.  No other obstructive disease. 2.  Mildly reduced LV systolic function with distal and apical wall hypokinesis.  Normal LVEDP. 3.  Successful angioplasty and drug-eluting stent placement to the mid LAD. Recommendations: Dual antiplatelet therapy for at least 1 year. Aggressive treatment of risk factors.  I added high-dose statin. We will add small dose carvedilol as well and if blood pressure tolerates, consider an ARB.   ECHOCARDIOGRAM COMPLETE  Result Date: 04/06/2020    ECHOCARDIOGRAM REPORT   Patient Name:   Alicia Phillips Date of Exam: 04/06/2020 Medical Rec #:  [id number]        Height:       67.5 in Accession #:    [id number]       Weight:       164.5 lb Date of Birth:  Aug 04, 1980        BSA:          1.871 m Patient Age:    39 years         BP:           121/70 mmHg Patient Gender: F                HR:           99 bpm. Exam Location:  Inpatient Procedure: 2D Echo, Cardiac Doppler and Color Doppler Indications:    STEMI  History:        Patient has no prior history of Echocardiogram examinations.                 Acute MI and CAD, Arrythmias:palpitations, Signs/Symptoms:Chest                 Pain; Risk Factors:Diabetes and Current Smoker.  Sonographer:    Lavenia Atlas Referring Phys: [id number] CALLIE E GOODRICH IMPRESSIONS  1. Left ventricular ejection fraction, by estimation, is 60 to 65%. The left ventricle has normal function. The left ventricle has no regional wall motion abnormalities. Left ventricular diastolic parameters were normal.  2. Right ventricular systolic function is normal. The right ventricular size is normal. Tricuspid regurgitation signal is inadequate for assessing PA  pressure.  3. The mitral valve is normal in structure. No evidence of mitral valve regurgitation. No evidence of mitral stenosis.  4. The aortic valve is tricuspid. Aortic valve regurgitation is not  visualized. No aortic stenosis is present.  5. The inferior vena cava is normal in size with greater than 50% respiratory variability, suggesting right atrial pressure of 3 mmHg. Comparison(s): No prior Echocardiogram. Conclusion(s)/Recommendation(s): Normal biventricular function without evidence of hemodynamically significant valvular heart disease. FINDINGS  Left Ventricle: Left ventricular ejection fraction, by estimation, is 60 to 65%. The left ventricle has normal function. The left ventricle has no regional wall motion abnormalities. The left ventricular internal cavity size was normal in size. There is  borderline left ventricular hypertrophy. Left ventricular diastolic parameters were normal. Right Ventricle: The right ventricular size is normal. No increase in right ventricular wall thickness. Right ventricular systolic function is normal. Tricuspid regurgitation signal is inadequate for assessing PA pressure. Left Atrium: Left atrial size was normal in size. Right Atrium: Right atrial size was normal in size. Pericardium: There is no evidence of pericardial effusion. Mitral Valve: The mitral valve is normal in structure. No evidence of mitral valve regurgitation. No evidence of mitral valve stenosis. Tricuspid Valve: The tricuspid valve is normal in structure. Tricuspid valve regurgitation is trivial. No evidence of tricuspid stenosis. Aortic Valve: The aortic valve is tricuspid. Aortic valve regurgitation is not visualized. No aortic stenosis is present. Pulmonic Valve: The pulmonic valve was not well visualized. Pulmonic valve regurgitation is not visualized. No evidence of pulmonic stenosis. Aorta: The aortic root, ascending aorta and aortic arch are all structurally normal, with no evidence of dilitation  or obstruction. Venous: The inferior vena cava is normal in size with greater than 50% respiratory variability, suggesting right atrial pressure of 3 mmHg. IAS/Shunts: No atrial level shunt detected by color flow Doppler.  LEFT VENTRICLE PLAX 2D LVIDd:         3.70 cm  Diastology LVIDs:         2.50 cm  LV e' medial:    8.81 cm/s LV PW:         1.10 cm  LV E/e' medial:  9.3 LV IVS:        1.10 cm  LV e' lateral:   8.81 cm/s LVOT diam:     1.80 cm  LV E/e' lateral: 9.3 LV SV:         54 LV SV Index:   29 LVOT Area:     2.54 cm  RIGHT VENTRICLE RV Basal diam:  2.40 cm RV S prime:     9.36 cm/s TAPSE (M-mode): 2.5 cm LEFT ATRIUM             Index       RIGHT ATRIUM           Index LA diam:        2.50 cm 1.34 cm/m  RA Area:     10.90 cm LA Vol (A2C):   41.5 ml 22.18 ml/m RA Volume:   23.20 ml  12.40 ml/m LA Vol (A4C):   24.8 ml 13.25 ml/m LA Biplane Vol: 32.6 ml 17.42 ml/m  AORTIC VALVE LVOT Vmax:   109.00 cm/s LVOT Vmean:  73.700 cm/s LVOT VTI:    0.212 m  AORTA Ao Root diam: 2.50 cm MITRAL VALVE MV Area (PHT): 8.92 cm     SHUNTS MV Decel Time: 85 msec      Systemic VTI:  0.21 m MV E velocity: 82.30 cm/s   Systemic Diam: 1.80 cm MV A velocity: 104.00 cm/s MV E/A ratio:  0.79 Jodelle RedBridgette Christopher MD Electronically signed by Jodelle RedBridgette Christopher MD Signature Date/Time: 04/06/2020/12:53:26 PM  Final    Disposition   Patient is being discharged home today in good condition.  Follow-up Plans & Appointments     Follow-up Information     Schedule an appointment as soon as possible for a visit  with Aliene Beams, MD.   Specialty: Family Medicine Contact information: [address] [place] [address] [phone]         Marcelino Duster, PA Follow up.   Specialties: Physician Assistant, Cardiology, Radiology Why: Hospital follow-up scheduled for 04/13/2020 at 10:45am. Please arrive 15 minutes early for check-in. Contact information: [address] [place]  [address] [phone]                Discharge Instructions     Amb Referral to Cardiac Rehabilitation   Complete by: As directed    Diagnosis:  Coronary Stents NSTEMI     After initial evaluation and assessments completed: Virtual Based Care may be provided alone or in conjunction with Phase 2 Cardiac Rehab based on patient barriers.: Yes   Diet - low sodium heart healthy   Complete by: As directed    Increase activity slowly   Complete by: As directed        Discharge Medications   Allergies as of 04/06/2020       Reactions   Sulfamethoxazole-trimethoprim Rash        Medication List     TAKE these medications    Acetaminophen Extra Strength 500 MG tablet Generic drug: acetaminophen Take 500 mg by mouth every 4 (four) hours as needed for mild pain or headache.   aspirin 81 MG chewable tablet Chew 1 tablet (81 mg total) by mouth daily.   atorvastatin 80 MG tablet Commonly known as: LIPITOR Take 1 tablet (80 mg total) by mouth daily.   carvedilol 6.25 MG tablet Commonly known as: COREG Take 1 tablet (6.25 mg total) by mouth 2 (two) times daily with a meal.   cyclobenzaprine 10 MG tablet Commonly known as: FLEXERIL Take 10 mg by mouth at bedtime as needed for sleep.   doxycycline 50 MG capsule Commonly known as: VIBRAMYCIN Take 50 mg by mouth daily.   insulin aspart 100 UNIT/ML injection Commonly known as: novoLOG Inject 0-20 Units into the skin 3 (three) times daily before meals. Per sliding scale   lamoTRIgine 100 MG tablet Commonly known as: LaMICtal Take one tablet at bedtime. What changed:  how much to take how to take this when to take this   nitroGLYCERIN 0.4 MG SL tablet Commonly known as: Nitrostat Place 1 tablet (0.4 mg total) under the tongue every 5 (five) minutes as needed for chest pain.   ticagrelor 90 MG Tabs tablet Commonly known as: BRILINTA Take 1 tablet (90 mg total) by mouth 2 (two) times daily.   Evaristo Bury FlexTouch  100 UNIT/ML FlexTouch Pen Generic drug: insulin degludec Inject 25 Units into the skin daily.           Outstanding Labs/Studies   Repeat lipid panel and LFTs in 6-8 weeks.  Duration of Discharge Encounter   Greater than 30 minutes including physician time.  Signed, Corrin Parker, PA-C 04/06/2020, 1:00 PM  Agree with note by Marjie Skiff, PA-C  Pt ready for DC s/p mid LAD PCI/stent in setting of Anterior STEMI. Echo showed preserved LV FXN. EKG showed typical evolution of STTW changes. Pt of all approp meds . Asymptomatic. Exam benign. F/U arranged.   Runell Gess, M.D., FACP, [facility], [place], [place]  [facility] [address] [place], [place]  [address]  [phone] 04/06/2020 3:33 PM

## 2020-04-07 ENCOUNTER — Telehealth (HOSPITAL_COMMUNITY): Payer: Self-pay

## 2020-04-07 NOTE — Telephone Encounter (Signed)
Pt lives in [place] but she works at [employer]. Faxed pt cardiac rehab referral to [facility] cardiac rehab.

## 2020-04-07 NOTE — Progress Notes (Signed)
Cardiology Office Note:    Date:  04/13/2020   ID:  Alicia Phillips, DOB 01-23-1981, MRN [id number]  PCP:  Aliene Beams, MD  Cardiologist:  Nanetta Batty, MD   Referring MD: Aliene Beams, MD   Chief Complaint  Patient presents with  . Hospitalization Follow-up    NSTEMI    History of Present Illness:    Alicia Phillips is a 39 y.o. female with a hx of type 1 DM, hidradenitis suppurativa, HLD, tobacco use, depression and anxiety was recently admitted with NSTEMI.  She presented to [facility] with back muscle spasms, left arm pain, and palpitations. One troponin drawn and was 30. She left prior to being seen due to a long wait. She was called to return to the ER. Upon her return, HST was 646 with ST elevation in V2 that did not meet criteria for STEMI. Decision made for angiography. Heart cath revealed 95% stenosis in the mid LAD successfully treated with DES. She was placed on ASA and brilinta, statin, and BB. Following procedure, EKG with ST elevation in anterior leads and ST depression in inferior leads. This was not in the setting of chest pain. EKG on the day of discharge with TWI in anterior leads  In discussion with Dr. Allyson Sabal, will treat as a presenting NSTEMI with EKG changes consistent with STEMI following procedure without accompanying symptoms.  She returns today for follow up. She presented to [facility] 04/11/20 with chest pain and SOB. HS troponin 244 --> 235 and no ischemic changes on EKG. She was discharged without admission or further ischemic evaluation. She has many questions and has had problems with increased anxiety. I encouraged cardiac rehab. She has some breathlessness with brilinta, but will continue for now. She feels tired and weak. In review of labs, Hb was 11.7 in the ER, down from 14 a week prior.    Past Medical History:  Diagnosis Date  . Anxiety   . CAD (coronary artery disease)    a. LHC 04/05/20: 95% stenosis of mid LAD s/p DES, 30% stenosis of proximal to mid  LAD  . Diabetes mellitus type 1 (HCC)   . Hyperlipidemia   . Major depression   . STEMI (ST elevation myocardial infarction) (HCC) 04/05/2020  . Tobacco use     Past Surgical History:  Procedure Laterality Date  . CORONARY STENT INTERVENTION N/A 04/05/2020   Procedure: CORONARY STENT INTERVENTION;  Surgeon: Iran Ouch, MD;  Location: [facility] INVAS[facility] CV LAB;  Service: Cardiovascular;  Laterality: N/A;  . LEFT HEART CATH AND CORONARY ANGIOGRAPHY N/A 04/05/2020   Procedure: LEFT HEART CATH AND CORONARY ANGIOGRAPHY;  Surgeon: Iran Ouch, MD;  Location: [facility] INVASIVE CV LAB;  Service: Cardiovascular;  Laterality: N/A;    Current Medications: Current Meds  Medication Sig  . ACETAMINOPHEN EXTRA STRENGTH 500 MG tablet Take 500 mg by mouth every 4 (four) hours as needed for mild pain or headache.   Marland Kitchen aspirin 81 MG chewable tablet Chew 1 tablet (81 mg total) by mouth daily.  Marland Kitchen atorvastatin (LIPITOR) 80 MG tablet Take 1 tablet (80 mg total) by mouth daily.  Marland Kitchen doxycycline (VIBRAMYCIN) 50 MG capsule Take 50 mg by mouth daily.  . insulin aspart (NOVOLOG) 100 UNIT/ML injection Inject 0-20 Units into the skin 3 (three) times daily before meals. Per sliding scale  . lamoTRIgine (LAMICTAL) 100 MG tablet Take one tablet at bedtime. (Patient taking differently: Take 100 mg by mouth at bedtime. Take one tablet at bedtime.)  .  nitroGLYCERIN (NITROSTAT) 0.4 MG SL tablet Place 1 tablet (0.4 mg total) under the tongue every 5 (five) minutes as needed for chest pain.  . ticagrelor (BRILINTA) 90 MG TABS tablet Take 1 tablet (90 mg total) by mouth 2 (two) times daily.  Evaristo Bury FLEXTOUCH 100 UNIT/ML FlexTouch Pen Inject 25 Units into the skin daily.   . [DISCONTINUED] carvedilol (COREG) 6.25 MG tablet Take 1 tablet (6.25 mg total) by mouth 2 (two) times daily with a meal.   Current Facility-Administered Medications for the 04/13/20 encounter (Office Visit) with Marcelino Duster, PA  Medication  . iron  polysaccharides (NIFEREX) capsule 150 mg     Allergies:   Sulfamethoxazole-trimethoprim   Social History   Socioeconomic History  . Marital status: Single    Spouse name: Not on file  . Number of children: Not on file  . Years of education: Not on file  . Highest education level: Not on file  Occupational History  . Not on file  Tobacco Use  . Smoking status: Current Every Day Smoker  . Smokeless tobacco: Never Used  Substance and Sexual Activity  . Alcohol use: Never  . Drug use: Never  . Sexual activity: Not on file  Other Topics Concern  . Not on file  Social History Narrative  . Not on file   Social Determinants of Health   Financial Resource Strain:   . Difficulty of Paying Living Expenses: Not on file  Food Insecurity:   . Worried About Programme researcher, broadcasting/film/video in the Last Year: Not on file  . Ran Out of Food in the Last Year: Not on file  Transportation Needs:   . Lack of Transportation (Medical): Not on file  . Lack of Transportation (Non-Medical): Not on file  Physical Activity:   . Days of Exercise per Week: Not on file  . Minutes of Exercise per Session: Not on file  Stress:   . Feeling of Stress : Not on file  Social Connections:   . Frequency of Communication with Friends and Family: Not on file  . Frequency of Social Gatherings with Friends and Family: Not on file  . Attends Religious Services: Not on file  . Active Member of Clubs or Organizations: Not on file  . Attends Banker Meetings: Not on file  . Marital Status: Not on file     Family History: The patient's family history includes Breast cancer in her maternal grandmother.  ROS:   Please see the history of present illness.     All other systems reviewed and are negative.  EKGs/Labs/Other Studies Reviewed:    The following studies were reviewed today:  Left heart cath 04/05/20:  There is mild left ventricular systolic dysfunction.  LV end diastolic pressure is  normal.  Prox LAD to Mid LAD lesion is 30% stenosed.  Mid LAD lesion is 95% stenosed.  Post intervention, there is a 0% residual stenosis.  A drug-eluting stent was successfully placed using a STENT RESOLUTE ONYX 2.25X18.   1.  Severe one-vessel coronary artery disease with 95% stenosis in the mid LAD just before the origin of the diagonal with evidence of thrombotic plaque rupture.  No other obstructive disease. 2.  Mildly reduced LV systolic function with distal and apical wall hypokinesis.  Normal LVEDP. 3.  Successful angioplasty and drug-eluting stent placement to the mid LAD.  Recommendations: Dual antiplatelet therapy for at least 1 year. Aggressive treatment of risk factors.  I added high-dose statin. We  will add small dose carvedilol as well and if blood pressure tolerates, consider an ARB.   Echo 04/06/20: 1. Left ventricular ejection fraction, by estimation, is 60 to 65%. The  left ventricle has normal function. The left ventricle has no regional  wall motion abnormalities. Left ventricular diastolic parameters were  normal.  2. Right ventricular systolic function is normal. The right ventricular  size is normal. Tricuspid regurgitation signal is inadequate for assessing  PA pressure.  3. The mitral valve is normal in structure. No evidence of mitral valve  regurgitation. No evidence of mitral stenosis.  4. The aortic valve is tricuspid. Aortic valve regurgitation is not  visualized. No aortic stenosis is present.  5. The inferior vena cava is normal in size with greater than 50%  respiratory variability, suggesting right atrial pressure of 3 mmHg.   EKG:  EKG is  ordered today.  The ekg ordered today demonstrates sinus rhythm HR 84  Recent Labs: 04/05/2020: ALT 12 04/11/2020: BUN 7; Creatinine, Ser 0.73; Hemoglobin 11.7; Platelets 339; Potassium 4.0; Sodium 134  Recent Lipid Panel    Component Value Date/Time   CHOL 193 04/06/2020 0832   TRIG 96 04/06/2020 0832    HDL 50 04/06/2020 0832   CHOLHDL 3.9 04/06/2020 0832   VLDL 19 04/06/2020 0832   LDLCALC 124 (H) 04/06/2020 0832    Physical Exam:    VS:  BP 100/62   Pulse 84   Ht 5\' 7"  (1.702 m)   Wt 163 lb 3.2 oz (74 kg)   LMP 04/04/2020 (Exact Date)   SpO2 98%   BMI 25.56 kg/m     Wt Readings from Last 3 Encounters:  04/13/20 163 lb 3.2 oz (74 kg)  04/06/20 164 lb 8 oz (74.6 kg)  12/25/17 190 lb 6.4 oz (86.4 kg)     GEN:  Well nourished, well developed in no acute distress HEENT: Normal NECK: No JVD; No carotid bruits LYMPHATICS: No lymphadenopathy CARDIAC: RRR, no murmurs, rubs, gallops RESPIRATORY:  Clear to auscultation without rales, wheezing or rhonchi  ABDOMEN: Soft, non-tender, non-distended MUSCULOSKELETAL:  No edema; No deformity  SKIN: Warm and dry NEUROLOGIC:  Alert and oriented x 3 PSYCHIATRIC:  Normal affect   ASSESSMENT:    1. Coronary artery disease of native artery of native heart with stable angina pectoris (HCC)   2. Non-ST elevation (NSTEMI) myocardial infarction (HCC)   3. Anemia, unspecified type   4. Type 1 diabetes mellitus without complication (HCC)   5. Mixed hyperlipidemia   6. Tobacco abuse   7. Anxiety    PLAN:    In order of problems listed above:  CAD NSTEMI - continue DAPT with ASA and brilinta - continue BB and statin - chest discomfort likely the result of hypervigilance, but also vasospasm during procedure - no room to titrate BB or add amlodipine - due to complaint of weakness, will transition coreg to toprol at night   Anemia - Hb 11.7 from 14.1 - 8 days ago - she sees PCP tomorrow and will have this rechecked (did not check today since its only been 2 days) - have also suggested taking Nu-iron for now with a stool softener - may be contributing to her weakness - per patient report, no signs of active bleeding   Hyperlipidemia with LDL goal < 70 04/06/2020: Cholesterol 193; HDL 50; LDL Cholesterol 124; Triglycerides 96; VLDL  19 Placed on 80 mg lipitor. Repeat lipids in 4 weeks.   Type 1 DM  -  insulin per primary - appt with endocrinology   Current smoker - has stopped smoking   Anxiety - long discussion regarding counseling, exercise, and diet - she will start doing gentle yoga - she will seek out counseling and is amenable to cardiac rehab   Follow up in 4 weeks with Dr. Allyson SabalBerry.    Medication Adjustments/Labs and Tests Ordered: Current medicines are reviewed at length with the patient today.  Concerns regarding medicines are outlined above.  Orders Placed This Encounter  Procedures  . EKG 12-Lead   Meds ordered this encounter  Medications  . iron polysaccharides (NIFEREX) capsule 150 mg  . metoprolol succinate (TOPROL-XL) 25 MG 24 hr tablet    Sig: Take 1 tablet (25 mg total) by mouth daily. Take with or immediately following a meal.    Dispense:  90 tablet    Refill:  3    Signed, Marcelino Dusterngela Nicole Callen Zuba, [place]  04/13/2020 1:40 PM    [facility]

## 2020-04-11 ENCOUNTER — Other Ambulatory Visit: Payer: Self-pay

## 2020-04-11 ENCOUNTER — Encounter (HOSPITAL_COMMUNITY): Payer: Self-pay | Admitting: Emergency Medicine

## 2020-04-11 ENCOUNTER — Emergency Department (HOSPITAL_COMMUNITY)
Admission: EM | Admit: 2020-04-11 | Discharge: 2020-04-11 | Disposition: A | Discharge status: Home / Self Care | Payer: BC Managed Care – PPO | Attending: Emergency Medicine | Admitting: Emergency Medicine

## 2020-04-11 ENCOUNTER — Emergency Department (HOSPITAL_COMMUNITY): Payer: BC Managed Care – PPO

## 2020-04-11 DIAGNOSIS — R079 Chest pain, unspecified: Secondary | ICD-10-CM | POA: Diagnosis not present

## 2020-04-11 DIAGNOSIS — E109 Type 1 diabetes mellitus without complications: Secondary | ICD-10-CM | POA: Diagnosis not present

## 2020-04-11 DIAGNOSIS — I2510 Atherosclerotic heart disease of native coronary artery without angina pectoris: Secondary | ICD-10-CM | POA: Insufficient documentation

## 2020-04-11 DIAGNOSIS — I10 Essential (primary) hypertension: Secondary | ICD-10-CM | POA: Diagnosis not present

## 2020-04-11 DIAGNOSIS — F17200 Nicotine dependence, unspecified, uncomplicated: Secondary | ICD-10-CM | POA: Diagnosis not present

## 2020-04-11 DIAGNOSIS — R0789 Other chest pain: Secondary | ICD-10-CM | POA: Diagnosis not present

## 2020-04-11 DIAGNOSIS — Z794 Long term (current) use of insulin: Secondary | ICD-10-CM | POA: Diagnosis not present

## 2020-04-11 DIAGNOSIS — Z7982 Long term (current) use of aspirin: Secondary | ICD-10-CM | POA: Diagnosis not present

## 2020-04-11 DIAGNOSIS — R000 Tachycardia, unspecified: Secondary | ICD-10-CM | POA: Diagnosis not present

## 2020-04-11 LAB — CBC
HCT: 35.8 % — ABNORMAL LOW (ref 36.0–46.0)
Hemoglobin: 11.7 g/dL — ABNORMAL LOW (ref 12.0–15.0)
MCH: 30 pg (ref 26.0–34.0)
MCHC: 32.7 g/dL (ref 30.0–36.0)
MCV: 91.8 fL (ref 80.0–100.0)
Platelets: 339 10*3/uL (ref 150–400)
RBC: 3.9 MIL/uL (ref 3.87–5.11)
RDW: 12.3 % (ref 11.5–15.5)
WBC: 9.8 10*3/uL (ref 4.0–10.5)
nRBC: 0 % (ref 0.0–0.2)

## 2020-04-11 LAB — BASIC METABOLIC PANEL
Anion gap: 11 (ref 5–15)
BUN: 7 mg/dL (ref 6–20)
CO2: 21 mmol/L — ABNORMAL LOW (ref 22–32)
Calcium: 9.2 mg/dL (ref 8.9–10.3)
Chloride: 102 mmol/L (ref 98–111)
Creatinine, Ser: 0.73 mg/dL (ref 0.44–1.00)
GFR calc Af Amer: >60 mL/min (ref >60)
GFR calc non Af Amer: >60 mL/min (ref >60)
Glucose, Bld: 278 mg/dL — ABNORMAL HIGH (ref 70–99)
Potassium: 4 mmol/L (ref 3.5–5.1)
Sodium: 134 mmol/L — ABNORMAL LOW (ref 135–145)

## 2020-04-11 LAB — TROPONIN I (HIGH SENSITIVITY)
Troponin I (High Sensitivity): 244 ng/L (ref <18)
Troponin I (High Sensitivity): 235 ng/L (ref <18)

## 2020-04-11 LAB — I-STAT BETA HCG BLOOD, ED (MC, WL, AP ONLY): I-stat hCG, quantitative: <5 m[IU]/mL (ref <5)

## 2020-04-11 MED ORDER — ASPIRIN 81 MG PO CHEW
324.0000 mg | CHEWABLE_TABLET | Freq: Once | ORAL | Status: AC
  Administered 2020-04-11: 324 mg via ORAL
  Filled 2020-04-11: qty 4

## 2020-04-11 NOTE — ED Provider Notes (Signed)
[facility] EMERGENCY DEPARTMENT Provider Note   CSN: [id number] Arrival date & time: 04/11/20  0142     History Chief Complaint  Patient presents with  . Chest Pain    Alicia Phillips is a 39 y.o. female.  The history is provided by the patient and medical records.  Chest Pain   39 y.o. F with hx of anxiety, CAD and NSTEMI with LHC with DES placed to LAD on 04/05/20, HLP, depression, tobacco use, presenting to the ED with chest pain.  She was discharged home 04/06/20 and was feeling fine.  She has been compliant with all medications.  States around midnight she developed some chest tightness in left upper chest to the shoulder.  States it was mild, maybe a 1-2/10 and different from prior.  No radiation into neck/shoulder.  Denies any SOB, palpitations, dizziness, diaphoresis, weakness, or feelings of syncope.  States she lifted something earlier today around 4PM so thought maybe she just strained her arm but given recent history she was concerned.  Supposed to have follow-up with cardiology on Thursday (04/13/20).  Past Medical History:  Diagnosis Date  . Anxiety   . CAD (coronary artery disease)    a. LHC 04/05/20: 95% stenosis of mid LAD s/p DES, 30% stenosis of proximal to mid LAD  . Diabetes mellitus type 1 (HCC)   . Hyperlipidemia   . Major depression   . STEMI (ST elevation myocardial infarction) (HCC) 04/05/2020  . Tobacco use     Patient Active Problem List   Diagnosis Date Noted  . Tobacco abuse 04/06/2020  . Hyperlipidemia 04/06/2020  . CAD (coronary artery disease) 04/06/2020  . STEMI (ST elevation myocardial infarction) (HCC) 04/05/2020  . Depressive disorder 12/06/2016  . Type 1 diabetes mellitus (HCC) 11/04/2016    Past Surgical History:  Procedure Laterality Date  . CORONARY STENT INTERVENTION N/A 04/05/2020   Procedure: CORONARY STENT INTERVENTION;  Surgeon: Iran Ouch, MD;  Location: [facility] INVASIVE CV LAB;  Service: Cardiovascular;   Laterality: N/A;  . LEFT HEART CATH AND CORONARY ANGIOGRAPHY N/A 04/05/2020   Procedure: LEFT HEART CATH AND CORONARY ANGIOGRAPHY;  Surgeon: Iran Ouch, MD;  Location: [facility] INVASIVE CV LAB;  Service: Cardiovascular;  Laterality: N/A;     OB History   No obstetric history on file.     Family History  Problem Relation Age of Onset  . Breast cancer Maternal Grandmother     Social History   Tobacco Use  . Smoking status: Current Every Day Smoker  . Smokeless tobacco: Never Used  Substance Use Topics  . Alcohol use: Never  . Drug use: Never    Home Medications Prior to Admission medications   Medication Sig Start Date End Date Taking? Authorizing Provider  ACETAMINOPHEN EXTRA STRENGTH 500 MG tablet Take 500 mg by mouth every 4 (four) hours as needed for mild pain or headache.  05/06/19   [provider]  aspirin 81 MG chewable tablet Chew 1 tablet (81 mg total) by mouth daily. 04/06/20   Marjie Skiff E, PA-C  atorvastatin (LIPITOR) 80 MG tablet Take 1 tablet (80 mg total) by mouth daily. 04/06/20   Marjie Skiff E, PA-C  carvedilol (COREG) 6.25 MG tablet Take 1 tablet (6.25 mg total) by mouth 2 (two) times daily with a meal. 04/06/20   Marjie Skiff E, PA-C  cyclobenzaprine (FLEXERIL) 10 MG tablet Take 10 mg by mouth at bedtime as needed for sleep. 03/29/20   [provider]  doxycycline (VIBRAMYCIN) 50 MG capsule Take 50 mg by mouth daily. 03/06/20   [provider]  insulin aspart (NOVOLOG) 100 UNIT/ML injection Inject 0-20 Units into the skin 3 (three) times daily before meals. Per sliding scale    [provider]  lamoTRIgine (LAMICTAL) 100 MG tablet Take one tablet at bedtime. Patient taking differently: Take 100 mg by mouth daily. Take one tablet at bedtime. 02/01/20   Mozingo, Thereasa Solo, NP  nitroGLYCERIN (NITROSTAT) 0.4 MG SL tablet Place 1 tablet (0.4 mg total) under the tongue every 5 (five) minutes as needed for chest pain. 04/06/20  04/06/21  Corrin Parker, PA-C  ticagrelor (BRILINTA) 90 MG TABS tablet Take 1 tablet (90 mg total) by mouth 2 (two) times daily. 04/06/20   Marjie Skiff E, PA-C  TRESIBA FLEXTOUCH 100 UNIT/ML FlexTouch Pen Inject 25 Units into the skin daily.  04/03/20   [provider]    Allergies    Sulfamethoxazole-trimethoprim  Review of Systems   Review of Systems  Cardiovascular: Positive for chest pain.  All other systems reviewed and are negative.   Physical Exam Updated Vital Signs BP 112/80 (BP Location: Left Arm)   Pulse 91   Temp 98.4 F (36.9 C) (Oral)   Resp 18   LMP 04/04/2020 (Exact Date)   SpO2 98%   Physical Exam Vitals and nursing note reviewed.  Constitutional:      Appearance: She is well-developed.  HENT:     Head: Normocephalic and atraumatic.  Eyes:     Conjunctiva/sclera: Conjunctivae normal.     Pupils: Pupils are equal, round, and reactive to light.  Cardiovascular:     Rate and Rhythm: Normal rate and regular rhythm.     Heart sounds: Normal heart sounds.  Pulmonary:     Effort: Pulmonary effort is normal.     Breath sounds: Normal breath sounds. No decreased breath sounds, wheezing or rhonchi.  Chest:     Comments: Chest wall non-tender Abdominal:     General: Bowel sounds are normal.     Palpations: Abdomen is soft.  Musculoskeletal:        General: Normal range of motion.     Cervical back: Normal range of motion.  Skin:    General: Skin is warm and dry.  Neurological:     Mental Status: She is alert and oriented to person, place, and time.  Psychiatric:     Comments: Slightly anxious     ED Results / Procedures / Treatments   Labs (all labs ordered are listed, but only abnormal results are displayed) Labs Reviewed  BASIC METABOLIC PANEL - Abnormal; Notable for the following components:      Result Value   Sodium 134 (*)    CO2 21 (*)    Glucose, Bld 278 (*)    All other components within normal limits  CBC - Abnormal;  Notable for the following components:   Hemoglobin 11.7 (*)    HCT 35.8 (*)    All other components within normal limits  TROPONIN I (HIGH SENSITIVITY) - Abnormal; Notable for the following components:   Troponin I (High Sensitivity) 244 (*)    All other components within normal limits  TROPONIN I (HIGH SENSITIVITY) - Abnormal; Notable for the following components:   Troponin I (High Sensitivity) 235 (*)    All other components within normal limits  I-STAT BETA HCG BLOOD, ED ([facility], [facility], [facility] ONLY)     EKG None  Radiology DG Chest 2 View  Result Date: 04/11/2020 CLINICAL DATA:  Left-sided chest pain and tightness. EXAM: CHEST - 2 VIEW COMPARISON:  April 05, 2020 FINDINGS: The heart size and mediastinal contours are within normal limits. Both lungs are clear. The visualized skeletal structures are unremarkable. IMPRESSION: No active cardiopulmonary disease. Electronically Signed   By: Aram Candela M.D.   On: 04/11/2020 02:18    Procedures Procedures (including critical care time)  CRITICAL CARE Performed by: Garlon Hatchet   Total critical care time: 35 minutes  Critical care time was exclusive of separately billable procedures and treating other patients.  Critical care was necessary to treat or prevent imminent or life-threatening deterioration.  Critical care was time spent personally by me on the following activities: development of treatment plan with patient and/or surrogate as well as nursing, discussions with consultants, evaluation of patient's response to treatment, examination of patient, obtaining history from patient or surrogate, ordering and performing treatments and interventions, ordering and review of laboratory studies, ordering and review of radiographic studies, pulse oximetry and re-evaluation of patient's condition.   Medications Ordered in ED Medications  aspirin chewable tablet 324 mg (324 mg Oral Given 04/11/20 0426)    ED Course  I have reviewed  the triage vital signs and the nursing notes.  Pertinent labs & imaging results that were available during my care of the patient were reviewed by me and considered in my medical decision making (see chart for details).    MDM Rules/Calculators/A&P    39 year old female with hx of CAD, recent NSTEMI 04/05/20 with DES to LAD, presenting to the ED with chest pain.  Symptoms began shortly after midnight while lying in bed, described as tightness.  No SOB, diaphoresis, nausea, vomiting,dizziness, or weakness.  Admits to picking up something heavy yesterday so thought maybe it was muscle tension, however also felt like something was not right.  EKG here is actually improved from prior.  Initial labs with troponin of 244 (prior was 977) so possibly still down trending. Patient is slightly anxious which is understandable.  She has been compliant with ASA and brillinta.  Will discuss with cardiology.  4:28 AM Spoke with cardiology, Dr. Meredeth Ide-- has reviewed chart, cath report, and labs from today.  Without ischemic changes on EKG and single vessel disease on cath, would not pursue repeat cath.  Will get delta troponin and if flat or downtrending, she is ok for discharge with office follow-up on Thursday as scheduled.  5:13 AM Delta trop continues downtrending at 235.  Symptoms do seem somewhat atypical.  Discussed results with patient and her mother along with cardiology recommendations.  As troponins continued downtrending, patient feels reassured and is comfortable going home to follow-up with cardiology in 2 days as scheduled.  She understands that should symptoms recur, develop new symptoms of shortness of breath, sweating, dizziness, or feelings of syncope, she should return immediately to the ER.  Final Clinical Impression(s) / ED Diagnoses Final diagnoses:  Chest pain in adult    Rx / DC Orders ED Discharge Orders    None       Garlon Hatchet, PA-C 04/11/20 0541    Mesner, Barbara Cower,  MD 04/11/20 [phone]

## 2020-04-11 NOTE — ED Triage Notes (Signed)
Pt transported from home by EMS c/o L sided chest pain/ tightness onset @[facility] , MI last week with stent placement. IV est. ASA 324/ nitro x 2 at home.

## 2020-04-11 NOTE — Discharge Instructions (Signed)
Your cardiac enzymes continue downtrending today.  It will likely take some time for these to go back to normal levels. Follow-up with cardiology on Thursday as scheduled. You already had dose of aspirin this morning so can hold your home dose here, take brillinta as normal. Return to the ER for any new/acute changes-- recurrent chest pain, SOB, sweating, vomiting, dizziness, feeling like you will pass out, etc.

## 2020-04-12 ENCOUNTER — Other Ambulatory Visit: Payer: Self-pay | Admitting: *Deleted

## 2020-04-12 DIAGNOSIS — I214 Non-ST elevation (NSTEMI) myocardial infarction: Secondary | ICD-10-CM

## 2020-04-12 DIAGNOSIS — Z955 Presence of coronary angioplasty implant and graft: Secondary | ICD-10-CM

## 2020-04-13 ENCOUNTER — Other Ambulatory Visit: Payer: Self-pay

## 2020-04-13 ENCOUNTER — Ambulatory Visit: Payer: BC Managed Care – PPO | Admitting: Physician Assistant

## 2020-04-13 ENCOUNTER — Encounter: Payer: Self-pay | Admitting: Physician Assistant

## 2020-04-13 VITALS — BP 100/62 | HR 84 | Ht 67.0 in | Wt 163.2 lb

## 2020-04-13 DIAGNOSIS — E782 Mixed hyperlipidemia: Secondary | ICD-10-CM

## 2020-04-13 DIAGNOSIS — E109 Type 1 diabetes mellitus without complications: Secondary | ICD-10-CM | POA: Diagnosis not present

## 2020-04-13 DIAGNOSIS — I25118 Atherosclerotic heart disease of native coronary artery with other forms of angina pectoris: Secondary | ICD-10-CM

## 2020-04-13 DIAGNOSIS — F419 Anxiety disorder, unspecified: Secondary | ICD-10-CM

## 2020-04-13 DIAGNOSIS — D649 Anemia, unspecified: Secondary | ICD-10-CM | POA: Diagnosis not present

## 2020-04-13 DIAGNOSIS — I214 Non-ST elevation (NSTEMI) myocardial infarction: Secondary | ICD-10-CM | POA: Diagnosis not present

## 2020-04-13 DIAGNOSIS — Z720 Tobacco use: Secondary | ICD-10-CM

## 2020-04-13 MED ORDER — METOPROLOL SUCCINATE ER 25 MG PO TB24: 25.0000 mg | ORAL_TABLET | Freq: Every day | ORAL | 3 refills | Status: DC

## 2020-04-13 MED ORDER — POLYSACCHARIDE IRON COMPLEX 150 MG PO CAPS: 150.0000 mg | ORAL_CAPSULE | Freq: Every day | ORAL | Status: DC

## 2020-04-13 NOTE — Patient Instructions (Signed)
Medication Instructions:  Metoprolol Succinate 25 mg (1 Tablet Daily at night) *If you need a refill on your cardiac medications before your next appointment, please call your pharmacy*   Lab Work: None If you have labs (blood work) drawn today and your tests are completely normal, you will receive your results only by: Marland Kitchen MyChart Message (if you have MyChart) OR . A paper copy in the mail If you have any lab test that is abnormal or we need to change your treatment, we will call you to review the results.   Testing/Procedures: None   Follow-Up: At [facility], you and your health needs are our priority.  As part of our continuing mission to provide you with exceptional heart care, we have created designated Provider Care Teams.  These Care Teams include your primary Cardiologist (physician) and Advanced Practice Providers (APPs -  Physician Assistants and Nurse Practitioners) who all work together to provide you with the care you need, when you need it.     Your next appointment:   4 week(s)  The format for your next appointment:   In Person  Provider:   Nanetta Batty, MD   Other Instructions None

## 2020-04-14 ENCOUNTER — Other Ambulatory Visit: Payer: Self-pay

## 2020-04-14 ENCOUNTER — Telehealth: Payer: Self-pay | Admitting: Cardiovascular Disease

## 2020-04-14 ENCOUNTER — Ambulatory Visit (HOSPITAL_COMMUNITY)
Admission: EM | Admit: 2020-04-14 | Discharge: 2020-04-14 | Disposition: A | Discharge status: Other Acute Inpatient Hospital | Payer: BC Managed Care – PPO

## 2020-04-14 DIAGNOSIS — I2510 Atherosclerotic heart disease of native coronary artery without angina pectoris: Secondary | ICD-10-CM | POA: Diagnosis not present

## 2020-04-14 DIAGNOSIS — I213 ST elevation (STEMI) myocardial infarction of unspecified site: Secondary | ICD-10-CM | POA: Diagnosis not present

## 2020-04-14 DIAGNOSIS — F419 Anxiety disorder, unspecified: Secondary | ICD-10-CM | POA: Diagnosis not present

## 2020-04-14 DIAGNOSIS — E108 Type 1 diabetes mellitus with unspecified complications: Secondary | ICD-10-CM | POA: Diagnosis not present

## 2020-04-14 NOTE — Telephone Encounter (Signed)
Alicia Phillips is returning [name] call.

## 2020-04-14 NOTE — Care Management (Signed)
Patient wants to receive PHP services.  Writer emailed referral information to the PHP intake team.   Patient denies SI/HI/Psychosis/Substance Abuse.  Patient denies MSE Exam.

## 2020-04-14 NOTE — Telephone Encounter (Signed)
FU  Pt called in and She is  Wondering if Dr Jerald Kief Duke PA  will  order Troponin,  Best number [phone]

## 2020-04-14 NOTE — Telephone Encounter (Signed)
Returned call to patient-spoke to mother (ok per DPR)-mother states she is currently at [facility] behavioral health to try to establish with a therapist.   She states PCP did not do EKG or Troponin, they wanted this to be left up to cardiology.    Advised if having symptoms to proceed to ER for evaluation.   Mother is aware and verbalized understanding.

## 2020-04-14 NOTE — Telephone Encounter (Signed)
New message:     Patient calling stating that she is having some back pain and she states that she had a hardtack last week. Please call patient she some questions. Please call patient.

## 2020-04-14 NOTE — Telephone Encounter (Signed)
[email or web address]

## 2020-04-14 NOTE — Telephone Encounter (Signed)
Returned call to patient-patient states last night, throughout the night and this AM she is having some back pain.   States the pain is "down her spine".  She states she cannot tell if she is "paranoid" or if this is heart related.  She states she has not been sleeping well and this may contribute as well.  She denies SOB, CP, radiation, palpitations, no other symptoms.   Pain does not get worse or better with palpation, does not worsen with exertion or rest.   She states her back is "cracking".   She has not taken her BP or HR, does not have a monitor to do so at home yet.   States this is not like her pain before, the pain with her MI was episodic and intense pain.   She has not taken anything for pain.   She states she has a PCP visit at 12 today and was wondering if they have the capability to draw a Troponin.  She knows it was trending down in the ER but still positive.  She has not missed any Brilinta or ASA doses.   Patient became tearful on the phone multiple times, concerned and not sure how to decipher cardiac pain vs other pain.  She does not want this to happen again.  Per chart review: STEMI post PCI 9/8 ER 9/14 with chest pain OV 9/16 with Micah Flesher PA.  EKG completed.    She is comfortable going to PCP at 12 but is wondering if they would have capability to draw Troponin there.  Advised they should be able to order but it would be up to their descrepancy if they order or not.  Also aware they would be able to do EKG as well.   She would like to send EKG over to our office for our providers to have as well.   Advised PCP can call and speak to DOD if needed as well.   She is also wondering if they will not order Troponin, if Micah Flesher PA (who she saw yesterday) would order this to be done.   Advised would have to discuss with her.     Also advised if symptoms change or worsen to proceed to ER to be evaluated.   Patient is aware and verbalized understanding.

## 2020-04-17 ENCOUNTER — Telehealth (HOSPITAL_COMMUNITY): Payer: Self-pay | Admitting: Licensed Clinical Social Worker

## 2020-04-18 ENCOUNTER — Ambulatory Visit (INDEPENDENT_AMBULATORY_CARE_PROVIDER_SITE_OTHER): Payer: BC Managed Care – PPO | Admitting: Licensed Clinical Social Worker

## 2020-04-18 ENCOUNTER — Other Ambulatory Visit: Payer: Self-pay

## 2020-04-18 DIAGNOSIS — L732 Hidradenitis suppurativa: Secondary | ICD-10-CM | POA: Diagnosis not present

## 2020-04-18 DIAGNOSIS — E109 Type 1 diabetes mellitus without complications: Secondary | ICD-10-CM | POA: Diagnosis not present

## 2020-04-18 DIAGNOSIS — E785 Hyperlipidemia, unspecified: Secondary | ICD-10-CM | POA: Diagnosis not present

## 2020-04-18 DIAGNOSIS — F411 Generalized anxiety disorder: Secondary | ICD-10-CM | POA: Diagnosis not present

## 2020-04-18 DIAGNOSIS — F332 Major depressive disorder, recurrent severe without psychotic features: Secondary | ICD-10-CM

## 2020-04-18 DIAGNOSIS — Z23 Encounter for immunization: Secondary | ICD-10-CM | POA: Diagnosis not present

## 2020-04-18 DIAGNOSIS — I2510 Atherosclerotic heart disease of native coronary artery without angina pectoris: Secondary | ICD-10-CM | POA: Diagnosis not present

## 2020-04-19 ENCOUNTER — Other Ambulatory Visit (HOSPITAL_COMMUNITY): Payer: BC Managed Care – PPO | Attending: Psychiatry | Admitting: Licensed Clinical Social Worker

## 2020-04-19 ENCOUNTER — Encounter (HOSPITAL_COMMUNITY): Payer: Self-pay

## 2020-04-19 ENCOUNTER — Other Ambulatory Visit (HOSPITAL_COMMUNITY): Payer: BC Managed Care – PPO | Admitting: Occupational Therapy

## 2020-04-19 ENCOUNTER — Other Ambulatory Visit: Payer: Self-pay

## 2020-04-19 DIAGNOSIS — F17200 Nicotine dependence, unspecified, uncomplicated: Secondary | ICD-10-CM | POA: Insufficient documentation

## 2020-04-19 DIAGNOSIS — Z7982 Long term (current) use of aspirin: Secondary | ICD-10-CM | POA: Insufficient documentation

## 2020-04-19 DIAGNOSIS — F3181 Bipolar II disorder: Secondary | ICD-10-CM

## 2020-04-19 DIAGNOSIS — Z79899 Other long term (current) drug therapy: Secondary | ICD-10-CM | POA: Diagnosis not present

## 2020-04-19 DIAGNOSIS — Z7901 Long term (current) use of anticoagulants: Secondary | ICD-10-CM | POA: Diagnosis not present

## 2020-04-19 DIAGNOSIS — F411 Generalized anxiety disorder: Secondary | ICD-10-CM | POA: Diagnosis not present

## 2020-04-19 DIAGNOSIS — Z882 Allergy status to sulfonamides status: Secondary | ICD-10-CM | POA: Insufficient documentation

## 2020-04-19 DIAGNOSIS — E119 Type 2 diabetes mellitus without complications: Secondary | ICD-10-CM | POA: Diagnosis not present

## 2020-04-19 DIAGNOSIS — R41844 Frontal lobe and executive function deficit: Secondary | ICD-10-CM

## 2020-04-19 DIAGNOSIS — E785 Hyperlipidemia, unspecified: Secondary | ICD-10-CM | POA: Insufficient documentation

## 2020-04-19 DIAGNOSIS — I252 Old myocardial infarction: Secondary | ICD-10-CM | POA: Insufficient documentation

## 2020-04-19 DIAGNOSIS — F332 Major depressive disorder, recurrent severe without psychotic features: Secondary | ICD-10-CM | POA: Insufficient documentation

## 2020-04-19 DIAGNOSIS — Z794 Long term (current) use of insulin: Secondary | ICD-10-CM | POA: Insufficient documentation

## 2020-04-19 DIAGNOSIS — D649 Anemia, unspecified: Secondary | ICD-10-CM | POA: Diagnosis not present

## 2020-04-19 DIAGNOSIS — R4589 Other symptoms and signs involving emotional state: Secondary | ICD-10-CM

## 2020-04-19 MED ORDER — ARIPIPRAZOLE 5 MG PO TABS: 5.0000 mg | ORAL_TABLET | Freq: Every day | ORAL | 0 refills | Status: DC

## 2020-04-19 NOTE — Progress Notes (Signed)
Spiritual care group    11:00-12:00  Group met via web-ex due to COVID-19 precautions.  Group facilitated by Chaplain Payeton Germani, MDiv, BCC   Group focused on topic of "self-care"  Patients engaged in facilitated discussion about topic.  Explored quotes related to self care and chose one which they agreed with and one which they disliked.  Engaged in discussion around quote choices and their experience / understanding of care for themselves.   

## 2020-04-19 NOTE — Therapy (Signed)
[facility] PARTIAL HOSPITALIZATION PROGRAM [address] [place], [place], [address] Phone: [phone]   Fax:  [phone] Virtual Visit via Video Note  I connected with Alicia Phillips on 04/19/20 at  12:00 PM EDT by a video enabled telemedicine application and verified that I am speaking with the correct person using two identifiers.   I discussed the limitations of evaluation and management by telemedicine and the availability of in person appointments. The patient expressed understanding and agreed to proceed.    I discussed the assessment and treatment plan with the patient. The patient was provided an opportunity to ask questions and all were answered. The patient agreed with the plan and demonstrated an understanding of the instructions.   The patient was advised to call back or seek an in-person evaluation if the symptoms worsen or if the condition fails to improve as anticipated.  Location: Patient - Patient Home OT - Clinic Office  I provided 80 minutes of non-face-to-face time during this encounter. 30 minutes - OT Evaluation 50 minutes - OT Group Therapy   Donne Hazel, OT   Occupational Therapy Evaluation  Patient Details  Name: Alicia Phillips MRN: [id number] Date of Birth: 01-21-1981 Referring Provider (OT): Hillery Jacks   Encounter Date: 04/19/2020   OT End of Session - 04/19/20 1456    Visit Number 1    Number of Visits 20    Date for OT Re-Evaluation 05/17/20    Authorization Type BCBS    OT Start Time 1200   OT Eval [phone]   OT Stop Time 1250    OT Time Calculation (min) 50 min    Activity Tolerance Patient tolerated treatment well    Behavior During Therapy [facility] for tasks assessed/performed           Past Medical History:  Diagnosis Date  . Anxiety   . CAD (coronary artery disease)    a. LHC 04/05/20: 95% stenosis of mid LAD s/p DES, 30% stenosis of proximal to mid LAD  . Diabetes mellitus type 1 (HCC)   .  Hyperlipidemia   . Major depression   . STEMI (ST elevation myocardial infarction) (HCC) 04/05/2020  . Tobacco use     Past Surgical History:  Procedure Laterality Date  . CORONARY STENT INTERVENTION N/A 04/05/2020   Procedure: CORONARY STENT INTERVENTION;  Surgeon: Iran Ouch, MD;  Location: [facility] INVASIVE CV LAB;  Service: Cardiovascular;  Laterality: N/A;  . LEFT HEART CATH AND CORONARY ANGIOGRAPHY N/A 04/05/2020   Procedure: LEFT HEART CATH AND CORONARY ANGIOGRAPHY;  Surgeon: Iran Ouch, MD;  Location: [facility] INVASIVE CV LAB;  Service: Cardiovascular;  Laterality: N/A;    There were no vitals filed for this visit.   Subjective Assessment - 04/19/20 1454    Currently in Pain? No/denies    Pain Score 0-No pain             [facility] OT Assessment - 04/19/20 0001      Assessment   Medical Diagnosis Bipolar II    Referring Provider (OT) Hillery Jacks      Precautions   Precautions None      Balance Screen   Has the patient fallen in the past 6 months No    Has the patient had a decrease in activity level because of a fear of falling?  No    Is the patient reluctant to leave their home because of a fear of falling?  No  OT Education - 04/19/20 1454    Education Details Educated on [facility] programming, in addition to physical symptomology of stress and its effects on the body, along with positive stress management tips/strategies    Person(s) Educated Patient    Methods Explanation;Handout    Comprehension Verbalized understanding            OT Short Term Goals - 04/19/20 1500      OT SHORT TERM GOAL #1   Title Pt will actively engage in OT group sessions throughout duration of PHP programming, in order to promote daily structure, social engagement, and opportunities to develop and utilize adaptive strategies to maximize functional performance in preparation for safe transition and integration back into school, work, and  the community.    Time 4    Period Weeks    Status New    Target Date 05/17/20      OT SHORT TERM GOAL #2   Title Pt will practice and identify 1-3 adaptive coping strategies she can utilize, in order to safely manage increased depression/anxiety, with min cues, in preparation for safe and healthy reintegration back into the community at discharge.    Status New      OT SHORT TERM GOAL #3   Title Pt will develop 1-2 self-identified short-term goals she would like to achieve, by the end of PHP programming, in order to promote healthy and achievable goal-setting skills that she can utilize to hold herself accountable as she transitions back into the community at discharge.    Status New      OT SHORT TERM GOAL #4   Title Pt will demonstrate an increase in assertive communication skills, as evidenced by, identifying at least 2 "I" statements when sharing their feelings/needs, with min cues, in preparation for reintegration back into the community at discharge.    Status New          Occupational Therapy Assessment 04/19/2020  Alicia Burtonmily is a 39 year old female with PMHx of bipolar II disorder, depression, anxiety, borderline personality and several medical complications, including a recent heart attack, who [facility] with reports of worsening anxiety, depression and "feeling like I am going to die." She reports hypervigilence related to her medical complications and reports that she fears she will have another heart attack, despite multiple doctors assuring her otherwise. Throughout assessment, pt presented as tearful, although engaged and reported motivation for engaging in treatment in order to "gain skills". Pt reports enjoying art, reading, watching TV, and "making silly collages" and identifies goal for admission "gain skills".   Precautions/Limitations: Pt has a long hx of medical complications, most recently enduring a heart attack and difficulty managing her type I  diabetes.  Cognition: Appears intact   Visual Motor: Pt wears glasses full-time d/t impaired vision.   Living Situation: Pt lives alone in an apartment.  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 Pt currently works at [employer] as a [place] professor.  ADL/iADL Performance: Pt identifies difficulty engaging in basic self-care/hygiene, rating herself as a "3 or 4 out of 10."   Leisure Interests and Hobbies: Enjoys reading, watching TV, making kombucha, cooking, plants, yoga, and art.   Social Support: Pt identifies seeking support from her Mom, sister, brothers, and close friends from childhood. One of patient's brothers [place] in [place], [place].  What do you do when you are very stressed, angry, upset, sad or anxious? Isolate from others, Use drugs/alcohol, Cry, Talk to someone and Act out   Pt reports smoking marijuana and drinking beers occasionally.   What helps when you are not feeling well? Deep breathing, Calling a friend or family member, Reading and Eating something  What are some things that make it MORE difficult for you when you are already upset? Not having choices/input, Being alone/isolated, Bedroom door being open, Not being able to express my opinion, Loud noises and Being criticized  Is there anything specific that you would like help with while you're in the hospital? Coping Skills, Relationships, Self-Harm Urges, Self-Care, Goal-setting, Self-esteem  and Emotion Regulation  What is your goal while you are here?  "Gain skills"  Assessment: Pt demonstrates behavior that inhibits/restricts participation in occupation and would benefit from skilled occupational therapy services to address current difficulties with symptom management, emotion regulation, socialization, stress management, time management, job readiness, financial  wellness, health and nutrition, sleep hygiene, ADL/iADL performance and leisure participation, in preparation for reintegration and return to community at discharge.   Plan: Pt will participate in skilled occupational therapy sessions (group and/or individual) in order to promote daily structure, social engagement, and opportunities to develop and utilize adaptive strategies to maximize functional performance in preparation for safe transition and integration back into school, work, and/or the community at discharge. OT sessions will occur 4-5 x per week for 2-4 weeks.   Donne Hazel, MOT, OTR/L  Group Session:  S: "I'm at a 9, I have a lot going on."  O: Today's discussion focused on the topic of stress management. Group members worked collaboratively to create a Microbiologist identifying physical signs, behavioral signs, emotional/psychological, and cognitive signs of stress. Discussion then focused and encouraged group members to identify positive stress management strategies they could utilize in those moments to manage identified signs.  A: Inita was active in her participation of discussion and activity and identified her current stress level as a 9 out of 10. She identified several psychosocial stressors including recent medical complications, having had a heart attack recently, and managing her diabetes. She shared that it is difficult to manage the medical complications alongside her mental health and appeared to relate well with another group member with a similar medical background. She shared that one ways to manages is through mindfulness and grounding, though recognized she does always think to do those in the moment. Pt identified stress to her as "overwhelming" and reports "I cry at least once, if not more, every day, because of everything I am trying to manage." Pt also identified difficulty financially and maintaining consistent employment as an added stress factor. She  appeared receptive to support and education from other members and group leader.    P: Continue to attend PHP OT group sessions 5x week for 4 weeks to promote daily structure, social engagement, and opportunities to develop and utilize adaptive strategies to maximize functional performance in preparation for safe transition and integration back into school, work, and the community.      Plan - 04/19/20 1457    Clinical Impression Statement Pt is a 39 y/o female with PMHx of depression, anxiety, bipolar 1 disorder, and borderline personality who reports increased psychosocial stressors including recent medical complications (recently hospitalized for a heart attack), financial difficulties, employment/unemployment stress, balancing work/life responsibilities, and managing her mental health. Pt reports a desire to engage in [facility] programming in order to  manage identified stressors and to engage meaningfully in identified areas of occupation and ADL/iADLs.    OT Occupational Profile and History Problem Focused Assessment - Including review of records relating to presenting problem    Occupational performance deficits (Please refer to evaluation for details): ADL's;IADL's;Rest and Sleep;Education;Work;Leisure;Social Participation    Body Structure / Function / Physical Skills ADL    Cognitive Skills Attention;Consciousness;Emotional;Energy/Drive;Memory;Perception;Problem Solve;Safety Awareness;Temperament/Personality;Thought;Understand    Psychosocial Skills Coping Strategies;Environmental  Adaptations;Interpersonal Interaction;Routines and Behaviors;Habits    Rehab Potential Good    Clinical Decision Making Limited treatment options, no task modification necessary    Comorbidities Affecting Occupational Performance: May have comorbidities impacting occupational performance    Modification or Assistance to Complete Evaluation  No modification of tasks or assist necessary to complete eval    OT Frequency  5x / week    OT Duration 4 weeks    OT Treatment/Interventions Self-care/ADL training;Patient/family education;Coping strategies training;Psychosocial skills training    Consulted and Agree with Plan of Care Patient           Patient will benefit from skilled therapeutic intervention in order to improve the following deficits and impairments:   Body Structure / Function / Physical Skills: ADL Cognitive Skills: Attention, Consciousness, Emotional, Energy/Drive, Memory, Perception, Problem Solve, Safety Awareness, Temperament/Personality, Thought, Understand Psychosocial Skills: Coping Strategies, Environmental  Adaptations, Interpersonal Interaction, Routines and Behaviors, Habits   Visit Diagnosis: Difficulty coping  Frontal lobe and executive function deficit  Bipolar II disorder [facility])    Problem List Patient Active Problem List   Diagnosis Date Noted  . Tobacco abuse 04/06/2020  . Hyperlipidemia 04/06/2020  . CAD (coronary artery disease) 04/06/2020  . STEMI (ST elevation myocardial infarction) (HCC) 04/05/2020  . Depressive disorder 12/06/2016  . Type 1 diabetes mellitus (HCC) 11/04/2016    04/19/2020  Donne Hazel, MOT, OTR/L  04/19/2020, 3:03 PM  [facility] PARTIAL HOSPITALIZATION PROGRAM [address] [place], [place], [address] Phone: [phone]   Fax:  [phone]  Name: KYLIEANN EAGLES MRN: [id number] Date of Birth: 16-Aug-1980

## 2020-04-19 NOTE — Psych (Signed)
Virtual Visit via Video Note  I connected with Alicia Phillips on 04/19/20 at  9:00 AM EDT by a video enabled telemedicine application and verified that I am speaking with the correct person using two identifiers.  Location: Patient: Patient Home Provider: Clinical Home Office    I discussed the limitations of evaluation and management by telemedicine and the availability of in person appointments. The patient expressed understanding and agreed to proceed.  I discussed the assessment and treatment plan with the patient. The patient was provided an opportunity to ask questions and all were answered. The patient agreed with the plan and demonstrated an understanding of the instructions.   The patient was advised to call back or seek an in-person evaluation if the symptoms worsen or if the condition fails to improve as anticipated.  I provided 15 minutes of non-face-to-face time during this encounter.  Pt and provider completed treatment plan. Pt states verbal consent to treatment and alignment with treatment plan.   Donia Guiles, LCSW

## 2020-04-19 NOTE — Progress Notes (Signed)
Virtual Visit via Video Note  I connected with Alicia Phillips on 04/19/20 at  9:00 AM EDT by a video enabled telemedicine application and verified that I am speaking with the correct person using two identifiers.   I discussed the limitations of evaluation and management by telemedicine and the availability of in person appointments. The patient expressed understanding and agreed to proceed.   I discussed the assessment and treatment plan with the patient. The patient was provided an opportunity to ask questions and all were answered. The patient agreed with the plan and demonstrated an understanding of the instructions.   The patient was advised to call back or seek an in-person evaluation if the symptoms worsen or if the condition fails to improve as anticipated.  I provided 15 minutes of non-face-to-face time during this encounter.   Alicia Rack, NP    Behavioral Health Partial Program Assessment Note  Date: 04/19/2020 Name: Alicia Phillips MRN: [id number]    HPI: Alicia Phillips is a 39 y.o. Caucasian female presents with worsening depression and anxiety.  Patient appears anxious and is tearful throughout this assessment.  Margaretha reports she was referred by her general practitioner due to frequent panic and anxiety attacks.  She reports she experienced a heart attack which is now caused her to be hypervigilant with her medical history.  States her primary care provider prescribes Lamictal and hydroxyzine for mood stabilization.  She reports she was diagnosed with depressed, bipolar disorder and borderline personality disorder.  She denies previous inpatient admissions.  Denies previous suicide or self-harm attempts.  Patient reports substance abuse with marijuana and occasional alcohol use socially.  Denied any other illicit drugs.  Denies history with physical or sexual abuse in the past.  Does report a family history of mental illness.  As she reports father: Depression reported  unofficial diagnosis with depression and anxiety and substances abuse on paternal side.  Patient was enrolled in partial psychiatric program on 04/19/20.  Primary complaints include: anxiety, anxiety attacks and difficulty sleeping.  Onset of symptoms was gradual with gradually worsening course since that time. Psychosocial Stressors include the following: family.   I have reviewed the following documentation dated 04/19/2020: past psychiatric history, past medical history and past social and family history  Complaints of Pain: nonear Past Psychiatric History:  Past medication trials Seroquel  Currently in treatment with Lamictal,hydroxyzine discussed initiating Abilify.  Substance Abuse History: alcohol and marijuana Use of Alcohol: Reported occasional use Use of Caffeine: denies use Use of over the counter:   Past Surgical History:  Procedure Laterality Date  . CORONARY STENT INTERVENTION N/A 04/05/2020   Procedure: CORONARY STENT INTERVENTION;  Surgeon: Iran Ouch, MD;  Location: [facility] INVASIVE CV LAB;  Service: Cardiovascular;  Laterality: N/A;  . LEFT HEART CATH AND CORONARY ANGIOGRAPHY N/A 04/05/2020   Procedure: LEFT HEART CATH AND CORONARY ANGIOGRAPHY;  Surgeon: Iran Ouch, MD;  Location: [facility] INVASIVE CV LAB;  Service: Cardiovascular;  Laterality: N/A;    Past Medical History:  Diagnosis Date  . Anxiety   . CAD (coronary artery disease)    a. LHC 04/05/20: 95% stenosis of mid LAD s/p DES, 30% stenosis of proximal to mid LAD  . Diabetes mellitus type 1 (HCC)   . Hyperlipidemia   . Major depression   . STEMI (ST elevation myocardial infarction) (HCC) 04/05/2020  . Tobacco use    Outpatient Encounter Medications as of 04/19/2020  Medication Sig Note  . ACETAMINOPHEN EXTRA STRENGTH 500 MG  tablet Take 500 mg by mouth every 4 (four) hours as needed for mild pain or headache.    Marland Kitchen aspirin 81 MG chewable tablet Chew 1 tablet (81 mg total) by mouth daily.   Marland Kitchen atorvastatin  (LIPITOR) 80 MG tablet Take 1 tablet (80 mg total) by mouth daily.   Marland Kitchen doxycycline (VIBRAMYCIN) 50 MG capsule Take 50 mg by mouth daily. 04/11/2020: Pt needs refilled   . insulin aspart (NOVOLOG) 100 UNIT/ML injection Inject 0-20 Units into the skin 3 (three) times daily before meals. Per sliding scale   . lamoTRIgine (LAMICTAL) 100 MG tablet Take one tablet at bedtime. (Patient taking differently: Take 100 mg by mouth at bedtime. Take one tablet at bedtime.)   . metoprolol succinate (TOPROL-XL) 25 MG 24 hr tablet Take 1 tablet (25 mg total) by mouth daily. Take with or immediately following a meal.   . nitroGLYCERIN (NITROSTAT) 0.4 MG SL tablet Place 1 tablet (0.4 mg total) under the tongue every 5 (five) minutes as needed for chest pain.   . ticagrelor (BRILINTA) 90 MG TABS tablet Take 1 tablet (90 mg total) by mouth 2 (two) times daily.   Evaristo Bury FLEXTOUCH 100 UNIT/ML FlexTouch Pen Inject 25 Units into the skin daily.     Facility-Administered Encounter Medications as of 04/19/2020  Medication  . iron polysaccharides (NIFEREX) capsule 150 mg   Allergies  Allergen Reactions  . Sulfamethoxazole-Trimethoprim Rash    Social History   Tobacco Use  . Smoking status: Current Every Day Smoker  . Smokeless tobacco: Never Used  Substance Use Topics  . Alcohol use: Never   Functioning Relationships: good support system Education: Other   Other Pertinent History: None Family History  Problem Relation Age of Onset  . Breast cancer Maternal Grandmother      Review of Systems Constitutional: negative  Objective:  There were no vitals filed for this visit.  Physical Exam:   Mental Status Exam: Appearance:  Well groomed Psychomotor::  Within Normal Limits Attention span and concentration: Normal Behavior: calm, cooperative and adequate rapport can be established Speech:  normal volume Mood:  depressed and anxious Affect:  normal Thought Process:  Coherent Thought Content:   Hallucinations: None Orientation:  person, place and time/date Cognition:  grossly intact Insight:  Intact Judgment:  Intact Estimate of Intelligence: Average Fund of knowledge: Aware of current events Memory: Recent and remote intact Abnormal movements: None Gait and station:   Assessment:  Diagnosis: Severe episode of recurrent major depressive disorder, without psychotic features (HCC) [F33.2] 1. Severe episode of recurrent major depressive disorder, without psychotic features (HCC)   2. GAD (generalized anxiety disorder)     Indications for admission: inpatient care required if not in partial hospital program  Plan:  Orders placed for occupational therapy patient enrolled in Partial Hospitalization Program, patient's current medications are to be continued, the following medications are being prescribed Abilify 5mg  daily, a comprehensive treatment plan will be developed and side effects of medications have been reviewed with patient  Treatment options and alternatives reviewed with patient and patient understands the above plan.  Treatment plan was reviewed and agreed upon by NP T. and patient Sheralyn Pigue's need for group services   Irving Burton, NP

## 2020-04-19 NOTE — Progress Notes (Signed)
Virtual Visit via Video Note  I connected with Alicia Phillips on 04/18/20 at  1:30 PM EDT by a video enabled telemedicine application and verified that I am speaking with the correct person using two identifiers.   Location: Patient: Patient Home Provider: Clinical Home Office   I discussed the limitations of evaluation and management by telemedicine and the availability of in person appointments. The patient expressed understanding and agreed to proceed.  I discussed the assessment and treatment plan with the patient. The patient was provided an opportunity to ask questions and all were answered. The patient agreed with the plan and demonstrated an understanding of the instructions.   The patient was advised to call back or seek an in-person evaluation if the symptoms worsen or if the condition fails to improve as anticipated.  I provided 90 minutes of non-face-to-face time during this encounter.   Donia Guiles, LCSW    Comprehensive Clinical Assessment (CCA) Note  04/19/2020 Alicia Phillips [id number]  Visit Diagnosis:      ICD-10-CM   1. GAD (generalized anxiety disorder)  F41.1   2. Severe episode of recurrent major depressive disorder, without psychotic features (HCC)  F33.2       CCA Biopsychosocial  Intake/Chief Complaint:  CCA Intake With Chief Complaint CCA Part Two Date: 04/18/20 CCA Part Two Time: 1330 Chief Complaint/Presenting Problem: Pt presents as referral from [facility]. Pt states she was encouraged to seek help from her medical provider after having a panic attack in the provider's office. Pt reports history of MH diagnoses of Bipolar 2, borderline personality disorder, and anxiety. Pt reports mental health struggles growing for at least the last year, but it was managable. Pt shares that on 9/7 she had a heart attack and that was the catalyst to her current issues. Pt reports she feels betrayed by her body and has lost some trust in medical providers during the  process. Pt states panic feelings and belief that "I'm going to die soon and there's nothing I can do about it." Pt is tearful throughout session. Pt reports comorbid medical diagnoses of Type 1 diabetes and an autoimmune disorder, as well as her current heart problems. Pt reports after her heart attack, tests showed a 95% blockage in an artery and a stint was put in. Pt also reports historical struggle with diabetic builemia. Pt reports some passive SI and denies intent or plan. Pt denies AVH. Patient's Currently Reported Symptoms/Problems: Pt reports insomnia, panic, excessive worry, hypervigilance, decreased appetite and energy, depressed mood, tearfulness Individual's Strengths: Pt is motivated for treatment, has some insight, has some support, is employed Individual's Preferences: Pt prefers intensive treatment  Mental Health Symptoms Depression:  Depression: Change in energy/activity, Difficulty Concentrating, Fatigue, Hopelessness, Increase/decrease in appetite, Sleep (too much or little), Tearfulness, Duration of symptoms greater than two weeks  Mania:  Mania: None  Anxiety:   Anxiety: Difficulty concentrating, Fatigue, Restlessness, Sleep, Tension, Worrying  Psychosis:  Psychosis: None  Trauma:  Trauma: Re-experience of traumatic event, Difficulty staying/falling asleep, Hypervigilance  Obsessions:  Obsessions: None  Compulsions:  Compulsions: None  Inattention:  Inattention: None  Hyperactivity/Impulsivity:  Hyperactivity/Impulsivity: N/A  Oppositional/Defiant Behaviors:  Oppositional/Defiant Behaviors: None  Emotional Irregularity:  Emotional Irregularity: Chronic feelings of emptiness, Mood lability, Unstable self-image, Intense/unstable relationships  Other Mood/Personality Symptoms:      Mental Status Exam Appearance and self-care  Stature:  Stature: Average  Weight:  Weight: Average weight  Clothing:  Clothing: Casual  Grooming:  Grooming: Normal  Cosmetic use:  Cosmetic Use:  Age appropriate  Posture/gait:  Posture/Gait: Normal  Motor activity:  Motor Activity: Restless  Sensorium  Attention:  Attention: Normal  Concentration:  Concentration: Anxiety interferes, Scattered  Orientation:  Orientation: X5  Recall/memory:  Recall/Memory: Normal  Affect and Mood  Affect:  Affect: Anxious, Tearful, Labile  Mood:  Mood: Anxious, Depressed  Relating  Eye contact:  Eye Contact: Normal  Facial expression:  Facial Expression: Responsive  Attitude toward examiner:  Attitude Toward Examiner: Cooperative  Thought and Language  Speech flow: Speech Flow: Normal  Thought content:  Thought Content: Appropriate to Mood and Circumstances  Preoccupation:  Preoccupations: Somatic  Hallucinations:  Hallucinations: None  Organization:     Company secretary of Knowledge:  Fund of Knowledge: Average  Intelligence:  Intelligence: Average  Abstraction:  Abstraction: Normal  Judgement:  Judgement: Fair  Dance movement psychotherapist:  Reality Testing: Adequate  Insight:  Insight: Fair, Flashes of insight  Decision Making:  Decision Making: Vacilates  Social Functioning  Social Maturity:     Social Judgement:     Stress  Stressors:  Stressors: Illness, Surveyor, quantity, Work, Relationship, Transitions  Coping Ability:  Coping Ability: Building surveyor Deficits:  Skill Deficits: Interpersonal, Self-care  Supports:  Supports: Family     Religion: Religion/Spirituality Are You A Religious Person?: No  Leisure/Recreation: Leisure / Recreation Do You Have Hobbies?: Yes Leisure and Hobbies: creative outlets  Exercise/Diet: Exercise/Diet Have You Gained or Lost A Significant Amount of Weight in the Past Six Months?: No Do You Follow a Special Diet?: No Do You Have Any Trouble Sleeping?: Yes Explanation of Sleeping Difficulties: Pt reports difficulty falling and staying asleep   CCA Employment/Education  Employment/Work Situation: Employment / Work Situation Employment  situation: Employed Where is patient currently employed?: [employer] Patient's job has been impacted by current illness: Yes Describe how patient's job has been impacted: Pt is currently out due to medical and MH issues Has patient ever been in the [employer]?: No  Education: Education Is Patient Currently Attending School?: No Did Garment/textile technologist From [employer]?: Yes Did Theme park manager?: Yes Did Designer, television/film set?: Yes Did You Have An Individualized Education Program (IIEP): No Did You Have Any Difficulty At [employer]?: No Patient's Education Has Been Impacted by Current Illness: No   CCA Family/Childhood History  Family and Relationship History: Family history Marital status: Single Does patient have children?: No  Childhood History:  Childhood History By whom was/is the patient raised?: Both parents Patient's description of current relationship with people who raised him/her: Pt lists mom as a support Does patient have siblings?: Yes Number of Siblings: 3 Description of patient's current relationship with siblings: Pt reports she is close with her 3 siblings Did patient suffer any verbal/emotional/physical/sexual abuse as a child?: No Did patient suffer from severe childhood neglect?: No Has patient ever been sexually abused/assaulted/raped as an adolescent or adult?: No Was the patient ever a victim of a crime or a disaster?: No Witnessed domestic violence?: No Has patient been affected by domestic violence as an adult?: No  Child/Adolescent Assessment:     CCA Substance Use  Alcohol/Drug Use: Alcohol / Drug Use Pain Medications: Pt denies Prescriptions: see MAR Over the Counter: pt denies History of alcohol / drug use?: No history of alcohol / drug abuse (Pt reports occasional alcohol and marijuana use and denies diagnostic criteria)  ASAM's:  Six Dimensions of Multidimensional Assessment  Dimension 1:  Acute  Intoxication and/or Withdrawal Potential:      Dimension 2:  Biomedical Conditions and Complications:      Dimension 3:  Emotional, Behavioral, or Cognitive Conditions and Complications:     Dimension 4:  Readiness to Change:     Dimension 5:  Relapse, Continued use, or Continued Problem Potential:     Dimension 6:  Recovery/Living Environment:     ASAM Severity Score:    ASAM Recommended Level of Treatment:     Substance use Disorder (SUD)    Recommendations for Services/Supports/Treatments: Recommendations for Services/Supports/Treatments Recommendations For Services/Supports/Treatments: Partial Hospitalization (Pt is recommended PHP due to decrease in functioning, increased symptomology, and need for stabilization)  DSM5 Diagnoses: Patient Active Problem List   Diagnosis Date Noted  . Tobacco abuse 04/06/2020  . Hyperlipidemia 04/06/2020  . CAD (coronary artery disease) 04/06/2020  . STEMI (ST elevation myocardial infarction) (HCC) 04/05/2020  . Depressive disorder 12/06/2016  . Type 1 diabetes mellitus (HCC) 11/04/2016    Patient Centered Plan: Patient is on the following Treatment Plan(s):  Anxiety   Referrals to Alternative Service(s): Referred to Alternative Service(s):   Place:   Date:   Time:    Referred to Alternative Service(s):   Place:   Date:   Time:    Referred to Alternative Service(s):   Place:   Date:   Time:    Referred to Alternative Service(s):   Place:   Date:   Time:     Donia Guiles

## 2020-04-20 ENCOUNTER — Other Ambulatory Visit: Payer: Self-pay

## 2020-04-20 ENCOUNTER — Other Ambulatory Visit (HOSPITAL_COMMUNITY): Payer: BC Managed Care – PPO | Admitting: Occupational Therapy

## 2020-04-20 ENCOUNTER — Encounter (HOSPITAL_COMMUNITY): Payer: Self-pay

## 2020-04-20 ENCOUNTER — Other Ambulatory Visit (HOSPITAL_COMMUNITY): Payer: BC Managed Care – PPO | Admitting: Licensed Clinical Social Worker

## 2020-04-20 DIAGNOSIS — I252 Old myocardial infarction: Secondary | ICD-10-CM | POA: Diagnosis not present

## 2020-04-20 DIAGNOSIS — Z7901 Long term (current) use of anticoagulants: Secondary | ICD-10-CM | POA: Diagnosis not present

## 2020-04-20 DIAGNOSIS — Z79899 Other long term (current) drug therapy: Secondary | ICD-10-CM | POA: Diagnosis not present

## 2020-04-20 DIAGNOSIS — E785 Hyperlipidemia, unspecified: Secondary | ICD-10-CM | POA: Diagnosis not present

## 2020-04-20 DIAGNOSIS — Z882 Allergy status to sulfonamides status: Secondary | ICD-10-CM | POA: Diagnosis not present

## 2020-04-20 DIAGNOSIS — F3181 Bipolar II disorder: Secondary | ICD-10-CM

## 2020-04-20 DIAGNOSIS — F411 Generalized anxiety disorder: Secondary | ICD-10-CM

## 2020-04-20 DIAGNOSIS — E119 Type 2 diabetes mellitus without complications: Secondary | ICD-10-CM | POA: Diagnosis not present

## 2020-04-20 DIAGNOSIS — Z7982 Long term (current) use of aspirin: Secondary | ICD-10-CM | POA: Diagnosis not present

## 2020-04-20 DIAGNOSIS — F331 Major depressive disorder, recurrent, moderate: Secondary | ICD-10-CM

## 2020-04-20 DIAGNOSIS — F17200 Nicotine dependence, unspecified, uncomplicated: Secondary | ICD-10-CM | POA: Diagnosis not present

## 2020-04-20 DIAGNOSIS — F332 Major depressive disorder, recurrent severe without psychotic features: Secondary | ICD-10-CM | POA: Diagnosis not present

## 2020-04-20 DIAGNOSIS — R4589 Other symptoms and signs involving emotional state: Secondary | ICD-10-CM

## 2020-04-20 DIAGNOSIS — R41844 Frontal lobe and executive function deficit: Secondary | ICD-10-CM

## 2020-04-20 DIAGNOSIS — Z794 Long term (current) use of insulin: Secondary | ICD-10-CM | POA: Diagnosis not present

## 2020-04-20 NOTE — Therapy (Signed)
[facility] PARTIAL HOSPITALIZATION PROGRAM [address] [place], [place], [address] Phone: [phone]   Fax:  [phone] Virtual Visit via Video Note  I connected with Alicia Phillips on 04/20/20 at  11:00 AM EDT by a video enabled telemedicine application and verified that I am speaking with the correct person using two identifiers.   I discussed the limitations of evaluation and management by telemedicine and the availability of in person appointments. The patient expressed understanding and agreed to proceed.    I discussed the assessment and treatment plan with the patient. The patient was provided an opportunity to ask questions and all were answered. The patient agreed with the plan and demonstrated an understanding of the instructions.   The patient was advised to call back or seek an in-person evaluation if the symptoms worsen or if the condition fails to improve as anticipated.  Location: Patient - Patient Home OT - Clinic Office   I provided 60 minutes of non-face-to-face time during this encounter.   Donne Hazel, OT   Occupational Therapy Treatment  Patient Details  Name: Alicia Phillips MRN: [id number] Date of Birth: 01-01-1981 Referring Provider (OT): Hillery Jacks   Encounter Date: 04/20/2020   OT End of Session - 04/20/20 1553    Visit Number 2    Number of Visits 20    Date for OT Re-Evaluation 05/17/20    Authorization Type BCBS    OT Start Time 1100    OT Stop Time 1200    OT Time Calculation (min) 60 min    Activity Tolerance Patient tolerated treatment well    Behavior During Therapy [facility] for tasks assessed/performed           Past Medical History:  Diagnosis Date  . Anxiety   . CAD (coronary artery disease)    a. LHC 04/05/20: 95% stenosis of mid LAD s/p DES, 30% stenosis of proximal to mid LAD  . Diabetes mellitus type 1 (HCC)   . Hyperlipidemia   . Major depression   . STEMI (ST elevation myocardial  infarction) (HCC) 04/05/2020  . Tobacco use     Past Surgical History:  Procedure Laterality Date  . CORONARY STENT INTERVENTION N/A 04/05/2020   Procedure: CORONARY STENT INTERVENTION;  Surgeon: Iran Ouch, MD;  Location: [facility] INVASIVE CV LAB;  Service: Cardiovascular;  Laterality: N/A;  . LEFT HEART CATH AND CORONARY ANGIOGRAPHY N/A 04/05/2020   Procedure: LEFT HEART CATH AND CORONARY ANGIOGRAPHY;  Surgeon: Iran Ouch, MD;  Location: [facility] INVASIVE CV LAB;  Service: Cardiovascular;  Laterality: N/A;    There were no vitals filed for this visit.   Subjective Assessment - 04/20/20 1553    Currently in Pain? No/denies    Pain Score 0-No pain                                OT Education - 04/20/20 1553    Education Details Educated on concept of sensory modulation and self-soothing as coping strategies through use of the five senses    Person(s) Educated Patient    Methods Explanation;Handout    Comprehension Verbalized understanding            OT Short Term Goals - 04/20/20 1554      OT SHORT TERM GOAL #1   Status On-going      OT SHORT TERM GOAL #2   Status On-going  OT SHORT TERM GOAL #3   Status On-going      OT SHORT TERM GOAL #4   Status On-going         Group Session:  S: "I'm having trouble identifying what is calming versus alerting to me, does that mean I am weird? Or an effect of my bipolar?"  O: Today's group session focused on topic of sensory modulation and self-soothing through use of the 8 senses. Discussion introduced the concept of sensory modulation and integration, focusing on how we can utilize our body and it's senses to self-soothe or cope, when we are experiencing an over or under-whelming sensation or feeling. Group members were introduced to a sensory diet checklist as a helpful tool/resource that can be utilized to identify what activities and strategies we prefer and do not prefer based upon our response to  different stimulus. The concept of alerting vs calming activities was also introduced to understand how to counteract how we are feeling (Example: when we are feeling overwhelmed/stressed, engage in something calming. When we are feeling depressed/low energy, engage in something alerting). Group members engaged actively in discussion sharing their own personal sensory likes/dislikes.    A: Marce was active and independent in her participation of discussion and activity, sharing that she had never thought to look at her coping strategies as calming her down or motivating her. As discussion continued, she shared that she often tries to match her mood with the activity and noted "That is part of my borderline (personality)". She identified dancing as a helpful strategy to motivate herself and manage her depression, though noted difficulty "just doing it." She appeared receptive to further education and strategies reviewed to continue exploring additional sensory preferences. Pt expressed intention of creating her own sensory diet checklist.   P: Continue to attend PHP OT group sessions 5x week for 3 weeks to promote daily structure, social engagement, and opportunities to develop and utilize adaptive strategies to maximize functional performance in preparation for safe transition and integration back into school, work, and the community.    Plan - 04/20/20 1554    Occupational performance deficits (Please refer to evaluation for details): ADL's;IADL's;Rest and Sleep;Education;Work;Leisure;Social Participation    Body Structure / Function / Physical Skills ADL    Cognitive Skills Attention;Consciousness;Emotional;Energy/Drive;Memory;Perception;Problem Solve;Safety Awareness;Temperament/Personality;Thought;Understand    Psychosocial Skills Coping Strategies;Environmental  Adaptations;Interpersonal Interaction;Routines and Behaviors;Habits           Patient will benefit from skilled therapeutic  intervention in order to improve the following deficits and impairments:   Body Structure / Function / Physical Skills: ADL Cognitive Skills: Attention, Consciousness, Emotional, Energy/Drive, Memory, Perception, Problem Solve, Safety Awareness, Temperament/Personality, Thought, Understand Psychosocial Skills: Coping Strategies, Environmental  Adaptations, Interpersonal Interaction, Routines and Behaviors, Habits   Visit Diagnosis: Difficulty coping  Frontal lobe and executive function deficit  Bipolar II disorder [facility])    Problem List Patient Active Problem List   Diagnosis Date Noted  . Tobacco abuse 04/06/2020  . Hyperlipidemia 04/06/2020  . CAD (coronary artery disease) 04/06/2020  . STEMI (ST elevation myocardial infarction) (HCC) 04/05/2020  . Depressive disorder 12/06/2016  . Type 1 diabetes mellitus (HCC) 11/04/2016    04/20/2020  Donne Hazel, MOT, OTR/L  04/20/2020, 3:54 PM  [facility] PARTIAL HOSPITALIZATION PROGRAM [address] [place], [place], [address] Phone: [phone]   Fax:  [phone]  Name: YAZMIN LOCHER MRN: [id number] Date of Birth: November 23, 1980

## 2020-04-21 ENCOUNTER — Other Ambulatory Visit: Payer: Self-pay

## 2020-04-21 ENCOUNTER — Other Ambulatory Visit (HOSPITAL_COMMUNITY): Payer: BC Managed Care – PPO | Admitting: Licensed Clinical Social Worker

## 2020-04-21 ENCOUNTER — Encounter (HOSPITAL_COMMUNITY): Payer: Self-pay | Admitting: Family

## 2020-04-21 ENCOUNTER — Encounter (HOSPITAL_COMMUNITY): Payer: Self-pay

## 2020-04-21 ENCOUNTER — Other Ambulatory Visit (HOSPITAL_COMMUNITY): Payer: BC Managed Care – PPO | Admitting: Occupational Therapy

## 2020-04-21 DIAGNOSIS — R4589 Other symptoms and signs involving emotional state: Secondary | ICD-10-CM

## 2020-04-21 DIAGNOSIS — Z7982 Long term (current) use of aspirin: Secondary | ICD-10-CM | POA: Diagnosis not present

## 2020-04-21 DIAGNOSIS — F411 Generalized anxiety disorder: Secondary | ICD-10-CM | POA: Diagnosis not present

## 2020-04-21 DIAGNOSIS — F332 Major depressive disorder, recurrent severe without psychotic features: Secondary | ICD-10-CM | POA: Diagnosis not present

## 2020-04-21 DIAGNOSIS — F3181 Bipolar II disorder: Secondary | ICD-10-CM

## 2020-04-21 DIAGNOSIS — Z794 Long term (current) use of insulin: Secondary | ICD-10-CM | POA: Diagnosis not present

## 2020-04-21 DIAGNOSIS — F17200 Nicotine dependence, unspecified, uncomplicated: Secondary | ICD-10-CM | POA: Diagnosis not present

## 2020-04-21 DIAGNOSIS — Z79899 Other long term (current) drug therapy: Secondary | ICD-10-CM | POA: Diagnosis not present

## 2020-04-21 DIAGNOSIS — Z7901 Long term (current) use of anticoagulants: Secondary | ICD-10-CM | POA: Diagnosis not present

## 2020-04-21 DIAGNOSIS — R41844 Frontal lobe and executive function deficit: Secondary | ICD-10-CM

## 2020-04-21 DIAGNOSIS — F331 Major depressive disorder, recurrent, moderate: Secondary | ICD-10-CM

## 2020-04-21 DIAGNOSIS — E785 Hyperlipidemia, unspecified: Secondary | ICD-10-CM | POA: Diagnosis not present

## 2020-04-21 DIAGNOSIS — I252 Old myocardial infarction: Secondary | ICD-10-CM | POA: Diagnosis not present

## 2020-04-21 DIAGNOSIS — E119 Type 2 diabetes mellitus without complications: Secondary | ICD-10-CM | POA: Diagnosis not present

## 2020-04-21 DIAGNOSIS — Z882 Allergy status to sulfonamides status: Secondary | ICD-10-CM | POA: Diagnosis not present

## 2020-04-21 NOTE — Therapy (Signed)
[facility] PARTIAL HOSPITALIZATION PROGRAM [address] [place], [place], [address] Phone: [phone]   Fax:  [phone] Virtual Visit via Video Note  I connected with Alicia Phillips on 04/21/20 at  11:00 AM EDT by a video enabled telemedicine application and verified that I am speaking with the correct person using two identifiers.   I discussed the limitations of evaluation and management by telemedicine and the availability of in person appointments. The patient expressed understanding and agreed to proceed.    I discussed the assessment and treatment plan with the patient. The patient was provided an opportunity to ask questions and all were answered. The patient agreed with the plan and demonstrated an understanding of the instructions.   The patient was advised to call back or seek an in-person evaluation if the symptoms worsen or if the condition fails to improve as anticipated.  Location: Patient - Patient Home OT - Clinic Office   I provided 60 minutes of non-face-to-face time during this encounter.   Alicia Phillips, OT   Occupational Therapy Treatment  Patient Details  Name: Alicia Phillips MRN: [id number] Date of Birth: [birth year] Referring Provider (OT): Alicia Phillips   Encounter Date: 04/21/2020   OT End of Session - 04/21/20 1212    Visit Number 3    Number of Visits 20    Date for OT Re-Evaluation 05/17/20    Authorization Type BCBS    OT Start Time 1045    OT Stop Time 1145    OT Time Calculation (min) 60 min    Activity Tolerance Patient tolerated treatment well    Behavior During Therapy [facility] for tasks assessed/performed           Past Medical History:  Diagnosis Date  . Anxiety   . Bipolar disorder (HCC)   . CAD (coronary artery disease)    a. LHC 04/05/20: 95% stenosis of mid LAD s/p DES, 30% stenosis of proximal to mid LAD  . Diabetes mellitus type 1 (HCC)   . History of borderline personality disorder   .  Hyperlipidemia   . Major depression   . STEMI (ST elevation myocardial infarction) (HCC) 04/05/2020  . Tobacco use     Past Surgical History:  Procedure Laterality Date  . CORONARY STENT INTERVENTION N/A 04/05/2020   Procedure: CORONARY STENT INTERVENTION;  Surgeon: Iran Ouch, MD;  Location: [facility] INVASIVE CV LAB;  Service: Cardiovascular;  Laterality: N/A;  . LEFT HEART CATH AND CORONARY ANGIOGRAPHY N/A 04/05/2020   Procedure: LEFT HEART CATH AND CORONARY ANGIOGRAPHY;  Surgeon: Iran Ouch, MD;  Location: [facility] INVASIVE CV LAB;  Service: Cardiovascular;  Laterality: N/A;    There were no vitals filed for this visit.   Subjective Assessment - 04/21/20 1212    Currently in Pain? No/denies    Pain Score 0-No pain                                OT Education - 04/21/20 1212    Education Details Educated on safety planning and community mental health resources    Person(s) Educated Patient    Methods Explanation;Handout    Comprehension Verbalized understanding            OT Short Term Goals - 04/20/20 1554      OT SHORT TERM GOAL #1   Status On-going      OT SHORT TERM GOAL #2  Status On-going      OT SHORT TERM GOAL #3   Status On-going      OT SHORT TERM GOAL #4   Status On-going         Group Session:  S: "I think one of my biggest warning signs is catastrophic thinking and thinking the worst is happening to me."  O: Today's group discussion focused on the topic of Safety Planning. Patients were educated on what a safety plan is, what it can be used for, and why we should create one. Group then worked collaboratively and independently to create an individualized safety plan including identifying warning signs, coping strategies, places to go for distraction, social supports, professional supports, and how to make the environment safe. Group members were also encouraged to reflect on the question "What is life worth living for?" Group  session ended with patients encouraged to utilize their safety plan and answered any questions/concerns going into the long holiday weekend.    A: Alicia Phillips was active in her participation of discussion and activity, though tearful throughout. 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 She identified one of her biggest warning signs as "catastrophic thinking" and shared that when she thinks the worst of a situation or something occurring, it brings her down a dark tunnel that is difficult to get out of. She identified helpful coping strategies as "breathing practices, stretching, and going for a walk to look at old houses in the neighborhood." She also shared that one way she self-soothed yesterday was putting on her hoodie, wrapping in a blanket, and listening to a pod cast in her bed.     P: Continue to attend PHP OT group sessions 5x week for 2 weeks to promote daily structure, social engagement, and opportunities to develop and utilize adaptive strategies to maximize functional performance in preparation for safe transition and integration back into school, work, and the community. Plan to address topic of time management in next OT group session.    Plan - 04/21/20 1212    Occupational performance deficits (Please refer to evaluation for details): ADL's;IADL's;Rest and Sleep;Education;Work;Leisure;Social Participation    Body Structure / Function / Physical Skills ADL    Cognitive Skills Attention;Consciousness;Emotional;Energy/Drive;Memory;Perception;Problem Solve;Safety Awareness;Temperament/Personality;Thought;Understand    Psychosocial Skills Coping Strategies;Environmental  Adaptations;Interpersonal Interaction;Routines and Behaviors;Habits           Patient will benefit from skilled therapeutic intervention in order to improve the following deficits and impairments:   Body Structure / Function /  Physical Skills: ADL Cognitive Skills: Attention, Consciousness, Emotional, Energy/Drive, Memory, Perception, Problem Solve, Safety Awareness, Temperament/Personality, Thought, Understand Psychosocial Skills: Coping Strategies, Environmental  Adaptations, Interpersonal Interaction, Routines and Behaviors, Habits   Visit Diagnosis: Difficulty coping  Frontal lobe and executive function deficit  Bipolar II disorder [facility])    Problem List Patient Active Problem List   Diagnosis Date Noted  . Tobacco abuse 04/06/2020  . Hyperlipidemia 04/06/2020  . CAD (coronary artery disease) 04/06/2020  . STEMI (ST elevation myocardial infarction) (HCC) 04/05/2020  . Depressive disorder 12/06/2016  . Type 1 diabetes mellitus (HCC) 11/04/2016    04/21/2020  Alicia Phillips, MOT, OTR/L  04/21/2020, 12:13 PM  [facility] PARTIAL HOSPITALIZATION PROGRAM [address] [place], [place], [address] Phone: [phone]   Fax:  [phone]  Name: Alicia Phillips MRN: [id number] Date of Birth: 08-Jul-1981

## 2020-04-21 NOTE — Progress Notes (Signed)
Spoke with patient via Webex video call, used 2 identifiers to correctly identify patient. She states this is her first time in PHP at the recommendation of her PCP. She had a heart attack on 04/04/20 and this causing her severe anxiety that she is going to die. Has bipolar and borderline personality disorder. Has anxiety attacks daily and mostly at night since her heart attack happened at night while laying in bed. Was started on hydroxyzine last week. No side effects noted. On scale 1-10 as 10 being worst she rates depression at 7 and anxiety at 10. Denies SI/HI or AV hallucinations. PHQ9=16. Groups are going well so far. No issues or complaints.

## 2020-04-24 ENCOUNTER — Encounter (HOSPITAL_COMMUNITY): Payer: Self-pay | Admitting: Family

## 2020-04-24 ENCOUNTER — Other Ambulatory Visit (HOSPITAL_COMMUNITY): Payer: BC Managed Care – PPO

## 2020-04-24 ENCOUNTER — Other Ambulatory Visit: Payer: Self-pay

## 2020-04-24 ENCOUNTER — Other Ambulatory Visit (HOSPITAL_COMMUNITY): Payer: BC Managed Care – PPO | Admitting: Licensed Clinical Social Worker

## 2020-04-24 ENCOUNTER — Other Ambulatory Visit: Payer: Self-pay | Admitting: Adult Health

## 2020-04-24 DIAGNOSIS — F331 Major depressive disorder, recurrent, moderate: Secondary | ICD-10-CM

## 2020-04-24 DIAGNOSIS — E785 Hyperlipidemia, unspecified: Secondary | ICD-10-CM | POA: Diagnosis not present

## 2020-04-24 DIAGNOSIS — Z794 Long term (current) use of insulin: Secondary | ICD-10-CM | POA: Diagnosis not present

## 2020-04-24 DIAGNOSIS — F411 Generalized anxiety disorder: Secondary | ICD-10-CM

## 2020-04-24 DIAGNOSIS — F3181 Bipolar II disorder: Secondary | ICD-10-CM

## 2020-04-24 DIAGNOSIS — Z882 Allergy status to sulfonamides status: Secondary | ICD-10-CM | POA: Diagnosis not present

## 2020-04-24 DIAGNOSIS — I252 Old myocardial infarction: Secondary | ICD-10-CM | POA: Diagnosis not present

## 2020-04-24 DIAGNOSIS — Z7982 Long term (current) use of aspirin: Secondary | ICD-10-CM | POA: Diagnosis not present

## 2020-04-24 DIAGNOSIS — F332 Major depressive disorder, recurrent severe without psychotic features: Secondary | ICD-10-CM | POA: Diagnosis not present

## 2020-04-24 DIAGNOSIS — Z7901 Long term (current) use of anticoagulants: Secondary | ICD-10-CM | POA: Diagnosis not present

## 2020-04-24 DIAGNOSIS — Z79899 Other long term (current) drug therapy: Secondary | ICD-10-CM | POA: Diagnosis not present

## 2020-04-24 DIAGNOSIS — F17200 Nicotine dependence, unspecified, uncomplicated: Secondary | ICD-10-CM | POA: Diagnosis not present

## 2020-04-24 DIAGNOSIS — E119 Type 2 diabetes mellitus without complications: Secondary | ICD-10-CM | POA: Diagnosis not present

## 2020-04-24 NOTE — Progress Notes (Signed)
BH MD/PA/NP OP Progress Note  04/24/2020 6:01 PM Alicia Phillips  MRN:  [id number]  Evaluation: [name] was evaluated via WebEx.  She is awake, alert and oriented x3.  Presents tearful, flat but pleasant.  Reports " I am a very emotional person."  Alicia Phillips denies any current stressors that is affecting her mood states that she woke up feeling very anxious this morning. Stated she recently took a Vistaril which she is feeling some relief.  Patient was initiated on Abilify 5 mg dsily she reports taking and tolerating medications well.  She denied any medication side effects.  She denied suicidal or homicidal ideations.  Denies auditory or visual hallucinations.  [name] reports ongoing ruminations and mild paranoia with anxiety/panic attacks as she equates them to heart attack due to her history.  [name] reports using coping skills to help her through most of her symptoms.  She reports a good appetite.  States she is rested well throughout the night.  Patient to continue partial hospitalization programming. Support,encouragement and  reassurance was provided   Visit Diagnosis: No diagnosis found.  Past Psychiatric History:  Past Medical History:  Past Medical History:  Diagnosis Date  . Anxiety   . Bipolar disorder (HCC)   . CAD (coronary artery disease)    a. LHC 04/05/20: 95% stenosis of mid LAD s/p DES, 30% stenosis of proximal to mid LAD  . Diabetes mellitus type 1 (HCC)   . History of borderline personality disorder   . Hyperlipidemia   . Major depression   . STEMI (ST elevation myocardial infarction) (HCC) 04/05/2020  . Tobacco use     Past Surgical History:  Procedure Laterality Date  . CORONARY STENT INTERVENTION N/A 04/05/2020   Procedure: CORONARY STENT INTERVENTION;  Surgeon: Iran Ouch, MD;  [facility];  Service: Cardiovascular;  Laterality: N/A;  . LEFT HEART CATH AND CORONARY ANGIOGRAPHY N/A 04/05/2020   Procedure: LEFT HEART CATH AND CORONARY ANGIOGRAPHY;   Surgeon: Iran Ouch, MD;  [facility]: [facility];  Service: Cardiovascular;  Laterality: N/A;    Family Psychiatric History:   Family History:  Family History  Problem Relation Age of Onset  . Depression Father   . Breast cancer Maternal Grandmother   . Depression Brother   . Anxiety disorder Brother   . ADD / ADHD Brother   . Alcohol abuse Brother   . Alcohol abuse Maternal Grandfather     Social History:  Social History   Socioeconomic History  . Marital status: Single    Spouse name: Not on file  . Number of children: Not on file  . Years of education: Not on file  . Highest education level: Not on file  Occupational History  . Not on file  Tobacco Use  . Smoking status: Former Games developer  . Smokeless tobacco: Never Used  . Tobacco comment: quit after  heartattack Sept 2021  Vaping Use  . Vaping Use: Never used  Substance and Sexual Activity  . Alcohol use: Never  . Drug use: Never  . Sexual activity: Not on file  Other Topics Concern  . Not on file  Social History Narrative  . Not on file   Social Determinants of Health   Financial Resource Strain:   . Difficulty of Paying Living Expenses: Not on file  Food Insecurity:   . Worried About Programme researcher, broadcasting/film/video in the Last Year: Not on file  . Ran Out of Food in the Last Year: Not  on file  Transportation Needs:   . Lack of Transportation (Medical): Not on file  . Lack of Transportation (Non-Medical): Not on file  Physical Activity:   . Days of Exercise per Week: Not on file  . Minutes of Exercise per Session: Not on file  Stress:   . Feeling of Stress : Not on file  Social Connections:   . Frequency of Communication with Friends and Family: Not on file  . Frequency of Social Gatherings with Friends and Family: Not on file  . Attends Religious Services: Not on file  . Active Member of Clubs or Organizations: Not on file  . Attends Banker Meetings: Not on file  . Marital Status: Not  on file    Allergies:  Allergies  Allergen Reactions  . Sulfamethoxazole-Trimethoprim Rash    Metabolic Disorder Labs: Lab Results  Component Value Date   HGBA1C 8.6 (H) 04/06/2020   MPG 200.12 04/06/2020   No results found for: PROLACTIN Lab Results  Component Value Date   CHOL 193 04/06/2020   TRIG 96 04/06/2020   HDL 50 04/06/2020   CHOLHDL 3.9 04/06/2020   VLDL 19 04/06/2020   LDLCALC 124 (H) 04/06/2020   No results found for: TSH  Therapeutic Level Labs: No results found for: LITHIUM No results found for: VALPROATE No components found for:  CBMZ  Current Medications: Current Outpatient Medications  Medication Sig Dispense Refill  . ACETAMINOPHEN EXTRA STRENGTH 500 MG tablet Take 500 mg by mouth every 4 (four) hours as needed for mild pain or headache.     . ARIPiprazole (ABILIFY) 5 MG tablet Take 1 tablet (5 mg total) by mouth daily. 30 tablet 0  . aspirin 81 MG chewable tablet Chew 1 tablet (81 mg total) by mouth daily.    Marland Kitchen atorvastatin (LIPITOR) 80 MG tablet Take 1 tablet (80 mg total) by mouth daily. 30 tablet 2  . hydrOXYzine (ATARAX/VISTARIL) 10 MG tablet Take 10 mg by mouth every 8 (eight) hours as needed for anxiety.    . insulin aspart (NOVOLOG) 100 UNIT/ML injection Inject 0-20 Units into the skin 3 (three) times daily before meals. Per sliding scale    . lamoTRIgine (LAMICTAL) 100 MG tablet Take one tablet at bedtime. (Patient taking differently: Take 100 mg by mouth at bedtime. Take one tablet at bedtime.) 30 tablet 2  . metoprolol succinate (TOPROL-XL) 25 MG 24 hr tablet Take 1 tablet (25 mg total) by mouth daily. Take with or immediately following a meal. 90 tablet 3  . nitroGLYCERIN (NITROSTAT) 0.4 MG SL tablet Place 1 tablet (0.4 mg total) under the tongue every 5 (five) minutes as needed for chest pain. 25 tablet 2  . ticagrelor (BRILINTA) 90 MG TABS tablet Take 1 tablet (90 mg total) by mouth 2 (two) times daily. 60 tablet 11  . TRESIBA FLEXTOUCH  100 UNIT/ML FlexTouch Pen Inject 25 Units into the skin daily.     Marland Kitchen doxycycline (VIBRAMYCIN) 50 MG capsule Take 50 mg by mouth daily. (Patient not taking: Reported on 04/21/2020)     Current Facility-Administered Medications  Medication Dose Route Frequency Provider Last Rate Last Admin  . iron polysaccharides (NIFEREX) capsule 150 mg  150 mg Oral Daily [place], [place], [place]         Musculoskeletal:  Psychiatric Specialty Exam: Review of Systems  Last menstrual period 04/04/2020.There is no height or weight on file to calculate BMI.  General Appearance: Casual  Eye Contact:  Good  Speech:  Clear and Coherent  Volume:  Normal  Mood:  Anxious and Depressed  Affect:  Congruent  Thought Process:  Coherent  Orientation:  Full (Time, Place, and Person)  Thought Content: Logical   Suicidal Thoughts:  No  Homicidal Thoughts:  No  Memory:  Immediate;   Fair Recent;   Fair  Judgement:  Fair  Insight:  Fair  Psychomotor Activity:  Normal  Concentration:  Concentration: Fair  Recall:  [employer] of Knowledge: Fair  Language: Fair  Akathisia:  No  Handed:  Right  AIMS (if indicated):   Assets:  Communication Skills Desire for Improvement Social Support  ADL's:  Intact  Cognition: WNL  Sleep:  Fair   Screenings: PHQ2-9     Counselor from 04/21/2020 in [facility]  PHQ-2 Total Score 5  PHQ-9 Total Score 16      Assessment and Plan:  Continue partial hospitalization programming (PHP) Continue medications for mood stablization as prescribed  -Continue Abilify 5 mgs daily -Continue Lamictal 100 mg p.o. nightly -Continue hydroxyzine 10 mg p.o. as needed   Treatment plan was reviewed and agreed upon by NPT Linzie Boursiquot and patient Kamil Delsanto's need for continued group services   [name], NP 04/24/2020, 6:01 PM

## 2020-04-24 NOTE — Progress Notes (Signed)
Spoke with patient via Webex video call, used 2 identifiers to correctly identify patient. States she has been crying a little this morning, feels sadness but otherwise feels pretty good. "I'm not overwhelmed, this is normal for me." Still has anxiety over the heartattack and also about her smoking cessation efforts. Took a Vistaril about 1 hour ago and feels it is helping. Groups are going well and she is enjoying them. On scale 1-10 as 10 being worst she rates depression at 7/8 and anxiety at 4 since taking Vistaril. Denies SI/HI and AV hallucinations.

## 2020-04-25 ENCOUNTER — Other Ambulatory Visit (HOSPITAL_COMMUNITY): Payer: BC Managed Care – PPO | Admitting: Specialist

## 2020-04-25 ENCOUNTER — Encounter (HOSPITAL_COMMUNITY): Payer: Self-pay

## 2020-04-25 ENCOUNTER — Other Ambulatory Visit (HOSPITAL_COMMUNITY): Payer: BC Managed Care – PPO | Admitting: Licensed Clinical Social Worker

## 2020-04-25 ENCOUNTER — Other Ambulatory Visit: Payer: Self-pay

## 2020-04-25 DIAGNOSIS — R4589 Other symptoms and signs involving emotional state: Secondary | ICD-10-CM

## 2020-04-25 DIAGNOSIS — F411 Generalized anxiety disorder: Secondary | ICD-10-CM

## 2020-04-25 DIAGNOSIS — F17200 Nicotine dependence, unspecified, uncomplicated: Secondary | ICD-10-CM | POA: Diagnosis not present

## 2020-04-25 DIAGNOSIS — Z79899 Other long term (current) drug therapy: Secondary | ICD-10-CM | POA: Diagnosis not present

## 2020-04-25 DIAGNOSIS — I252 Old myocardial infarction: Secondary | ICD-10-CM | POA: Diagnosis not present

## 2020-04-25 DIAGNOSIS — F332 Major depressive disorder, recurrent severe without psychotic features: Secondary | ICD-10-CM | POA: Diagnosis not present

## 2020-04-25 DIAGNOSIS — Z7901 Long term (current) use of anticoagulants: Secondary | ICD-10-CM | POA: Diagnosis not present

## 2020-04-25 DIAGNOSIS — Z882 Allergy status to sulfonamides status: Secondary | ICD-10-CM | POA: Diagnosis not present

## 2020-04-25 DIAGNOSIS — Z794 Long term (current) use of insulin: Secondary | ICD-10-CM | POA: Diagnosis not present

## 2020-04-25 DIAGNOSIS — E785 Hyperlipidemia, unspecified: Secondary | ICD-10-CM | POA: Diagnosis not present

## 2020-04-25 DIAGNOSIS — F3181 Bipolar II disorder: Secondary | ICD-10-CM

## 2020-04-25 DIAGNOSIS — Z7982 Long term (current) use of aspirin: Secondary | ICD-10-CM | POA: Diagnosis not present

## 2020-04-25 DIAGNOSIS — E119 Type 2 diabetes mellitus without complications: Secondary | ICD-10-CM | POA: Diagnosis not present

## 2020-04-25 NOTE — Therapy (Signed)
Virtual Visit via Video Note  I connected with Alicia Phillips on 04/25/20 at  1100 AM EDT by a video enabled telemedicine application and verified that I am speaking with the correct person using two identifiers.   I discussed the limitations of evaluation and management by telemedicine and the availability of in person appointments. The patient expressed understanding and agreed to proceed.     I discussed the assessment and treatment plan with the patient. The patient was provided an opportunity to ask questions and all were answered. The patient agreed with the plan and demonstrated an understanding of the instructions.   The patient was advised to call back or seek an in-person evaluation if the symptoms worsen or if the condition fails to improve as anticipated.  I provided 40 minutes of non-face-to-face time during this encounter.   Shirlean Mylar, MHA, OTR/L [phone]   [facility] HOSPITALIZATION PROGRAM [address] [place], [place], [address] Phone: [phone]   Fax:  [phone]  Occupational Therapy Treatment  Patient Details  Name: Alicia Phillips MRN: [id number] Date of Birth: [birth year] Referring Provider (OT): Hillery Jacks   Encounter Date: 04/25/2020   OT End of Session - 04/25/20 1338    Visit Number 4    Number of Visits 20    Date for OT Re-Evaluation 05/17/20    Authorization Type BCBS    Authorization Time Period no auth or visit limit    OT Start Time 1100    OT Stop Time 1140    OT Time Calculation (min) 40 min    Activity Tolerance Patient tolerated treatment well    Behavior During Therapy WFL for tasks assessed/performed           Past Medical History:  Diagnosis Date  . Anxiety   . Bipolar disorder (HCC)   . CAD (coronary artery disease)    a. LHC 04/05/20: 95% stenosis of mid LAD s/p DES, 30% stenosis of proximal to mid LAD  . Diabetes mellitus type 1 (HCC)   . History of borderline  personality disorder   . Hyperlipidemia   . Major depression   . STEMI (ST elevation myocardial infarction) (HCC) 04/05/2020  . Tobacco use     Past Surgical History:  Procedure Laterality Date  . CORONARY STENT INTERVENTION N/A 04/05/2020   Procedure: CORONARY STENT INTERVENTION;  Surgeon: Iran Ouch, MD;  [facility];  Service: Cardiovascular;  Laterality: N/A;  . LEFT HEART CATH AND CORONARY ANGIOGRAPHY N/A 04/05/2020   Procedure: LEFT HEART CATH AND CORONARY ANGIOGRAPHY;  Surgeon: Iran Ouch, MD;  [facility];  Service: Cardiovascular;  Laterality: N/A;    There were no vitals filed for this visit.   Subjective Assessment - 04/25/20 1337    Currently in Pain? No/denies    Pain Score 0-No pain                 S:  My anxiety makes it difficult for me to go to sleep. O: patient participated in skilled OT group focusing on improving sleep hygiene.  Patient was educated and discussed benefits of getting enough sleep, detriments of getting too much or too little sleep.  Patient and group discussed strategies for improving sleep including routines, environmental factors, diet, exercises, calming activities.  Patient was educated on use of sleep diary to identify current sleep patterns and strategies to improve sleep routine.   A:  Alicia Phillips was engaged  throughout group session.  She would like to be able to fall asleep easily and stay asleep.  Discussed strategies of dimming or using night mode on phone/tablet when preparing for bed.  Alicia Phillips committed to keeping a notebook by her bed to right down issues that are causing her to not sleep and address the next morning.  P:  continue skilled OT group 2 times per week for 3 weeks.                OT Education - 04/25/20 1337    Education Details educated on sleep hygiene strategies    Person(s) Educated Patient    Methods Explanation;Handout    Comprehension Verbalized understanding             OT Short Term Goals - 04/20/20 1554      OT SHORT TERM GOAL #1   Status On-going      OT SHORT TERM GOAL #2   Status On-going      OT SHORT TERM GOAL #3   Status On-going      OT SHORT TERM GOAL #4   Status On-going                    Plan - 04/25/20 1338    Body Structure / Function / Physical Skills ADL    Cognitive Skills Attention;Consciousness;Emotional;Energy/Drive;Memory;Perception;Problem Solve;Safety Awareness;Temperament/Personality;Thought;Understand    Psychosocial Skills Coping Strategies;Environmental  Adaptations;Interpersonal Interaction;Routines and Behaviors;Habits           Patient will benefit from skilled therapeutic intervention in order to improve the following deficits and impairments:   Body Structure / Function / Physical Skills: ADL Cognitive Skills: Attention, Consciousness, Emotional, Energy/Drive, Memory, Perception, Problem Solve, Safety Awareness, Temperament/Personality, Thought, Understand Psychosocial Skills: Coping Strategies, Environmental  Adaptations, Interpersonal Interaction, Routines and Behaviors, Habits   Visit Diagnosis: Difficulty coping    Problem List Patient Active Problem List   Diagnosis Date Noted  . Tobacco abuse 04/06/2020  . Hyperlipidemia 04/06/2020  . CAD (coronary artery disease) 04/06/2020  . STEMI (ST elevation myocardial infarction) (HCC) 04/05/2020  . Depressive disorder 12/06/2016  . Type 1 diabetes mellitus (HCC) 11/04/2016    Shirlean Mylar, MHA, OTR/L [phone]  04/25/2020, 1:39 PM  [facility] PARTIAL HOSPITALIZATION PROGRAM [address] [place], [place], [address] Phone: [phone]   Fax:  [phone]  Name: Alicia Phillips MRN: [id number] Date of Birth: [birth year]

## 2020-04-26 ENCOUNTER — Encounter (HOSPITAL_COMMUNITY): Payer: Self-pay

## 2020-04-26 ENCOUNTER — Other Ambulatory Visit (HOSPITAL_COMMUNITY): Payer: BC Managed Care – PPO | Admitting: Occupational Therapy

## 2020-04-26 ENCOUNTER — Other Ambulatory Visit (HOSPITAL_COMMUNITY): Payer: BC Managed Care – PPO | Admitting: Licensed Clinical Social Worker

## 2020-04-26 ENCOUNTER — Other Ambulatory Visit: Payer: Self-pay

## 2020-04-26 DIAGNOSIS — F411 Generalized anxiety disorder: Secondary | ICD-10-CM

## 2020-04-26 DIAGNOSIS — F3181 Bipolar II disorder: Secondary | ICD-10-CM

## 2020-04-26 DIAGNOSIS — Z7901 Long term (current) use of anticoagulants: Secondary | ICD-10-CM | POA: Diagnosis not present

## 2020-04-26 DIAGNOSIS — Z7982 Long term (current) use of aspirin: Secondary | ICD-10-CM | POA: Diagnosis not present

## 2020-04-26 DIAGNOSIS — R41844 Frontal lobe and executive function deficit: Secondary | ICD-10-CM

## 2020-04-26 DIAGNOSIS — Z79899 Other long term (current) drug therapy: Secondary | ICD-10-CM | POA: Diagnosis not present

## 2020-04-26 DIAGNOSIS — F332 Major depressive disorder, recurrent severe without psychotic features: Secondary | ICD-10-CM | POA: Diagnosis not present

## 2020-04-26 DIAGNOSIS — Z882 Allergy status to sulfonamides status: Secondary | ICD-10-CM | POA: Diagnosis not present

## 2020-04-26 DIAGNOSIS — E119 Type 2 diabetes mellitus without complications: Secondary | ICD-10-CM | POA: Diagnosis not present

## 2020-04-26 DIAGNOSIS — F17200 Nicotine dependence, unspecified, uncomplicated: Secondary | ICD-10-CM | POA: Diagnosis not present

## 2020-04-26 DIAGNOSIS — E785 Hyperlipidemia, unspecified: Secondary | ICD-10-CM | POA: Diagnosis not present

## 2020-04-26 DIAGNOSIS — Z794 Long term (current) use of insulin: Secondary | ICD-10-CM | POA: Diagnosis not present

## 2020-04-26 DIAGNOSIS — I252 Old myocardial infarction: Secondary | ICD-10-CM | POA: Diagnosis not present

## 2020-04-26 DIAGNOSIS — R4589 Other symptoms and signs involving emotional state: Secondary | ICD-10-CM

## 2020-04-26 NOTE — Progress Notes (Signed)
Community note   Pt attended spiritual care group     11:00-12:00.  Group met via web-ex due to COVID-19 precautions.  Group facilitated by Simone Curia, MDiv, [place]   Group focused on topic of "community."  Members reflected on topic in facilitated dialog, identifying responses to topic and notions they hold of community from their previous experience.  Group members utilized value sort cards to identify top qualities they look for in community.  Engaged in facilitated dialog around their value choices, noting origin of these values, how these are realized in their lives, and strategies for engaging these values.    Spiritual care group drew on Motivational Interviewing, Narrative and Adlerian modalities

## 2020-04-26 NOTE — Telephone Encounter (Signed)
Attempted to call patient in regards to Cardiac Rehab - unable to leave VM 

## 2020-04-26 NOTE — Therapy (Signed)
[facility] PARTIAL HOSPITALIZATION PROGRAM [address] [place], [place], [address] Phone: [phone]   Fax:  [phone] Virtual Visit via Video Note  I connected with Alicia Phillips on 04/26/20 at  12:00 PM EDT by a video enabled telemedicine application and verified that I am speaking with the correct person using two identifiers.   I discussed the limitations of evaluation and management by telemedicine and the availability of in person appointments. The patient expressed understanding and agreed to proceed.    I discussed the assessment and treatment plan with the patient. The patient was provided an opportunity to ask questions and all were answered. The patient agreed with the plan and demonstrated an understanding of the instructions.   The patient was advised to call back or seek an in-person evaluation if the symptoms worsen or if the condition fails to improve as anticipated.  Location: Patient - Patient Home OT - Clinic Office   I provided 55 minutes of non-face-to-face time during this encounter.   Donne Hazel, OT   Occupational Therapy Treatment  Patient Details  Name: Alicia Phillips MRN: [id number] Date of Birth: 1981-03-29 Referring Provider (OT): Hillery Jacks   Encounter Date: 04/26/2020   OT End of Session - 04/26/20 1513    Visit Number 5    Number of Visits 20    Date for OT Re-Evaluation 05/17/20    Authorization Type BCBS    Authorization Time Period no auth or visit limit    OT Start Time 1200    OT Stop Time 1255    OT Time Calculation (min) 55 min    Activity Tolerance Patient tolerated treatment well    Behavior During Therapy WFL for tasks assessed/performed           Past Medical History:  Diagnosis Date  . Anxiety   . Bipolar disorder (HCC)   . CAD (coronary artery disease)    a. LHC 04/05/20: 95% stenosis of mid LAD s/p DES, 30% stenosis of proximal to mid LAD  . Diabetes mellitus type 1 (HCC)   .  History of borderline personality disorder   . Hyperlipidemia   . Major depression   . STEMI (ST elevation myocardial infarction) (HCC) 04/05/2020  . Tobacco use     Past Surgical History:  Procedure Laterality Date  . CORONARY STENT INTERVENTION N/A 04/05/2020   Procedure: CORONARY STENT INTERVENTION;  Surgeon: Iran Ouch, MD;  [facility]: [facility];  Service: Cardiovascular;  Laterality: N/A;  . LEFT HEART CATH AND CORONARY ANGIOGRAPHY N/A 04/05/2020   Procedure: LEFT HEART CATH AND CORONARY ANGIOGRAPHY;  Surgeon: Iran Ouch, MD;  [facility];  Service: Cardiovascular;  Laterality: N/A;    There were no vitals filed for this visit.   Subjective Assessment - 04/26/20 1513    Currently in Pain? No/denies    Pain Score 0-No pain             OT Education - 04/26/20 1513    Education Details Educated on different communication styles and identified strategies/tips to practice being more assertive    Person(s) Educated Patient    Methods Explanation;Handout    Comprehension Verbalized understanding            OT Short Term Goals - 04/20/20 1554      OT SHORT TERM GOAL #1   Status On-going      OT SHORT TERM GOAL #2   Status On-going  OT SHORT TERM GOAL #3   Status On-going      OT SHORT TERM GOAL #4   Status On-going         Group Session:  S: "I tend to be more passive, but when the borderline comes out, I get the aggressive side of me."  O: Group began with a reflection from previous OT session on stress management. Today's group focused on topic of Communication Styles. Group members were educated on the different styles including passive, aggressive, and assertive communication. Members shared and reflected on which style they most often find themselves communicating in and how to transition to a more assertive approach.   A: Alicia Phillips was active in her participation of activity and discussion, asking several clarifying  questions throughout. She shared that she was looking to find specific strategies to be more assertive and spoke about difficulty advocating for herself. She shared that she recently attended a medical appointment and wanted follow up, however did not speak up for this need, and ultimately did not get the care/treatment that she was hoping for. Pt was offered some further education and strategies surrounding being assertive, and spoke about learning how to "say NO". She identified often falling into the category of passive communication, though noted "my borderline brings out the aggression in me."  P: Continue to attend PHP OT group sessions 5x week for 2 weeks to promote daily structure, social engagement, and opportunities to develop and utilize adaptive strategies to maximize functional performance in preparation for safe transition and integration back into school, work, and the community. Plan to address topic of assertiveness training in next OT group session.    Plan - 04/26/20 1513    Occupational performance deficits (Please refer to evaluation for details): ADL's;IADL's;Rest and Sleep;Education;Work;Leisure;Social Participation    Body Structure / Function / Physical Skills ADL    Cognitive Skills Attention;Consciousness;Emotional;Energy/Drive;Memory;Perception;Problem Solve;Safety Awareness;Temperament/Personality;Thought;Understand    Psychosocial Skills Coping Strategies;Environmental  Adaptations;Interpersonal Interaction;Routines and Behaviors;Habits           Patient will benefit from skilled therapeutic intervention in order to improve the following deficits and impairments:   Body Structure / Function / Physical Skills: ADL Cognitive Skills: Attention, Consciousness, Emotional, Energy/Drive, Memory, Perception, Problem Solve, Safety Awareness, Temperament/Personality, Thought, Understand Psychosocial Skills: Coping Strategies, Environmental  Adaptations, Interpersonal  Interaction, Routines and Behaviors, Habits   Visit Diagnosis: Difficulty coping  Frontal lobe and executive function deficit  Bipolar II disorder [facility])    Problem List Patient Active Problem List   Diagnosis Date Noted  . Tobacco abuse 04/06/2020  . Hyperlipidemia 04/06/2020  . CAD (coronary artery disease) 04/06/2020  . STEMI (ST elevation myocardial infarction) (HCC) 04/05/2020  . Depressive disorder 12/06/2016  . Type 1 diabetes mellitus (HCC) 11/04/2016    04/26/2020  Donne Hazel, MOT, OTR/L  04/26/2020, 3:14 PM  [facility] PARTIAL HOSPITALIZATION PROGRAM [address] [place], [place], [address] Phone: [phone]   Fax:  [phone]  Name: Alicia Phillips MRN: [id number] Date of Birth: [birth year]

## 2020-04-27 ENCOUNTER — Other Ambulatory Visit (HOSPITAL_COMMUNITY): Payer: BC Managed Care – PPO | Admitting: Occupational Therapy

## 2020-04-27 ENCOUNTER — Encounter (HOSPITAL_COMMUNITY): Payer: Self-pay

## 2020-04-27 ENCOUNTER — Other Ambulatory Visit: Payer: Self-pay

## 2020-04-27 ENCOUNTER — Telehealth: Payer: Self-pay | Admitting: Cardiovascular Disease

## 2020-04-27 ENCOUNTER — Other Ambulatory Visit (HOSPITAL_COMMUNITY): Payer: BC Managed Care – PPO | Admitting: Licensed Clinical Social Worker

## 2020-04-27 DIAGNOSIS — Z794 Long term (current) use of insulin: Secondary | ICD-10-CM | POA: Diagnosis not present

## 2020-04-27 DIAGNOSIS — F411 Generalized anxiety disorder: Secondary | ICD-10-CM

## 2020-04-27 DIAGNOSIS — E785 Hyperlipidemia, unspecified: Secondary | ICD-10-CM | POA: Diagnosis not present

## 2020-04-27 DIAGNOSIS — F3181 Bipolar II disorder: Secondary | ICD-10-CM

## 2020-04-27 DIAGNOSIS — R4589 Other symptoms and signs involving emotional state: Secondary | ICD-10-CM

## 2020-04-27 DIAGNOSIS — Z882 Allergy status to sulfonamides status: Secondary | ICD-10-CM | POA: Diagnosis not present

## 2020-04-27 DIAGNOSIS — I252 Old myocardial infarction: Secondary | ICD-10-CM | POA: Diagnosis not present

## 2020-04-27 DIAGNOSIS — Z7982 Long term (current) use of aspirin: Secondary | ICD-10-CM | POA: Diagnosis not present

## 2020-04-27 DIAGNOSIS — R41844 Frontal lobe and executive function deficit: Secondary | ICD-10-CM

## 2020-04-27 DIAGNOSIS — F17200 Nicotine dependence, unspecified, uncomplicated: Secondary | ICD-10-CM | POA: Diagnosis not present

## 2020-04-27 DIAGNOSIS — Z7901 Long term (current) use of anticoagulants: Secondary | ICD-10-CM | POA: Diagnosis not present

## 2020-04-27 DIAGNOSIS — Z79899 Other long term (current) drug therapy: Secondary | ICD-10-CM | POA: Diagnosis not present

## 2020-04-27 DIAGNOSIS — F332 Major depressive disorder, recurrent severe without psychotic features: Secondary | ICD-10-CM | POA: Diagnosis not present

## 2020-04-27 DIAGNOSIS — E119 Type 2 diabetes mellitus without complications: Secondary | ICD-10-CM | POA: Diagnosis not present

## 2020-04-27 NOTE — Telephone Encounter (Signed)
Pt called and wanted to know if Dr. Allyson Sabal would be willing to put in another referral request to cardiac rehab. Please call to discuss

## 2020-04-27 NOTE — Telephone Encounter (Signed)
Patient has been trying to get in contact with cardiac rehab to schedule appointment. She states that she got in contact with them and for some reason it looks like referral was closed out. She is asking for another referral to be put in so that she can begin rehab.

## 2020-04-27 NOTE — Therapy (Signed)
[facility] PARTIAL HOSPITALIZATION PROGRAM [address] [place], [place], [address] Phone: [phone]   Fax:  [phone] Virtual Visit via Video Note  I connected with Alicia Phillips on 04/27/20 at  11:00 AM EDT by a video enabled telemedicine application and verified that I am speaking with the correct person using two identifiers.   I discussed the limitations of evaluation and management by telemedicine and the availability of in person appointments. The patient expressed understanding and agreed to proceed.   I discussed the assessment and treatment plan with the patient. The patient was provided an opportunity to ask questions and all were answered. The patient agreed with the plan and demonstrated an understanding of the instructions.   The patient was advised to call back or seek an in-person evaluation if the symptoms worsen or if the condition fails to improve as anticipated.  Location: Patient - Patient Home OT - Clinic Office   I provided 70 minutes of non-face-to-face time during this encounter.   Donne Hazel, OT   Occupational Therapy Treatment  Patient Details  Name: Alicia Phillips MRN: [id number] Date of Birth: 05-18-1981 Referring Provider (OT): Hillery Jacks   Encounter Date: 04/27/2020   OT End of Session - 04/27/20 1217    Visit Number 6    Number of Visits 20    Date for OT Re-Evaluation 05/17/20    Authorization Type BCBS    Authorization Time Period no auth or visit limit    OT Start Time 1100    OT Stop Time 1210    OT Time Calculation (min) 70 min    Activity Tolerance Patient tolerated treatment well    Behavior During Therapy WFL for tasks assessed/performed           Past Medical History:  Diagnosis Date  . Anxiety   . Bipolar disorder (HCC)   . CAD (coronary artery disease)    a. LHC 04/05/20: 95% stenosis of mid LAD s/p DES, 30% stenosis of proximal to mid LAD  . Diabetes mellitus type 1 (HCC)   .  History of borderline personality disorder   . Hyperlipidemia   . Major depression   . STEMI (ST elevation myocardial infarction) (HCC) 04/05/2020  . Tobacco use     Past Surgical History:  Procedure Laterality Date  . CORONARY STENT INTERVENTION N/A 04/05/2020   Procedure: CORONARY STENT INTERVENTION;  Surgeon: Iran Ouch, MD;  [facility] IN[facility]AS[facility] LAB;  Service: Cardiovascular;  Laterality: N/A;  . LEFT HEART CATH AND CORONARY ANGIOGRAPHY N/A 04/05/2020   Procedure: LEFT HEART CATH AND CORONARY ANGIOGRAPHY;  Surgeon: Iran Ouch, MD;  [facility]: [facility] INVAS[facility] CV LAB;  Service: Cardiovascular;  Laterality: N/A;    There were no vitals filed for this visit.   Subjective Assessment - 04/27/20 1216    Currently in Pain? No/denies    Pain Score 0-No pain                                OT Education - 04/27/20 1216    Education Details Educated on different communication styles with strategies to become more assertive with use of XYZ communication tool    Person(s) Educated Patient    Methods Explanation;Handout    Comprehension Verbalized understanding            OT Short Term Goals - 04/20/20 1554      OT SHORT  TERM GOAL #1   Status On-going      OT SHORT TERM GOAL #2   Status On-going      OT SHORT TERM GOAL #3   Status On-going      OT SHORT TERM GOAL #4   Status On-going         Group Session:  S: "I find it difficult to navigate this interpersonal feeling of needing to please people, especially with work obligations."  O: Group began with a reflection from previous OT session focused on communication styles and group members re-iterated what was learned during previous session. Members shared and reflected on any opportunities they were presented with last evening to practice their assertiveness skills or recognize patterns of communication observed. Today's group focused on assertiveness skills training and use of the XYZ*  assertive communication tool was introduced. The XYZ communication tool states: I feel X when you do Y in situation Z and I would like _________. X is the emotion, Y is the specific behavior, and Z is the specific situation. Group members each formulated their own XYZ statement and shared with the group to discuss and offer feedback. Additional tips and strategies to practice being assertive were also introduced and discussed.   A: Alicia Phillips was active and independent in her participation of discussion and activity, sharing that she often has difficulty saying 'No' and has an interpersonal feeling that she "needs to please people", especially as it relates to her work environment and perceived performance. She shared that having a specific tool or formula like the XYZ statement would be helpful in grounding herself and how she is feeling, without the guilt of asking for what she needs. Pt identified intent to use this tool moving forward to practice her assertive skills, along with identifying a list of work boundaries she would like to have in place and where she is and is not flexible in adjusting (coming in early vs being compensated, etc.)   P: Continue to attend PHP OT group sessions 5x week for 2 weeks to promote daily structure, social engagement, and opportunities to develop and utilize adaptive strategies to maximize functional performance in preparation for safe transition and integration back into school, work, and the community. Plan to address topic of Fair Fighting in next OT group session.    Plan - 04/27/20 1217    Occupational performance deficits (Please refer to evaluation for details): ADL's;IADL's;Rest and Sleep;Education;Work;Leisure;Social Participation    Body Structure / Function / Physical Skills ADL    Cognitive Skills Attention;Consciousness;Emotional;Energy/Drive;Memory;Perception;Problem Solve;Safety Awareness;Temperament/Personality;Thought;Understand    Psychosocial Skills  Coping Strategies;Environmental  Adaptations;Interpersonal Interaction;Routines and Behaviors;Habits           Patient will benefit from skilled therapeutic intervention in order to improve the following deficits and impairments:   Body Structure / Function / Physical Skills: ADL Cognitive Skills: Attention, Consciousness, Emotional, Energy/Drive, Memory, Perception, Problem Solve, Safety Awareness, Temperament/Personality, Thought, Understand Psychosocial Skills: Coping Strategies, Environmental  Adaptations, Interpersonal Interaction, Routines and Behaviors, Habits   Visit Diagnosis: Difficulty coping  Frontal lobe and executive function deficit  Bipolar II disorder [facility])    Problem List Patient Active Problem List   Diagnosis Date Noted  . Tobacco abuse 04/06/2020  . Hyperlipidemia 04/06/2020  . CAD (coronary artery disease) 04/06/2020  . STEMI (ST elevation myocardial infarction) (HCC) 04/05/2020  . Depressive disorder 12/06/2016  . Type 1 diabetes mellitus (HCC) 11/04/2016    04/27/2020  Donne Hazel, MOT, OTR/L  04/27/2020, 12:17 PM  [facility] PARTIAL HOSPITALIZATION PROGRAM [address] [place], [place], [address] Phone: [phone]   Fax:  [phone]  Name: Alicia Phillips MRN: [id number] Date of Birth: 07-02-1981

## 2020-04-28 ENCOUNTER — Other Ambulatory Visit: Payer: Self-pay

## 2020-04-28 ENCOUNTER — Telehealth (HOSPITAL_COMMUNITY): Payer: Self-pay | Admitting: *Deleted

## 2020-04-28 ENCOUNTER — Other Ambulatory Visit (HOSPITAL_COMMUNITY): Payer: BC Managed Care – PPO | Admitting: Occupational Therapy

## 2020-04-28 ENCOUNTER — Encounter (HOSPITAL_COMMUNITY): Payer: Self-pay

## 2020-04-28 ENCOUNTER — Other Ambulatory Visit (HOSPITAL_COMMUNITY): Payer: BC Managed Care – PPO | Attending: Psychiatry | Admitting: Licensed Clinical Social Worker

## 2020-04-28 DIAGNOSIS — Z79899 Other long term (current) drug therapy: Secondary | ICD-10-CM | POA: Insufficient documentation

## 2020-04-28 DIAGNOSIS — R41844 Frontal lobe and executive function deficit: Secondary | ICD-10-CM

## 2020-04-28 DIAGNOSIS — F332 Major depressive disorder, recurrent severe without psychotic features: Secondary | ICD-10-CM | POA: Diagnosis not present

## 2020-04-28 DIAGNOSIS — F419 Anxiety disorder, unspecified: Secondary | ICD-10-CM | POA: Diagnosis not present

## 2020-04-28 DIAGNOSIS — F439 Reaction to severe stress, unspecified: Secondary | ICD-10-CM | POA: Insufficient documentation

## 2020-04-28 DIAGNOSIS — F3181 Bipolar II disorder: Secondary | ICD-10-CM

## 2020-04-28 DIAGNOSIS — F411 Generalized anxiety disorder: Secondary | ICD-10-CM

## 2020-04-28 DIAGNOSIS — R4589 Other symptoms and signs involving emotional state: Secondary | ICD-10-CM

## 2020-04-28 NOTE — Telephone Encounter (Addendum)
Alicia Phillips called back and was a bit confused about where she wanted to go for cardiac rehab. Ahava was under the impression that [facility] only has 1 cardiac rehab phase II class at 7am. Right now for Alicia Phillips we are looking at November for start date.  Alicia Phillips I would contact [facility] to find out the days of the week and times as well as first available appt now that she has completed her cardiology follow up.  Unable to speak with someone at [facility]. Email sent to team supervisor. Alanson Aly, BSN Cardiac and Emergency planning/management officer

## 2020-04-28 NOTE — Telephone Encounter (Signed)
Received second referral for this pt to participate in cardiac rehab here at [facility].  Pt stated during her initial call that she works in [employer] and would like to go to [employer].  Referral for cardiac rehab was sent to [facility].  Documented on the referral that pt was contacted on 9/21 and message was left.  I called pt to clarify which program she wanted to participate in cardiac rehab.  Call went straight to voicemail. Message left to please contact and number was provided.  Alanson Aly, BSN Cardiac and Emergency planning/management officer

## 2020-04-28 NOTE — Therapy (Signed)
[facility] PARTIAL HOSPITALIZATION PROGRAM [address] [place], [place], [address] Phone: [phone]   Fax:  [phone] Virtual Visit via Video Note  I connected with Alicia Phillips on 04/28/20 at  11:00 AM EDT by a video enabled telemedicine application and verified that I am speaking with the correct person using two identifiers.   I discussed the limitations of evaluation and management by telemedicine and the availability of in person appointments. The patient expressed understanding and agreed to proceed.   I discussed the assessment and treatment plan with the patient. The patient was provided an opportunity to ask questions and all were answered. The patient agreed with the plan and demonstrated an understanding of the instructions.   The patient was advised to call back or seek an in-person evaluation if the symptoms worsen or if the condition fails to improve as anticipated.  Location: Patient - Patient Home OT - Clinic Office  I provided 60 minutes of non-face-to-face time during this encounter.   Donne Hazel, OT   Occupational Therapy Treatment  Patient Details  Name: Alicia Phillips MRN: [id number] Date of Birth: [birth year] Referring Provider (OT): Hillery Jacks   Encounter Date: 04/28/2020   OT End of Session - 04/28/20 1222    Visit Number 7    Number of Visits 20    Date for OT Re-Evaluation 05/17/20    Authorization Type BCBS    Authorization Time Period no auth or visit limit    OT Start Time 1105    OT Stop Time 1205    OT Time Calculation (min) 60 min    Activity Tolerance Patient tolerated treatment well    Behavior During Therapy WFL for tasks assessed/performed           Past Medical History:  Diagnosis Date  . Anxiety   . Bipolar disorder (HCC)   . CAD (coronary artery disease)    a. LHC 04/05/20: 95% stenosis of mid LAD s/p DES, 30% stenosis of proximal to mid LAD  . Diabetes mellitus type 1 (HCC)   .  History of borderline personality disorder   . Hyperlipidemia   . Major depression   . STEMI (ST elevation myocardial infarction) (HCC) 04/05/2020  . Tobacco use     Past Surgical History:  Procedure Laterality Date  . CORONARY STENT INTERVENTION N/A 04/05/2020   Procedure: CORONARY STENT INTERVENTION;  Surgeon: Iran Ouch, MD;  [facility];  Service: Cardiovascular;  Laterality: N/A;  . LEFT HEART CATH AND CORONARY ANGIOGRAPHY N/A 04/05/2020   Procedure: LEFT HEART CATH AND CORONARY ANGIOGRAPHY;  Surgeon: Iran Ouch, MD;  [facility];  Service: Cardiovascular;  Laterality: N/A;    There were no vitals filed for this visit.   Subjective Assessment - 04/28/20 1222    Currently in Pain? No/denies    Pain Score 0-No pain                                OT Education - 04/28/20 1222    Education Details Educated on anger management strategies and coping skills    Person(s) Educated Patient    Methods Explanation;Handout    Comprehension Verbalized understanding            OT Short Term Goals - 04/20/20 1554      OT SHORT TERM GOAL #1   Status On-going  OT SHORT TERM GOAL #2   Status On-going      OT SHORT TERM GOAL #3   Status On-going      OT SHORT TERM GOAL #4   Status On-going         Group Session:  S: "I think I can be a very volatile person, but then I immediately sink into a deep guilt afterwards."     O: Group began with a reflection from previous OT session focused on practicing assertiveness skills and group members shared whether or not they utilized their XYZ statements created. Today's session focused on the topic of anger and anger management with group members encouraged to identify how anger presents itself. Members were given a handout that listed several metaphors around how anger presents and group members shared and explained how anger presented within themselves and brainstormed  ways in which one could manage and cope including use of distractions, sensory stimulation, and coping strategies  A: Alicia Phillips was active and independent in her participation of discussion and activity, sharing that her anger presents itself as an "emotional roller coaster" or "emotional yo-yo" noting that it goes up and down very quickly. Pt shared that when it goes 'up' she feels anger and when it goes 'down' she can feel deep depths of guilt and identified managing at times through scratching herself and releasing the tension she feels. She appeared receptive to additional strategies and suggestions provided to her with expressed interest in using something like ice or a sour candy to counteract or "freeze" the intense emotions being felt. Pt also identified interest in downloading a coloring app to the phone to distract and engage in brain fitness activities like practicing a new language.    P: Continue to attend PHP OT group sessions 5x week for 2 weeks to promote daily structure, social engagement, and opportunities to develop and utilize adaptive strategies to maximize functional performance in preparation for safe transition and integration back into school, work, and the community. Plan to address topic of fair fighting in next OT group session.    Plan - 04/28/20 1222    Occupational performance deficits (Please refer to evaluation for details): ADL's;IADL's;Rest and Sleep;Education;Work;Leisure;Social Participation    Body Structure / Function / Physical Skills ADL    Cognitive Skills Attention;Consciousness;Emotional;Energy/Drive;Memory;Perception;Problem Solve;Safety Awareness;Temperament/Personality;Thought;Understand    Psychosocial Skills Coping Strategies;Environmental  Adaptations;Interpersonal Interaction;Routines and Behaviors;Habits           Patient will benefit from skilled therapeutic intervention in order to improve the following deficits and impairments:   Body Structure /  Function / Physical Skills: ADL Cognitive Skills: Attention, Consciousness, Emotional, Energy/Drive, Memory, Perception, Problem Solve, Safety Awareness, Temperament/Personality, Thought, Understand Psychosocial Skills: Coping Strategies, Environmental  Adaptations, Interpersonal Interaction, Routines and Behaviors, Habits   Visit Diagnosis: Difficulty coping  Frontal lobe and executive function deficit  Bipolar II disorder [facility])    Problem List Patient Active Problem List   Diagnosis Date Noted  . Tobacco abuse 04/06/2020  . Hyperlipidemia 04/06/2020  . CAD (coronary artery disease) 04/06/2020  . STEMI (ST elevation myocardial infarction) (HCC) 04/05/2020  . Depressive disorder 12/06/2016  . Type 1 diabetes mellitus (HCC) 11/04/2016    04/28/2020  Donne Hazel, MOT, OTR/L  04/28/2020, 12:23 PM  [facility] PARTIAL HOSPITALIZATION PROGRAM [address] [place], [place], [address] Phone: [phone]   Fax:  [phone]  Name: Alicia Phillips MRN: [id number] Date of Birth: [birth year]

## 2020-04-29 DIAGNOSIS — Z20828 Contact with and (suspected) exposure to other viral communicable diseases: Secondary | ICD-10-CM | POA: Diagnosis not present

## 2020-05-01 ENCOUNTER — Telehealth (HOSPITAL_COMMUNITY): Payer: Self-pay | Admitting: *Deleted

## 2020-05-01 ENCOUNTER — Other Ambulatory Visit (HOSPITAL_COMMUNITY): Payer: BC Managed Care – PPO | Admitting: Occupational Therapy

## 2020-05-01 ENCOUNTER — Other Ambulatory Visit (HOSPITAL_COMMUNITY): Payer: BC Managed Care – PPO | Admitting: Licensed Clinical Social Worker

## 2020-05-01 ENCOUNTER — Encounter (HOSPITAL_COMMUNITY): Payer: Self-pay

## 2020-05-01 ENCOUNTER — Other Ambulatory Visit: Payer: Self-pay

## 2020-05-01 DIAGNOSIS — R4589 Other symptoms and signs involving emotional state: Secondary | ICD-10-CM

## 2020-05-01 DIAGNOSIS — Z79899 Other long term (current) drug therapy: Secondary | ICD-10-CM | POA: Diagnosis not present

## 2020-05-01 DIAGNOSIS — R41844 Frontal lobe and executive function deficit: Secondary | ICD-10-CM

## 2020-05-01 DIAGNOSIS — F411 Generalized anxiety disorder: Secondary | ICD-10-CM

## 2020-05-01 DIAGNOSIS — F419 Anxiety disorder, unspecified: Secondary | ICD-10-CM | POA: Diagnosis not present

## 2020-05-01 DIAGNOSIS — F3181 Bipolar II disorder: Secondary | ICD-10-CM

## 2020-05-01 DIAGNOSIS — F439 Reaction to severe stress, unspecified: Secondary | ICD-10-CM | POA: Diagnosis not present

## 2020-05-01 DIAGNOSIS — F332 Major depressive disorder, recurrent severe without psychotic features: Secondary | ICD-10-CM | POA: Diagnosis not present

## 2020-05-01 NOTE — Telephone Encounter (Signed)
Received email back from [facility] cardiac rehab program with class times and where they are with scheduling.  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 Pt given [facility] contact number. Staff made aware of her decision. Alanson Aly, BSN Cardiac and Emergency planning/management officer

## 2020-05-01 NOTE — Therapy (Signed)
[facility] PARTIAL HOSPITALIZATION PROGRAM [address] [place], [place], [address] Phone: [phone]   Fax:  [phone] Virtual Visit via Video Note  I connected with Alicia Phillips on 05/01/20 at  11:25 AM EDT by a video enabled telemedicine application and verified that I am speaking with the correct person using two identifiers.   I discussed the limitations of evaluation and management by telemedicine and the availability of in person appointments. The patient expressed understanding and agreed to proceed.    I discussed the assessment and treatment plan with the patient. The patient was provided an opportunity to ask questions and all were answered. The patient agreed with the plan and demonstrated an understanding of the instructions.   The patient was advised to call back or seek an in-person evaluation if the symptoms worsen or if the condition fails to improve as anticipated.  Location: Patient - Patient Home OT - Clinic Office   I provided 60 minutes of non-face-to-face time during this encounter.   Donne Hazel, OT   Occupational Therapy Treatment  Patient Details  Name: Alicia Phillips MRN: [id number] Date of Birth: [birth year] Referring Provider (OT): Hillery Jacks   Encounter Date: 05/01/2020   OT End of Session - 05/01/20 1312    Visit Number 8    Number of Visits 20    Date for OT Re-Evaluation 05/17/20    Authorization Type BCBS    Authorization Time Period no auth or visit limit    OT Start Time 1125    OT Stop Time 1225    OT Time Calculation (min) 60 min    Activity Tolerance Patient tolerated treatment well    Behavior During Therapy WFL for tasks assessed/performed           Past Medical History:  Diagnosis Date  . Anxiety   . Bipolar disorder (HCC)   . CAD (coronary artery disease)    a. LHC 04/05/20: 95% stenosis of mid LAD s/p DES, 30% stenosis of proximal to mid LAD  . Diabetes mellitus type 1 (HCC)    . History of borderline personality disorder   . Hyperlipidemia   . Major depression   . STEMI (ST elevation myocardial infarction) (HCC) 04/05/2020  . Tobacco use     Past Surgical History:  Procedure Laterality Date  . CORONARY STENT INTERVENTION N/A 04/05/2020   Procedure: CORONARY STENT INTERVENTION;  Surgeon: Iran Ouch, MD;  [facility];  Service: Cardiovascular;  Laterality: N/A;  . LEFT HEART CATH AND CORONARY ANGIOGRAPHY N/A 04/05/2020   Procedure: LEFT HEART CATH AND CORONARY ANGIOGRAPHY;  Surgeon: Iran Ouch, MD;  [facility]: [facility];  Service: Cardiovascular;  Laterality: N/A;    There were no vitals filed for this visit.   Subjective Assessment - 05/01/20 1311    Currently in Pain? No/denies    Pain Score 0-No pain                                OT Education - 05/01/20 1311    Education Details Educated on fair fighting and assertiveness skills training    Person(s) Educated Patient    Methods Explanation;Handout    Comprehension Verbalized understanding            OT Short Term Goals - 04/20/20 1554      OT SHORT TERM GOAL #1   Status On-going  OT SHORT TERM GOAL #2   Status On-going      OT SHORT TERM GOAL #3   Status On-going      OT SHORT TERM GOAL #4   Status On-going         Group Session:  S: "I tend to brew in my feelings until I lash out."  O: Group began with a reflection from previous OT session on assertiveness skills and training. Group members shared an example of how they practiced being assertive post group and into their evening. Today's group was an expansion on assertive training and communication with a focus on Fair Fighting. Group members were provided with a set of 'rules' and tips on how to fight fairly, in order to come off as more assertive and less aggressive. Members identified one rule or tip they felt they struggled most with and were encouraged to focus on  improving their communication within identified rule.    A: Alicia Phillips was active and independent in her participation of discussion and activity, sharing that she tends to "brew" in her feelings before she lashes out or expresses her emotions/feelings during a conversation. She also shared that she makes a certain "face" and is often negatively received from the other party in the conversation, and identified interest in learning strategies to recognize when this occurs. Pt identified "grounding myself" as a way to manage her emotions and refrain from lashing out, including asking questions out loud and validating her emotions in the moment, as they are. Pt appeared receptive and interested in discussion and education received on additional strategies to "fight fairly" and practice assertiveness.    P: Continue to attend PHP OT group sessions 5x week for 1 weeks to promote daily structure, social engagement, and opportunities to develop and utilize adaptive strategies to maximize functional performance in preparation for safe transition and integration back into school, work, and the community.    Plan - 05/01/20 1312    Occupational performance deficits (Please refer to evaluation for details): ADL's;IADL's;Rest and Sleep;Education;Work;Leisure;Social Participation    Body Structure / Function / Physical Skills ADL    Cognitive Skills Attention;Consciousness;Emotional;Energy/Drive;Memory;Perception;Problem Solve;Safety Awareness;Temperament/Personality;Thought;Understand    Psychosocial Skills Coping Strategies;Environmental  Adaptations;Interpersonal Interaction;Routines and Behaviors;Habits           Patient will benefit from skilled therapeutic intervention in order to improve the following deficits and impairments:   Body Structure / Function / Physical Skills: ADL Cognitive Skills: Attention, Consciousness, Emotional, Energy/Drive, Memory, Perception, Problem Solve, Safety Awareness,  Temperament/Personality, Thought, Understand Psychosocial Skills: Coping Strategies, Environmental  Adaptations, Interpersonal Interaction, Routines and Behaviors, Habits   Visit Diagnosis: Difficulty coping  Frontal lobe and executive function deficit  Bipolar II disorder [facility])    Problem List Patient Active Problem List   Diagnosis Date Noted  . Tobacco abuse 04/06/2020  . Hyperlipidemia 04/06/2020  . CAD (coronary artery disease) 04/06/2020  . STEMI (ST elevation myocardial infarction) (HCC) 04/05/2020  . Depressive disorder 12/06/2016  . Type 1 diabetes mellitus (HCC) 11/04/2016    05/01/2020  Donne Hazel, MOT, OTR/L  05/01/2020, 1:12 PM  [facility] HOSPITALIZATION PROGRAM [address] [place], [place], [address] Phone: [phone]   Fax:  [phone]  Name: MEAGHANN CHOO MRN: [id number] Date of Birth: 1981/03/12

## 2020-05-01 NOTE — Progress Notes (Signed)
Spoke with patient via Webex video call, used 2 identifiers to correctly identify patient. States she had a pretty good weekend, she had to quarantine though because her sister has COVID and she could have been exposed. She feels the Abilify she started has been helping her moods. She does not feel as tearful. No side effects. No issues or complaints. Groups are going great. Thinks she will be discharged this week but not sure of her discharge plan yet. Denies SI/HI or AV hallucinations.  On scale 1-10 as 10 being worst she rates depression at 7 and anxiety at 8.5. PHQ9=13.

## 2020-05-02 ENCOUNTER — Other Ambulatory Visit: Payer: Self-pay

## 2020-05-02 ENCOUNTER — Other Ambulatory Visit (HOSPITAL_COMMUNITY): Payer: BC Managed Care – PPO | Admitting: Occupational Therapy

## 2020-05-02 ENCOUNTER — Telehealth: Payer: Self-pay | Admitting: Cardiovascular Disease

## 2020-05-02 ENCOUNTER — Emergency Department (HOSPITAL_COMMUNITY)
Admission: EM | Admit: 2020-05-02 | Discharge: 2020-05-02 | Disposition: A | Discharge status: Home / Self Care | Payer: BC Managed Care – PPO | Attending: Emergency Medicine | Admitting: Emergency Medicine

## 2020-05-02 ENCOUNTER — Other Ambulatory Visit (HOSPITAL_COMMUNITY): Payer: BC Managed Care – PPO | Admitting: Licensed Clinical Social Worker

## 2020-05-02 ENCOUNTER — Emergency Department (HOSPITAL_COMMUNITY): Payer: BC Managed Care – PPO

## 2020-05-02 ENCOUNTER — Encounter (HOSPITAL_COMMUNITY): Payer: Self-pay

## 2020-05-02 ENCOUNTER — Encounter (HOSPITAL_COMMUNITY): Payer: Self-pay | Admitting: Family

## 2020-05-02 DIAGNOSIS — F439 Reaction to severe stress, unspecified: Secondary | ICD-10-CM | POA: Diagnosis not present

## 2020-05-02 DIAGNOSIS — R110 Nausea: Secondary | ICD-10-CM | POA: Diagnosis not present

## 2020-05-02 DIAGNOSIS — R002 Palpitations: Secondary | ICD-10-CM | POA: Diagnosis not present

## 2020-05-02 DIAGNOSIS — F411 Generalized anxiety disorder: Secondary | ICD-10-CM

## 2020-05-02 DIAGNOSIS — Z79899 Other long term (current) drug therapy: Secondary | ICD-10-CM | POA: Insufficient documentation

## 2020-05-02 DIAGNOSIS — R41844 Frontal lobe and executive function deficit: Secondary | ICD-10-CM

## 2020-05-02 DIAGNOSIS — F332 Major depressive disorder, recurrent severe without psychotic features: Secondary | ICD-10-CM | POA: Diagnosis not present

## 2020-05-02 DIAGNOSIS — I2510 Atherosclerotic heart disease of native coronary artery without angina pectoris: Secondary | ICD-10-CM | POA: Insufficient documentation

## 2020-05-02 DIAGNOSIS — R079 Chest pain, unspecified: Secondary | ICD-10-CM | POA: Diagnosis not present

## 2020-05-02 DIAGNOSIS — F3181 Bipolar II disorder: Secondary | ICD-10-CM

## 2020-05-02 DIAGNOSIS — Z955 Presence of coronary angioplasty implant and graft: Secondary | ICD-10-CM | POA: Diagnosis not present

## 2020-05-02 DIAGNOSIS — Z87891 Personal history of nicotine dependence: Secondary | ICD-10-CM | POA: Insufficient documentation

## 2020-05-02 DIAGNOSIS — F331 Major depressive disorder, recurrent, moderate: Secondary | ICD-10-CM

## 2020-05-02 DIAGNOSIS — R0602 Shortness of breath: Secondary | ICD-10-CM | POA: Diagnosis not present

## 2020-05-02 DIAGNOSIS — Z7982 Long term (current) use of aspirin: Secondary | ICD-10-CM | POA: Diagnosis not present

## 2020-05-02 DIAGNOSIS — R000 Tachycardia, unspecified: Secondary | ICD-10-CM | POA: Diagnosis not present

## 2020-05-02 DIAGNOSIS — Z794 Long term (current) use of insulin: Secondary | ICD-10-CM | POA: Insufficient documentation

## 2020-05-02 DIAGNOSIS — F419 Anxiety disorder, unspecified: Secondary | ICD-10-CM | POA: Diagnosis not present

## 2020-05-02 DIAGNOSIS — E109 Type 1 diabetes mellitus without complications: Secondary | ICD-10-CM | POA: Insufficient documentation

## 2020-05-02 DIAGNOSIS — R4589 Other symptoms and signs involving emotional state: Secondary | ICD-10-CM

## 2020-05-02 LAB — BASIC METABOLIC PANEL
Anion gap: 12 (ref 5–15)
BUN: 6 mg/dL (ref 6–20)
CO2: 23 mmol/L (ref 22–32)
Calcium: 9.7 mg/dL (ref 8.9–10.3)
Chloride: 104 mmol/L (ref 98–111)
Creatinine, Ser: 0.69 mg/dL (ref 0.44–1.00)
GFR calc non Af Amer: >60 mL/min (ref >60)
Glucose, Bld: 99 mg/dL (ref 70–99)
Potassium: 4.5 mmol/L (ref 3.5–5.1)
Sodium: 139 mmol/L (ref 135–145)

## 2020-05-02 LAB — CBC
HCT: 39.8 % (ref 36.0–46.0)
Hemoglobin: 13 g/dL (ref 12.0–15.0)
MCH: 29.7 pg (ref 26.0–34.0)
MCHC: 32.7 g/dL (ref 30.0–36.0)
MCV: 91.1 fL (ref 80.0–100.0)
Platelets: 396 10*3/uL (ref 150–400)
RBC: 4.37 MIL/uL (ref 3.87–5.11)
RDW: 12.8 % (ref 11.5–15.5)
WBC: 9.5 10*3/uL (ref 4.0–10.5)
nRBC: 0 % (ref 0.0–0.2)

## 2020-05-02 LAB — I-STAT BETA HCG BLOOD, ED (MC, WL, AP ONLY): I-stat hCG, quantitative: <5 m[IU]/mL (ref <5)

## 2020-05-02 LAB — TROPONIN I (HIGH SENSITIVITY)
Troponin I (High Sensitivity): 4 ng/L (ref <18)
Troponin I (High Sensitivity): 5 ng/L (ref <18)

## 2020-05-02 MED ORDER — DOXYCYCLINE HYCLATE 50 MG PO CAPS
50.0000 mg | ORAL_CAPSULE | Freq: Every day | ORAL | 0 refills | Status: DC
Start: 2020-05-02 — End: 2020-08-11

## 2020-05-02 MED ORDER — ARIPIPRAZOLE 5 MG PO TABS: 5.0000 mg | ORAL_TABLET | Freq: Every day | ORAL | 0 refills | Status: DC

## 2020-05-02 MED ORDER — LAMOTRIGINE 100 MG PO TABS: 100.0000 mg | ORAL_TABLET | Freq: Every day | ORAL | 1 refills | Status: AC

## 2020-05-02 NOTE — Progress Notes (Signed)
Virtual Visit via Video Note  I connected with Alicia Phillips on 05/02/20 at  9:00 AM EDT by a video enabled telemedicine application and verified that I am speaking with the correct person using two identifiers.   I discussed the limitations of evaluation and management by telemedicine and the availability of in person appointments. The patient expressed understanding and agreed to proceed.   I discussed the assessment and treatment plan with the patient. The patient was provided an opportunity to ask questions and all were answered. The patient agreed with the plan and demonstrated an understanding of the instructions.   The patient was advised to call back or seek an in-person evaluation if the symptoms worsen or if the condition fails to improve as anticipated.  I provided 15  minutes of non-face-to-face time during this encounter.   [name], NP Virtual Visit via Video Note  I connected with Alicia Phillips on 05/02/20 at  9:00 AM EDT by a video enabled telemedicine application and verified that I am speaking with the correct person using two identifiers.   I discussed the limitations of evaluation and management by telemedicine and the availability of in person appointments. The patient expressed understanding and agreed to proceed.   I discussed the assessment and treatment plan with the patient. The patient was provided an opportunity to ask questions and all were answered. The patient agreed with the plan and demonstrated an understanding of the instructions.   The patient was advised to call back or seek an in-person evaluation if the symptoms worsen or if the condition fails to improve as anticipated.  I provided 15 minutes of non-face-to-face time during this encounter.   [name], NP    Chart opened for discharge however patient is requesting additional days in partial.

## 2020-05-02 NOTE — Telephone Encounter (Signed)
Patient c/o Palpitations:  High priority if patient c/o lightheadedness, shortness of breath, or chest pain  1) How long have you had palpitations/irregular HR/ Afib? Are you having the symptoms now? Irregular HR, not happening right now but happens 1-2x daily  2) Are you currently experiencing lightheadedness, SOB or CP? no  3) Do you have a history of afib (atrial fibrillation) or irregular heart rhythm? Not that she knows of  4) Have you checked your BP or HR? (document readings if available): no  5) Are you experiencing any other symptoms? Denies any other symptoms.

## 2020-05-02 NOTE — ED Notes (Signed)
Patient reports pain like heart is racing and having PVC.  Reports MI with LAD 95% occluded on 04/05/20.  Reports pain feels similar. Reassured patient today cardiac markers are negative so far.  Patient reports anxiety since having MI in September.

## 2020-05-02 NOTE — ED Provider Notes (Signed)
[facility] EMERGENCY DEPARTMENT Provider Note   CSN: [id number] Arrival date & time: 05/02/20  1731   History No chief complaint on file.   Alicia Phillips is a 39 y.o. female.  The history is provided by the patient.  She has history of diabetes, hyperlipidemia, coronary artery disease status post stent placement, bipolar disorder and comes in today because of increased frequency of PVCs.  She states that she is worried because her symptoms of her heart attack were increased frequency of PVCs.  She denies chest pain, heaviness, tightness, pressure.  She denies dyspnea, nausea, diaphoresis.  She has not noticed any change in her exercise tolerance.  She is concerned because her heart rate has been consistently high today-has been 104 and 110.  She has been compliant with her medications.  Past Medical History:  Diagnosis Date  . Anxiety   . Bipolar disorder (HCC)   . CAD (coronary artery disease)    a. LHC 04/05/20: 95% stenosis of mid LAD s/p DES, 30% stenosis of proximal to mid LAD  . Diabetes mellitus type 1 (HCC)   . History of borderline personality disorder   . Hyperlipidemia   . Major depression   . STEMI (ST elevation myocardial infarction) (HCC) 04/05/2020  . Tobacco use     Patient Active Problem List   Diagnosis Date Noted  . Tobacco abuse 04/06/2020  . Hyperlipidemia 04/06/2020  . CAD (coronary artery disease) 04/06/2020  . STEMI (ST elevation myocardial infarction) (HCC) 04/05/2020  . Depressive disorder 12/06/2016  . Type 1 diabetes mellitus (HCC) 11/04/2016    Past Surgical History:  Procedure Laterality Date  . CORONARY STENT INTERVENTION N/A 04/05/2020   Procedure: CORONARY STENT INTERVENTION;  Surgeon: Iran Ouch, MD;  Location: [facility] INVASIVE CV LAB;  Service: Cardiovascular;  Laterality: N/A;  . LEFT HEART CATH AND CORONARY ANGIOGRAPHY N/A 04/05/2020   Procedure: LEFT HEART CATH AND CORONARY ANGIOGRAPHY;  Surgeon: Iran Ouch,  MD;  Location: [facility] INVASIVE CV LAB;  Service: Cardiovascular;  Laterality: N/A;     OB History   No obstetric history on file.     Family History  Problem Relation Age of Onset  . Depression Father   . Breast cancer Maternal Grandmother   . Depression Brother   . Anxiety disorder Brother   . ADD / ADHD Brother   . Alcohol abuse Brother   . Alcohol abuse Maternal Grandfather     Social History   Tobacco Use  . Smoking status: Former Games developer  . Smokeless tobacco: Never Used  . Tobacco comment: quit after  heartattack Sept 2021  Vaping Use  . Vaping Use: Never used  Substance Use Topics  . Alcohol use: Never  . Drug use: Never    Home Medications Prior to Admission medications   Medication Sig Start Date End Date Taking? Authorizing Provider  ACETAMINOPHEN EXTRA STRENGTH 500 MG tablet Take 500 mg by mouth every 4 (four) hours as needed for mild pain or headache.  05/06/19   [provider]  ARIPiprazole (ABILIFY) 5 MG tablet Take 1 tablet (5 mg total) by mouth daily. 05/02/20 05/02/21  [name], NP  aspirin 81 MG chewable tablet Chew 1 tablet (81 mg total) by mouth daily. 04/06/20   Marjie Skiff E, PA-C  atorvastatin (LIPITOR) 80 MG tablet Take 1 tablet (80 mg total) by mouth daily. 04/06/20   Marjie Skiff E, PA-C  doxycycline (VIBRAMYCIN) 50 MG capsule Take 1 capsule (50 mg  total) by mouth daily. 05/02/20   Dione Booze, MD  hydrOXYzine (ATARAX/VISTARIL) 10 MG tablet Take 10 mg by mouth every 8 (eight) hours as needed for anxiety.    [provider]  insulin aspart (NOVOLOG) 100 UNIT/ML injection Inject 0-20 Units into the skin 3 (three) times daily before meals. Per sliding scale    [provider]  lamoTRIgine (LAMICTAL) 100 MG tablet Take 1 tablet (100 mg total) by mouth at bedtime. Take one tablet at bedtime. 05/02/20   [name], NP  metoprolol succinate (TOPROL-XL) 25 MG 24 hr tablet Take 1 tablet (25 mg total) by mouth daily. Take  with or immediately following a meal. 04/13/20 07/12/20  [facility], Roe Rutherford, PA  nitroGLYCERIN (NITROSTAT) 0.4 MG SL tablet Place 1 tablet (0.4 mg total) under the tongue every 5 (five) minutes as needed for chest pain. 04/06/20 04/06/21  Corrin Parker, PA-C  ticagrelor (BRILINTA) 90 MG TABS tablet Take 1 tablet (90 mg total) by mouth 2 (two) times daily. 04/06/20   Marjie Skiff E, PA-C  TRESIBA FLEXTOUCH 100 UNIT/ML FlexTouch Pen Inject 25 Units into the skin daily.  04/03/20   [provider]    Allergies    Sulfamethoxazole-trimethoprim  Review of Systems   Review of Systems  All other systems reviewed and are negative.   Physical Exam Updated Vital Signs BP 132/77 (BP Location: Right Arm)   Pulse 99   Temp 99.2 F (37.3 C) (Oral)   Resp 16   Ht 5\' 7"  (1.702 m)   Wt 74.8 kg   LMP 04/04/2020 (Exact Date)   SpO2 98%   BMI 25.84 kg/m   Physical Exam Vitals and nursing note reviewed.   39 year old female, resting comfortably and in no acute distress. Vital signs are normal. Oxygen saturation is 98%, which is normal. Head is normocephalic and atraumatic. PERRLA, EOMI. Oropharynx is clear. Neck is nontender and supple without adenopathy or JVD. Back is nontender and there is no CVA tenderness. Lungs are clear without rales, wheezes, or rhonchi. Chest is nontender. Heart has regular rate and rhythm without murmur. Abdomen is soft, flat, nontender without masses or hepatosplenomegaly and peristalsis is normoactive. Extremities have no cyanosis or edema, full range of motion is present. Skin is warm and dry without rash. Neurologic: Mental status is normal, cranial nerves are intact, there are no motor or sensory deficits.  ED Results / Procedures / Treatments   Labs (all labs ordered are listed, but only abnormal results are displayed) Labs Reviewed  BASIC METABOLIC PANEL  CBC  I-STAT BETA HCG BLOOD, ED ([facility], [facility], [facility] ONLY)  TROPONIN I (HIGH SENSITIVITY)  TROPONIN  I (HIGH SENSITIVITY)    EKG SOKHA, CRAKER Linda Hedges 02-May-2020 17:33:00 04-May-2020 [facility] ROUTINE RECORD Sinus tachycardia Nonspecific T wave abnormality Abnormal ECG When compared with ECG of 04/11/2020, T wave abnormality Lateral leads has improved T wave abnormality Anterior leads is now present Confirmed by 04/13/2020 (Dione Booze) on 05/02/2020 9:09:27 PM 39mm/s 46mm/mV 100Hz  9.0.4 12SL 243 CID: 9m Confirmed By: Vent. rate 105 BPM PR interval 128 ms QRS duration 80 ms QT/QTc 346/457 ms P-R-T axes 59 75 89 [birth year] (39 yr) Female Caucasian Room:6E02C Loc:67 Technician:JUDITH DAVIDSON Test Dione Booze PCI  Radiology DG Chest 2 View  Result Date: 05/02/2020 CLINICAL DATA:  Chest pain and cardiac palpitations for 1 day EXAM: CHEST - 2 VIEW COMPARISON:  04/11/2020 FINDINGS: The heart size and mediastinal contours are within normal limits. Both  lungs are clear. The visualized skeletal structures are unremarkable. IMPRESSION: No active cardiopulmonary disease. Electronically Signed   By: Alcide Clever M.D.   On: 05/02/2020 18:24    Procedures Procedures   Medications Ordered in ED Medications - No data to display  ED Course  I have reviewed the triage vital signs and the nursing notes.  Pertinent labs & imaging results that were available during my care of the patient were reviewed by me and considered in my medical decision making (see chart for details).  MDM Rules/Calculators/A&P Subjective palpitations.  ECG shows no ectopy and for the time I was observing her cardiac monitor, no PVCs were seen.  She has been mildly tachycardic.  I suspect that this is related to her anxiety and baseline increased adrenergic level.  Her chest x-ray was unremarkable.  ECG shows some minor variation of anterior T waves but this is not felt to represent ischemia.  Electrolytes are normal including potassium.  She is on a very low-dose of metoprolol.  She is also  on a very low dose of hydroxyzine.  I have advised her to increase her metoprolol to twice a day, increase hydroxyzine if the current dose is not effective at alleviating her anxiety.  She did express concern that her stent may have moved and advised that that is not something that happens.  She has an appointment with her cardiologist in 3 days and is advised that they would make appropriate adjustments in her medications at that point.  She may benefit from Holter monitor to see how frequent her PVCs actually are.  However, with normal potassium today, I do not feel any intervention is needed beyond what is mentioned above.  Final Clinical Impression(s) / ED Diagnoses Final diagnoses:  Palpitations  Anxiety  S/P coronary artery stent placement    Rx / DC Orders ED Discharge Orders         Ordered    doxycycline (VIBRAMYCIN) 50 MG capsule  Daily        05/02/20 2137           Dione Booze, MD 05/02/20 2146

## 2020-05-02 NOTE — Therapy (Signed)
[facility] PARTIAL HOSPITALIZATION PROGRAM [address] [place], [place], [address] Phone: [phone]   Fax:  [phone] Virtual Visit via Video Note  I connected with Alicia Phillips on 05/02/20 at  11:00 AM EDT by a video enabled telemedicine application and verified that I am speaking with the correct person using two identifiers.   I discussed the limitations of evaluation and management by telemedicine and the availability of in person appointments. The patient expressed understanding and agreed to proceed.    I discussed the assessment and treatment plan with the patient. The patient was provided an opportunity to ask questions and all were answered. The patient agreed with the plan and demonstrated an understanding of the instructions.   The patient was advised to call back or seek an in-person evaluation if the symptoms worsen or if the condition fails to improve as anticipated  Location: Patient - Patient Home OT - Clinic Office  I provided 60 minutes of non-face-to-face time during this encounter.   Donne Hazel, OT   Occupational Therapy Treatment  Patient Details  Name: Alicia Phillips MRN: [id number] Date of Birth: Dec 18, 1980 Referring Provider (OT): Hillery Jacks   Encounter Date: 05/02/2020   OT End of Session - 05/02/20 1657    Visit Number 9    Number of Visits 20    Date for OT Re-Evaluation 05/17/20    Authorization Type [employer]    Authorization Time Period no auth or visit limit    OT Start Time 1115    OT Stop Time 1215    OT Time Calculation (min) 60 min    Activity Tolerance Patient tolerated treatment well    Behavior During Therapy [facility] for tasks assessed/performed           Past Medical History:  Diagnosis Date  . Anxiety   . Bipolar disorder (HCC)   . CAD (coronary artery disease)    a. LHC 04/05/20: 95% stenosis of mid LAD s/p DES, 30% stenosis of proximal to mid LAD  . Diabetes mellitus type 1 (HCC)   .  History of borderline personality disorder   . Hyperlipidemia   . Major depression   . STEMI (ST elevation myocardial infarction) (HCC) 04/05/2020  . Tobacco use     Past Surgical History:  Procedure Laterality Date  . CORONARY STENT INTERVENTION N/A 04/05/2020   Procedure: CORONARY STENT INTERVENTION;  Surgeon: Iran Ouch, MD;  Location: [facility] INVAS[facility] CV LAB;  Service: Cardiovascular;  Laterality: N/A;  . LEFT HEART CATH AND CORONARY ANGIOGRAPHY N/A 04/05/2020   Procedure: LEFT HEART CATH AND CORONARY ANGIOGRAPHY;  Surgeon: Iran Ouch, MD;  Location: [facility] CV LAB;  Service: Cardiovascular;  Laterality: N/A;    There were no vitals filed for this visit.   Subjective Assessment - 05/02/20 1656    Currently in Pain? No/denies    Pain Score 0-No pain                                OT Education - 05/02/20 1656    Education Details Educated on different factors that contribute to our ability to manage our time, along with specific time management tips/strategies    Person(s) Educated Patient    Methods Explanation;Handout    Comprehension Verbalized understanding            OT Short Term Goals - 04/20/20 1554  OT SHORT TERM GOAL #1   Status On-going      OT SHORT TERM GOAL #2   Status On-going      OT SHORT TERM GOAL #3   Status On-going      OT SHORT TERM GOAL #4   Status On-going         Group Session:  S: "I definitely like the creative motivator of fun calendars to look at and keep me on track."  O: Group began with a warm-up activity, where group members were encouraged to reflect on the question "If you had $86,400 and only had 24 hours to spend it, how would you use the money?" Today's group focused on topic of time management and group members looked at how, similarly to the warm-up, how can we spend our time effectively, knowing it is not something we can save or get back. Group members identified ways in which they  currently struggle with managing their time and discussion focused on alternative strategies to recognize how we can be more productive and intentional with our time and managing it appropriately.  A: Alicia Phillips was active and independent in her participation of discussion and activity, sharing that she is feeling 'overwhelmed' as she thinks about going back to work and transitioning back into the community at discharge. Pt appeared receptive to education and strategies provided on time management, noting benefit of writing down her schedule and having multiple visual cues as reminders (planner, calendar, alerts on phone). Pt expressed intent of setting aside time in her day to read through and answer her emails in order not to feel overwhelmed and manage her time more effectively.   P: Continue to attend PHP OT group sessions 5x week for 2 weeks to promote daily structure, social engagement, and opportunities to develop and utilize adaptive strategies to maximize functional performance in preparation for safe transition and integration back into school, work, and the community. Plan to continue topic of time management in next OT group session.   Plan - 05/02/20 1658    Occupational performance deficits (Please refer to evaluation for details): ADL's;IADL's;Rest and Sleep;Education;Work;Leisure;Social Participation    Body Structure / Function / Physical Skills ADL    Cognitive Skills Attention;Consciousness;Emotional;Energy/Drive;Memory;Perception;Problem Solve;Safety Awareness;Temperament/Personality;Thought;Understand    Psychosocial Skills Coping Strategies;Environmental  Adaptations;Interpersonal Interaction;Routines and Behaviors;Habits          Patient will benefit from skilled therapeutic intervention in order to improve the following deficits and impairments:   Body Structure / Function / Physical Skills: ADL Cognitive Skills: Attention, Consciousness, Emotional, Energy/Drive, Memory,  Perception, Problem Solve, Safety Awareness, Temperament/Personality, Thought, Understand Psychosocial Skills: Coping Strategies, Environmental  Adaptations, Interpersonal Interaction, Routines and Behaviors, Habits   Visit Diagnosis: Difficulty coping  Frontal lobe and executive function deficit  Bipolar II disorder [facility])    Problem List Patient Active Problem List   Diagnosis Date Noted  . Tobacco abuse 04/06/2020  . Hyperlipidemia 04/06/2020  . CAD (coronary artery disease) 04/06/2020  . STEMI (ST elevation myocardial infarction) (HCC) 04/05/2020  . Depressive disorder 12/06/2016  . Type 1 diabetes mellitus (HCC) 11/04/2016    05/02/2020  Donne Hazel, MOT, OTR/L  05/02/2020, 4:59 PM  [facility] PARTIAL HOSPITALIZATION PROGRAM [address] [place], [place], [address] Phone: [phone]   Fax:  [phone]  Name: Alicia Phillips MRN: [id number] Date of Birth: 08-31-1980

## 2020-05-02 NOTE — Telephone Encounter (Signed)
Spoke with the patient who states that she has been feeling some skipped heart beats for the past few days. She states that it has only happened a couple of times per day. She does not know what her heart rate has been. She denies chest pain, SOB, lightheadedness, dizziness, or syncope. She states that she does have hypervigilance which could be contributing. She denies any association with alcohol or caffeine use. She has a follow up appointment with Dr. Allyson Sabal already scheduled for Friday. She will call us back if any other symptoms develop before then.

## 2020-05-02 NOTE — ED Triage Notes (Signed)
To ED via [facility] , from home, had some palpitations, hx STEMI 04/05/20 95% LAD blockage, felt heart racing, took 324mg  ASA, NTG x 2, with some relief.  EMS VS 143/73 P-104 O2= 100% R- 20

## 2020-05-02 NOTE — Discharge Instructions (Signed)
If necessary, you may take an extra dose of hydroxyzine if the first dose does not help you within 20-30 minutes.  Starting tomorrow, take your metoprolol twice a day. When you see the cardiologist, he may make additional changes.

## 2020-05-03 ENCOUNTER — Encounter (HOSPITAL_COMMUNITY): Payer: Self-pay

## 2020-05-03 ENCOUNTER — Other Ambulatory Visit (HOSPITAL_COMMUNITY): Payer: BC Managed Care – PPO | Admitting: Occupational Therapy

## 2020-05-03 ENCOUNTER — Other Ambulatory Visit (HOSPITAL_COMMUNITY): Payer: BC Managed Care – PPO | Admitting: Licensed Clinical Social Worker

## 2020-05-03 DIAGNOSIS — F3181 Bipolar II disorder: Secondary | ICD-10-CM

## 2020-05-03 DIAGNOSIS — F419 Anxiety disorder, unspecified: Secondary | ICD-10-CM | POA: Diagnosis not present

## 2020-05-03 DIAGNOSIS — F439 Reaction to severe stress, unspecified: Secondary | ICD-10-CM | POA: Diagnosis not present

## 2020-05-03 DIAGNOSIS — F331 Major depressive disorder, recurrent, moderate: Secondary | ICD-10-CM

## 2020-05-03 DIAGNOSIS — Z79899 Other long term (current) drug therapy: Secondary | ICD-10-CM | POA: Diagnosis not present

## 2020-05-03 DIAGNOSIS — R4589 Other symptoms and signs involving emotional state: Secondary | ICD-10-CM

## 2020-05-03 DIAGNOSIS — R41844 Frontal lobe and executive function deficit: Secondary | ICD-10-CM

## 2020-05-03 DIAGNOSIS — F332 Major depressive disorder, recurrent severe without psychotic features: Secondary | ICD-10-CM | POA: Diagnosis not present

## 2020-05-03 DIAGNOSIS — F411 Generalized anxiety disorder: Secondary | ICD-10-CM

## 2020-05-03 MED ORDER — HYDROXYZINE HCL 10 MG PO TABS: 10.0000 mg | ORAL_TABLET | ORAL | 1 refills | Status: DC | PRN

## 2020-05-03 NOTE — Progress Notes (Signed)
BH MD/PA/NP OP Progress Note  05/03/2020 10:26 AM Alicia Phillips  MRN:  [id number]   Alicia Phillips was seen and evaluated via WebEx.  She was slated for discharge on 05/04/2020 however reports recent panic/anxiety attack on last night.  Stated " I thought I was having another heart attack."  Continues to report ruminations and worsening chest palpitations.  Alicia Phillips reports she does not feel ready to return back to work as of yet and was hopeful to continue with partial hospitalization programming.  Discussed completing partial hospitalization in addition to following up with intensive outpatient programming.  Patient was receptive to plan.  Will refill hydroxyzine 10 mg p.o. as needed. States that emergency room physician increased her beta-blocker to twice daily.  Support,encouragement and reassurance was provided.   Visit Diagnosis: No diagnosis found.  Past Psychiatric History:  Past Medical History:  Past Medical History:  Diagnosis Date  . Anxiety   . Bipolar disorder (HCC)   . CAD (coronary artery disease)    a. LHC 04/05/20: 95% stenosis of mid LAD s/p DES, 30% stenosis of proximal to mid LAD  . Diabetes mellitus type 1 (HCC)   . History of borderline personality disorder   . Hyperlipidemia   . Major depression   . STEMI (ST elevation myocardial infarction) (HCC) 04/05/2020  . Tobacco use     Past Surgical History:  Procedure Laterality Date  . CORONARY STENT INTERVENTION N/A 04/05/2020   Procedure: CORONARY STENT INTERVENTION;  Surgeon: Iran Ouch, MD;  [facility];  Service: Cardiovascular;  Laterality: N/A;  . LEFT HEART CATH AND CORONARY ANGIOGRAPHY N/A 04/05/2020   Procedure: LEFT HEART CATH AND CORONARY ANGIOGRAPHY;  Surgeon: Iran Ouch, MD;  [facility]: [facility];  Service: Cardiovascular;  Laterality: N/A;    Family Psychiatric History:  Family History:  Family History  Problem Relation Age of Onset  . Depression Father   . Breast  cancer Maternal Grandmother   . Depression Brother   . Anxiety disorder Brother   . ADD / ADHD Brother   . Alcohol abuse Brother   . Alcohol abuse Maternal Grandfather     Social History:  Social History   Socioeconomic History  . Marital status: Single    Spouse name: Not on file  . Number of children: Not on file  . Years of education: Not on file  . Highest education level: Not on file  Occupational History  . Not on file  Tobacco Use  . Smoking status: Former Games developer  . Smokeless tobacco: Never Used  . Tobacco comment: quit after  heartattack Sept 2021  Vaping Use  . Vaping Use: Never used  Substance and Sexual Activity  . Alcohol use: Never  . Drug use: Never  . Sexual activity: Not on file  Other Topics Concern  . Not on file  Social History Narrative  . Not on file   Social Determinants of Health   Financial Resource Strain:   . Difficulty of Paying Living Expenses: Not on file  Food Insecurity:   . Worried About Programme researcher, broadcasting/film/video in the Last Year: Not on file  . Ran Out of Food in the Last Year: Not on file  Transportation Needs:   . Lack of Transportation (Medical): Not on file  . Lack of Transportation (Non-Medical): Not on file  Physical Activity:   . Days of Exercise per Week: Not on file  . Minutes of Exercise per Session: Not on  file  Stress:   . Feeling of Stress : Not on file  Social Connections:   . Frequency of Communication with Friends and Family: Not on file  . Frequency of Social Gatherings with Friends and Family: Not on file  . Attends Religious Services: Not on file  . Active Member of Clubs or Organizations: Not on file  . Attends Banker Meetings: Not on file  . Marital Status: Not on file    Allergies:  Allergies  Allergen Reactions  . Sulfamethoxazole-Trimethoprim Rash    Metabolic Disorder Labs: Lab Results  Component Value Date   HGBA1C 8.6 (H) 04/06/2020   MPG 200.12 04/06/2020   No results found for:  PROLACTIN Lab Results  Component Value Date   CHOL 193 04/06/2020   TRIG 96 04/06/2020   HDL 50 04/06/2020   CHOLHDL 3.9 04/06/2020   VLDL 19 04/06/2020   LDLCALC 124 (H) 04/06/2020   No results found for: TSH  Therapeutic Level Labs: No results found for: LITHIUM No results found for: VALPROATE No components found for:  CBMZ  Current Medications: Current Outpatient Medications  Medication Sig Dispense Refill  . ACETAMINOPHEN EXTRA STRENGTH 500 MG tablet Take 500 mg by mouth every 4 (four) hours as needed for mild pain or headache.     . ARIPiprazole (ABILIFY) 5 MG tablet Take 1 tablet (5 mg total) by mouth daily. 30 tablet 0  . aspirin 81 MG chewable tablet Chew 1 tablet (81 mg total) by mouth daily.    Marland Kitchen atorvastatin (LIPITOR) 80 MG tablet Take 1 tablet (80 mg total) by mouth daily. 30 tablet 2  . doxycycline (VIBRAMYCIN) 50 MG capsule Take 1 capsule (50 mg total) by mouth daily. 30 capsule 0  . hydrOXYzine (ATARAX/VISTARIL) 10 MG tablet Take 10 mg by mouth every 8 (eight) hours as needed for anxiety.    . insulin aspart (NOVOLOG) 100 UNIT/ML injection Inject 0-20 Units into the skin 3 (three) times daily before meals. Per sliding scale    . lamoTRIgine (LAMICTAL) 100 MG tablet Take 1 tablet (100 mg total) by mouth at bedtime. Take one tablet at bedtime. 30 tablet 1  . metoprolol succinate (TOPROL-XL) 25 MG 24 hr tablet Take 1 tablet (25 mg total) by mouth daily. Take with or immediately following a meal. 90 tablet 3  . nitroGLYCERIN (NITROSTAT) 0.4 MG SL tablet Place 1 tablet (0.4 mg total) under the tongue every 5 (five) minutes as needed for chest pain. 25 tablet 2  . ticagrelor (BRILINTA) 90 MG TABS tablet Take 1 tablet (90 mg total) by mouth 2 (two) times daily. 60 tablet 11  . TRESIBA FLEXTOUCH 100 UNIT/ML FlexTouch Pen Inject 25 Units into the skin daily.      Current Facility-Administered Medications  Medication Dose Route Frequency Provider Last Rate Last Admin  . iron  polysaccharides (NIFEREX) capsule 150 mg  150 mg Oral Daily [facility], [place]oe [place], [place]         Musculoskeletal:   Psychiatric Specialty Exam: Review of Systems  Last menstrual period 04/04/2020.There is no height or weight on file to calculate BMI.  General Appearance: Casual and Guarded  Eye Contact:  Good  Speech:  Clear and Coherent  Volume:  Normal  Mood:  Anxious and Depressed  Affect:  Congruent  Thought Process:  Coherent  Orientation:  Full (Time, Place, and Person)  Thought Content: Logical   Suicidal Thoughts:  No  Homicidal Thoughts:  No  Memory:  Immediate;  Fair Recent;   Fair  Judgement:  Fair  Insight:  Fair  Psychomotor Activity:  Normal  Concentration:  Concentration: Fair  Recall:  [employer] of Knowledge: Fair  Language: Fair  Akathisia:  No  Handed:  Right  AIMS (if indicated):   Assets:  Communication Skills Desire for Improvement Resilience Social Support  ADL's:  Intact  Cognition: WNL  Sleep:  Fair   Screenings: PHQ2-9     Counselor from 05/01/2020 in [facility] PARTIAL HOSPITALIZATION PROGRAM Counselor from 04/21/2020 in [facility]AL [facility] PARTIAL HOSPITALIZATION PROGRAM  PHQ-2 Total Score 5 5  PHQ-9 Total Score 13 16       Assessment and Plan:  Continue partial hospitalization program As patient continues to need constant reassurance Refilled hydroxyzine 10 mg p.o. daily Continue medications as directed  Treatment plan was reviewed and agreed upon by NPT Shalia Bartko and patient Diasia Lotts's need for continued group services  [name], NP 05/03/2020, 10:26 AM

## 2020-05-03 NOTE — Therapy (Signed)
[facility] PARTIAL HOSPITALIZATION PROGRAM [address] [place], [place], [address] Phone: [phone]   Fax:  [phone] Virtual Visit via Video Note  I connected with Alicia Phillips on 05/03/20 at  12:00 PM EDT by a video enabled telemedicine application and verified that I am speaking with the correct person using two identifiers.   I discussed the limitations of evaluation and management by telemedicine and the availability of in person appointments. The patient expressed understanding and agreed to proceed.  I discussed the assessment and treatment plan with the patient. The patient was provided an opportunity to ask questions and all were answered. The patient agreed with the plan and demonstrated an understanding of the instructions.   The patient was advised to call back or seek an in-person evaluation if the symptoms worsen or if the condition fails to improve as anticipated.  Location: Patient - Patient Home OT - Clinic Office   I provided 50 minutes of non-face-to-face time during this encounter.   Donne Hazel, OT   Occupational Therapy Treatment  Patient Details  Name: Alicia Phillips MRN: [id number] Date of Birth: [birth year] Referring Provider (OT): Hillery Jacks   Encounter Date: 05/03/2020   OT End of Session - 05/03/20 1511    Visit Number 10    Number of Visits 20    Date for OT Re-Evaluation 05/17/20    Authorization Type BCBS    Authorization Time Period no auth or visit limit    OT Start Time 1200    OT Stop Time 1250    OT Time Calculation (min) 50 min    Activity Tolerance Patient tolerated treatment well    Behavior During Therapy [facility] for tasks assessed/performed           Past Medical History:  Diagnosis Date  . Anxiety   . Bipolar disorder (HCC)   . CAD (coronary artery disease)    a. LHC 04/05/20: 95% stenosis of mid LAD s/p DES, 30% stenosis of proximal to mid LAD  . Diabetes mellitus type 1 (HCC)   .  History of borderline personality disorder   . Hyperlipidemia   . Major depression   . STEMI (ST elevation myocardial infarction) (HCC) 04/05/2020  . Tobacco use     Past Surgical History:  Procedure Laterality Date  . CORONARY STENT INTERVENTION N/A 04/05/2020   Procedure: CORONARY STENT INTERVENTION;  Surgeon: Iran Ouch, MD;  [facility];  Service: Cardiovascular;  Laterality: N/A;  . LEFT HEART CATH AND CORONARY ANGIOGRAPHY N/A 04/05/2020   Procedure: LEFT HEART CATH AND CORONARY ANGIOGRAPHY;  Surgeon: Iran Ouch, MD;  [facility];  Service: Cardiovascular;  Laterality: N/A;    There were no vitals filed for this visit.   Subjective Assessment - 05/03/20 1511    Currently in Pain? No/denies                                OT Education - 05/03/20 1511    Education Details Educated on time management strategies and work/life balance    Person(s) Educated Patient    Methods Explanation;Handout    Comprehension Verbalized understanding            OT Short Term Goals - 04/20/20 1554      OT SHORT TERM GOAL #1   Status On-going      OT SHORT TERM GOAL #2  Status On-going      OT SHORT TERM GOAL #3   Status On-going      OT SHORT TERM GOAL #4   Status On-going         Group Session:   S: "I'm a big procrastinator, but sometimes I also feel this sense of obligation to do something in a specific time frame, like answer student emails."  O: Group began with a recap on strategies/tips learned from previous OT session focused on time management. Today's group continued to focus on topic of time management and reviewed additional strategies to manage and create schedules that are more efficient and effective in meeting their needs.  A: Kwynn was active and independent in her participation of discussion and activity, sharing that she struggles with boundaries and setting limits, especially as it relates to  her job performance and work load. She identified an internal struggle when it comes to answering or replying to student emails, noting that she has a policy in place, though sometimes procrastinates in answering. She appeared receptive to additional education and strategies providing, identifying intent to continue to give herself breaks. Pt identified cigarette breaks as something she has used in the past, though noted she no longer smokes and continues to look for alternative "breaks" to manage her time and de-stress effectively.  P: Plan to discharge patient tomorrow, Thursday, 05/04/20 from [facility] program with plan to follow-up with individual therapist and PCP.   Plan - 05/03/20 1512    Occupational performance deficits (Please refer to evaluation for details): ADL's;IADL's;Rest and Sleep;Education;Work;Leisure;Social Participation    Body Structure / Function / Physical Skills ADL    Cognitive Skills Attention;Consciousness;Emotional;Energy/Drive;Memory;Perception;Problem Solve;Safety Awareness;Temperament/Personality;Thought;Understand    Psychosocial Skills Coping Strategies;Environmental  Adaptations;Interpersonal Interaction;Routines and Behaviors;Habits           Patient will benefit from skilled therapeutic intervention in order to improve the following deficits and impairments:   Body Structure / Function / Physical Skills: ADL Cognitive Skills: Attention, Consciousness, Emotional, Energy/Drive, Memory, Perception, Problem Solve, Safety Awareness, Temperament/Personality, Thought, Understand Psychosocial Skills: Coping Strategies, Environmental  Adaptations, Interpersonal Interaction, Routines and Behaviors, Habits   Visit Diagnosis: Difficulty coping  Frontal lobe and executive function deficit  Bipolar II disorder [facility])    Problem List Patient Active Problem List   Diagnosis Date Noted  . Tobacco abuse 04/06/2020  . Hyperlipidemia 04/06/2020  . CAD (coronary artery  disease) 04/06/2020  . STEMI (ST elevation myocardial infarction) (HCC) 04/05/2020  . Depressive disorder 12/06/2016  . Type 1 diabetes mellitus (HCC) 11/04/2016    05/03/2020  Donne Hazel, MOT, OTR/L  05/03/2020, 3:13 PM  [facility] PARTIAL HOSPITALIZATION PROGRAM [address] [place], [place], [address] Phone: [phone]   Fax:  [phone]  Name: JINNIFER MONTEJANO MRN: [id number] Date of Birth: [birth year]

## 2020-05-04 ENCOUNTER — Other Ambulatory Visit (HOSPITAL_COMMUNITY): Payer: BC Managed Care – PPO | Admitting: Licensed Clinical Social Worker

## 2020-05-04 ENCOUNTER — Encounter (HOSPITAL_COMMUNITY): Payer: Self-pay

## 2020-05-04 ENCOUNTER — Other Ambulatory Visit (HOSPITAL_COMMUNITY): Payer: BC Managed Care – PPO | Admitting: Occupational Therapy

## 2020-05-04 ENCOUNTER — Other Ambulatory Visit: Payer: Self-pay

## 2020-05-04 DIAGNOSIS — Z79899 Other long term (current) drug therapy: Secondary | ICD-10-CM | POA: Diagnosis not present

## 2020-05-04 DIAGNOSIS — F411 Generalized anxiety disorder: Secondary | ICD-10-CM

## 2020-05-04 DIAGNOSIS — R41844 Frontal lobe and executive function deficit: Secondary | ICD-10-CM

## 2020-05-04 DIAGNOSIS — F419 Anxiety disorder, unspecified: Secondary | ICD-10-CM | POA: Diagnosis not present

## 2020-05-04 DIAGNOSIS — F332 Major depressive disorder, recurrent severe without psychotic features: Secondary | ICD-10-CM | POA: Diagnosis not present

## 2020-05-04 DIAGNOSIS — F3181 Bipolar II disorder: Secondary | ICD-10-CM

## 2020-05-04 DIAGNOSIS — R4589 Other symptoms and signs involving emotional state: Secondary | ICD-10-CM

## 2020-05-04 DIAGNOSIS — F439 Reaction to severe stress, unspecified: Secondary | ICD-10-CM | POA: Diagnosis not present

## 2020-05-04 NOTE — Progress Notes (Addendum)
Spiritual care group 05/03/2020 11:00 - 12:00 ? Group met via web-ex due to COVID-19 precautions.  Group facilitated by Simone Curia, MDiv, BCC  ? ? Group focused on topic of strength. ?Group members reflected on what thoughts and feelings emerge when they hear this topic. ?They then engaged in facilitated dialog around how strength is present in their lives. This dialog focused on representing what strength had been to them in their lives (images and patterns given) and what they saw as helpful in their life now (what they needed / wanted). ? ? Activity drew on narrative framework   Alicia Phillips was present throughout group.  Engaged with strength as "reminder that I am still here"   Spoke about feeling some nervousness / anxiety in her body due to episode with heart.  Described strategies for reminding herself of strength and offering her body acceptance.

## 2020-05-04 NOTE — Therapy (Signed)
[facility] PARTIAL HOSPITALIZATION PROGRAM [address] [place], [place], [address] Phone: [phone]   Fax:  [phone] Virtual Visit via Video Note  I connected with Alicia Phillips on 05/04/20 at  11:00 AM EDT by a video enabled telemedicine application and verified that I am speaking with the correct person using two identifiers.   I discussed the limitations of evaluation and management by telemedicine and the availability of in person appointments. The patient expressed understanding and agreed to proceed.   I discussed the assessment and treatment plan with the patient. The patient was provided an opportunity to ask questions and all were answered. The patient agreed with the plan and demonstrated an understanding of the instructions.   The patient was advised to call back or seek an in-person evaluation if the symptoms worsen or if the condition fails to improve as anticipated.  Location: Patient - Patient Home OT - Clinic Office   I provided 60 minutes of non-face-to-face time during this encounter.   Alicia Phillips, OT   Occupational Therapy Treatment  Patient Details  Name: Alicia Phillips MRN: [id number] Date of Birth: 07-17-1981 Referring Provider (OT): Hillery Jacks   Encounter Date: 05/04/2020   OT End of Session - 05/04/20 1223    Visit Number 11    Number of Visits 20    Date for OT Re-Evaluation 05/17/20    Authorization Type BCBS    Authorization Time Period no auth or visit limit    OT Start Time 1100    OT Stop Time 1200    OT Time Calculation (min) 60 min    Behavior During Therapy [facility] for tasks assessed/performed           Past Medical History:  Diagnosis Date  . Anxiety   . Bipolar disorder (HCC)   . CAD (coronary artery disease)    a. LHC 04/05/20: 95% stenosis of mid LAD s/p DES, 30% stenosis of proximal to mid LAD  . Diabetes mellitus type 1 (HCC)   . History of borderline personality disorder   .  Hyperlipidemia   . Major depression   . STEMI (ST elevation myocardial infarction) (HCC) 04/05/2020  . Tobacco use     Past Surgical History:  Procedure Laterality Date  . CORONARY STENT INTERVENTION N/A 04/05/2020   Procedure: CORONARY STENT INTERVENTION;  Surgeon: Iran Ouch, MD;  [facility];  Service: Cardiovascular;  Laterality: N/A;  . LEFT HEART CATH AND CORONARY ANGIOGRAPHY N/A 04/05/2020   Procedure: LEFT HEART CATH AND CORONARY ANGIOGRAPHY;  Surgeon: Iran Ouch, MD;  [facility]: [facility];  Service: Cardiovascular;  Laterality: N/A;    There were no vitals filed for this visit.   Subjective Assessment - 05/04/20 1223    Currently in Pain? 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 Group reviewed common themes/phrases we often say to ourselves to validate our procrastination and looked at strategies to combat this procrastination and lack of energy/motivation. Following discussion on procrastination, group members were introduced to goal-setting and identified specific goals or areas of improvement around the topic of goal-setting that they would like to address in tomorrow's session.     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 Pt identified interest in utilizing two specific procrastination strategies - visualizing the task to be done in order to gain momentum to do it, and remembering in the moment and doing it as remembered. Appeared receptive to additional strategies and education offered.  P: Continue to attend PHP OT group sessions 5x week for 1 weeks to promote daily structure, social engagement, and opportunities to develop and utilize adaptive strategies to maximize functional performance in preparation for safe transition and integration back into school, work, and the community. Plan to address topic of SMART goals in next OT group session.       Plan - 05/04/20 1223    Occupational performance deficits (Please refer to evaluation for details): ADL's;IADL's;Rest and Sleep;Education;Work;Leisure;Social Participation    Body Structure / Function / Physical Skills ADL    Cognitive Skills Attention;Consciousness;Emotional;Energy/Drive;Memory;Perception;Problem Solve;Safety Awareness;Temperament/Personality;Thought;Understand    Psychosocial Skills Coping Strategies;Environmental  Adaptations;Interpersonal Interaction;Routines and Behaviors;Habits           Patient will benefit from skilled therapeutic intervention in order to improve the following deficits and impairments:   Body Structure / Function / Physical Skills: ADL Cognitive Skills: Attention, Consciousness, Emotional, Energy/Drive, Memory, Perception, Problem Solve, Safety Awareness, Temperament/Personality, Thought, Understand Psychosocial Skills: Coping Strategies, Environmental  Adaptations, Interpersonal Interaction, Routines and Behaviors, Habits   Visit  Diagnosis: Difficulty coping  Frontal lobe and executive function deficit  Bipolar II disorder [facility])    Problem List Patient Active Problem List   Diagnosis Date Noted  . Tobacco abuse 04/06/2020  . Hyperlipidemia 04/06/2020  . CAD (coronary artery disease) 04/06/2020  . STEMI (ST elevation myocardial infarction) (HCC) 04/05/2020  . Depressive disorder 12/06/2016  . Type 1 diabetes mellitus (HCC) 11/04/2016    05/04/2020  Alicia Phillips, MOT, OTR/L  05/04/2020, 12:24 PM  [facility] PARTIAL HOSPITALIZATION PROGRAM [address] [place], [place], [address] Phone: [phone]   Fax:  [phone]  Name: Alicia Phillips MRN: [id number] Date of Birth: 05-25-1981

## 2020-05-05 ENCOUNTER — Other Ambulatory Visit (HOSPITAL_COMMUNITY): Payer: BC Managed Care – PPO

## 2020-05-05 ENCOUNTER — Other Ambulatory Visit: Payer: Self-pay

## 2020-05-05 ENCOUNTER — Encounter: Payer: Self-pay | Admitting: Cardiovascular Disease

## 2020-05-05 ENCOUNTER — Ambulatory Visit: Payer: BC Managed Care – PPO | Admitting: Cardiovascular Disease

## 2020-05-05 ENCOUNTER — Encounter: Payer: BC Managed Care – PPO | Attending: Cardiovascular Disease | Admitting: *Deleted

## 2020-05-05 ENCOUNTER — Ambulatory Visit (HOSPITAL_COMMUNITY): Payer: BC Managed Care – PPO

## 2020-05-05 VITALS — BP 114/76 | HR 89 | Ht 67.0 in | Wt 162.4 lb

## 2020-05-05 DIAGNOSIS — Z720 Tobacco use: Secondary | ICD-10-CM | POA: Diagnosis not present

## 2020-05-05 DIAGNOSIS — Z87891 Personal history of nicotine dependence: Secondary | ICD-10-CM | POA: Insufficient documentation

## 2020-05-05 DIAGNOSIS — E782 Mixed hyperlipidemia: Secondary | ICD-10-CM

## 2020-05-05 DIAGNOSIS — Z955 Presence of coronary angioplasty implant and graft: Secondary | ICD-10-CM | POA: Insufficient documentation

## 2020-05-05 DIAGNOSIS — I2102 ST elevation (STEMI) myocardial infarction involving left anterior descending coronary artery: Secondary | ICD-10-CM | POA: Diagnosis not present

## 2020-05-05 DIAGNOSIS — I214 Non-ST elevation (NSTEMI) myocardial infarction: Secondary | ICD-10-CM | POA: Insufficient documentation

## 2020-05-05 MED ORDER — TICAGRELOR 90 MG PO TABS
90.0000 mg | ORAL_TABLET | Freq: Two times a day (BID) | ORAL | 11 refills | Status: DC
Start: 2020-05-05 — End: 2021-04-20

## 2020-05-05 MED ORDER — ATORVASTATIN CALCIUM 80 MG PO TABS
80.0000 mg | ORAL_TABLET | Freq: Every day | ORAL | 3 refills | Status: DC
Start: 2020-05-05 — End: 2021-04-27

## 2020-05-05 NOTE — Assessment & Plan Note (Signed)
History of hyperlipidemia on high-dose statin therapy with lipid profile performed 04/06/2020 revealing total cholesterol of 07/30/1991, LDL of 124 and HDL of 50.  We will recheck a lipid liver profile in 2 months.

## 2020-05-05 NOTE — Progress Notes (Signed)
05/05/2020 Alicia Phillips   [birth year]  [id number]  Primary Physician Aliene Beams, MD Primary Cardiologist: Runell Gess MD FACP, [place], [place], [place]  HPI:  Alicia Phillips is a 39 y.o. mildly overweight Caucasian female with a history of type 1 diabetes since 1986 tobacco abuse who presented on 04/05/2020 with an anterior non-STEMI.  She underwent diagnostic coronary angiography the same day by Dr. Kirke Corin found a 95% mid LAD lesion that was stented with a 2.25 mm x 18 mm long resolute Onyx drug-eluting stent.  She had no other significant CAD.  LV function was normal.  Since that time she has stopped smoking.  She did have elevated lipids during that admit admission but was discharged home on high-dose statin therapy in addition to dual antiplatelet therapy including low-dose aspirin and Brilinta twice daily.  She was seen in the ER on 05/02/2020 with palpitations which she thought was PVCs and were similar to her presenting symptoms although there was no objective evidence of this and she was sent home on double the beta-blocker dose.  She has felt well since.   Current Meds  Medication Sig  . ACETAMINOPHEN EXTRA STRENGTH 500 MG tablet Take 500 mg by mouth every 4 (four) hours as needed for mild pain or headache.   . ARIPiprazole (ABILIFY) 5 MG tablet Take 1 tablet (5 mg total) by mouth daily.  Marland Kitchen aspirin 81 MG chewable tablet Chew 1 tablet (81 mg total) by mouth daily.  Marland Kitchen atorvastatin (LIPITOR) 80 MG tablet Take 1 tablet (80 mg total) by mouth daily.  . Continuous Blood Gluc Sensor (FREESTYLE LIBRE 14 DAY SENSOR) MISC Apply topically as directed.  . doxycycline (VIBRAMYCIN) 50 MG capsule Take 1 capsule (50 mg total) by mouth daily.  . hydrOXYzine (ATARAX/VISTARIL) 10 MG tablet Take 1 tablet (10 mg total) by mouth every 4 (four) hours as needed for anxiety.  Alicia Phillips 100-PINK-NOVO DEVI 3 (three) times daily.  . insulin aspart (NOVOLOG) 100 UNIT/ML injection Inject 0-20 Units into the  skin 3 (three) times daily before meals. Per sliding scale  . lamoTRIgine (LAMICTAL) 100 MG tablet Take 1 tablet (100 mg total) by mouth at bedtime. Take one tablet at bedtime.  . nitroGLYCERIN (NITROSTAT) 0.4 MG SL tablet Place 1 tablet (0.4 mg total) under the tongue every 5 (five) minutes as needed for chest pain.  Marland Kitchen NOVOFINE PEN NEEDLE 32G X 6 MM MISC SMARTSIG:Injection 5 Times Daily  . ticagrelor (BRILINTA) 90 MG TABS tablet Take 1 tablet (90 mg total) by mouth 2 (two) times daily.  Alicia Phillips FLEXTOUCH 100 UNIT/ML FlexTouch Pen Inject 25 Units into the skin daily.   . [DISCONTINUED] ticagrelor (BRILINTA) 90 MG TABS tablet Take 1 tablet (90 mg total) by mouth 2 (two) times daily.   Current Facility-Administered Medications for the 05/05/20 encounter (Office Visit) with Runell Gess, MD  Medication  . iron polysaccharides (NIFEREX) capsule 150 mg     Allergies  Allergen Reactions  . Sulfamethoxazole-Trimethoprim Rash    Social History   Socioeconomic History  . Marital status: Single    Spouse name: Not on file  . Number of children: Not on file  . Years of education: Not on file  . Highest education level: Not on file  Occupational History  . Not on file  Tobacco Use  . Smoking status: Former Games developer  . Smokeless tobacco: Never Used  . Tobacco comment: quit after  heartattack Sept 2021  Vaping Use  .  Vaping Use: Never used  Substance and Sexual Activity  . Alcohol use: Never  . Drug use: Never  . Sexual activity: Not on file  Other Topics Concern  . Not on file  Social History Narrative  . Not on file   Social Determinants of Health   Financial Resource Strain:   . Difficulty of Paying Living Expenses: Not on file  Food Insecurity:   . Worried About Programme researcher, broadcasting/film/video in the Last Year: Not on file  . Ran Out of Food in the Last Year: Not on file  Transportation Needs:   . Lack of Transportation (Medical): Not on file  . Lack of Transportation (Non-Medical):  Not on file  Physical Activity:   . Days of Exercise per Week: Not on file  . Minutes of Exercise per Session: Not on file  Stress:   . Feeling of Stress : Not on file  Social Connections:   . Frequency of Communication with Friends and Family: Not on file  . Frequency of Social Gatherings with Friends and Family: Not on file  . Attends Religious Services: Not on file  . Active Member of Clubs or Organizations: Not on file  . Attends Banker Meetings: Not on file  . Marital Status: Not on file  Intimate Partner Violence:   . Fear of Current or Ex-Partner: Not on file  . Emotionally Abused: Not on file  . Physically Abused: Not on file  . Sexually Abused: Not on file     Review of Systems: General: negative for chills, fever, night sweats or weight changes.  Cardiovascular: negative for chest pain, dyspnea on exertion, edema, orthopnea, palpitations, paroxysmal nocturnal dyspnea or shortness of breath Dermatological: negative for rash Respiratory: negative for cough or wheezing Urologic: negative for hematuria Abdominal: negative for nausea, vomiting, diarrhea, bright red blood per rectum, melena, or hematemesis Neurologic: negative for visual changes, syncope, or dizziness All other systems reviewed and are otherwise negative except as noted above.    Blood pressure 114/76, pulse 89, height 5\' 7"  (1.702 m), weight 162 lb 6.4 oz (73.7 kg), last menstrual period 04/29/2020, SpO2 99 %.  General appearance: alert and no distress Neck: no adenopathy, no carotid bruit, no JVD, supple, symmetrical, trachea midline and thyroid not enlarged, symmetric, no tenderness/mass/nodules Lungs: clear to auscultation bilaterally Heart: regular rate and rhythm, S1, S2 normal, no murmur, click, rub or gallop Extremities: extremities normal, atraumatic, no cyanosis or edema Pulses: 2+ and symmetric Skin: Skin color, texture, turgor normal. No rashes or lesions Neurologic: Alert and  oriented X 3, normal strength and tone. Normal symmetric reflexes. Normal coordination and gait  EKG not performed today  ASSESSMENT AND PLAN:   STEMI (ST elevation myocardial infarction) (HCC) History of anterior non-STEMI 04/05/2020.  She underwent cardiac catheterization same day by Dr. 06/05/2020 via the right radial approach revealing a 95% mid LAD lesion which was stented using a resolute Onyx 2.25 x 18 mm long drug-eluting stent.  She had no other significant CAD.  Her LV function was normal.  She was discharged home the following day on aspirin and Brilinta.  She was recently seen in the ER by Dr. Kirke Corin on 05/02/2020 with palpitations which were part of her presenting symptoms.  Her beta-blocker was doubled and she is had no recurrent symptoms.  Tobacco abuse Discontinued at the time of her MI  Hyperlipidemia History of hyperlipidemia on high-dose statin therapy with lipid profile performed 04/06/2020 revealing total cholesterol of 07/30/1991, LDL  of 124 and HDL of 50.  We will recheck a lipid liver profile in 2 months.      Runell Gess MD FACP,FACC,FAHA, [facility] 05/05/2020 3:36 PM

## 2020-05-05 NOTE — Patient Instructions (Signed)
  Lab Work:  Your physician recommends that you return for lab work in: 2 MONTHS-FASTING  If you have labs (blood work) drawn today and your tests are completely normal, you will receive your results only by: Marland Kitchen MyChart Message (if you have MyChart) OR . A paper copy in the mail If you have any lab test that is abnormal or we need to change your treatment, we will call you to review the results.   Follow-Up: At [facility], you and your health needs are our priority.  As part of our continuing mission to provide you with exceptional heart care, we have created designated Provider Care Teams.  These Care Teams include your primary Cardiologist (physician) and Advanced Practice Providers (APPs -  Physician Assistants and Nurse Practitioners) who all work together to provide you with the care you need, when you need it.  We recommend signing up for the patient portal called "MyChart".  Sign up information is provided on this After Visit Summary.  MyChart is used to connect with patients for Virtual Visits (Telemedicine).  Patients are able to view lab/test results, encounter notes, upcoming appointments, etc.  Non-urgent messages can be sent to your provider as well.   To learn more about what you can do with MyChart, go to [email or web address].    Your next appointment:   3 month(s)  The format for your next appointment:   In Person  Provider:   Nanetta Batty, MD

## 2020-05-05 NOTE — Assessment & Plan Note (Signed)
Discontinued at the time of her MI

## 2020-05-05 NOTE — Progress Notes (Signed)
Initial telephone orientation completed. Diagnosis can be found in [facility] 9/8.EP orientation scheduled for 10/14 at 2pm

## 2020-05-05 NOTE — Assessment & Plan Note (Signed)
History of anterior non-STEMI 04/05/2020.  She underwent cardiac catheterization same day by Dr. Kirke Corin via the right radial approach revealing a 95% mid LAD lesion which was stented using a resolute Onyx 2.25 x 18 mm long drug-eluting stent.  She had no other significant CAD.  Her LV function was normal.  She was discharged home the following day on aspirin and Brilinta.  She was recently seen in the ER by Dr. Preston Fleeting on 05/02/2020 with palpitations which were part of her presenting symptoms.  Her beta-blocker was doubled and she is had no recurrent symptoms.

## 2020-05-08 ENCOUNTER — Other Ambulatory Visit (HOSPITAL_COMMUNITY): Payer: BC Managed Care – PPO

## 2020-05-08 ENCOUNTER — Ambulatory Visit (HOSPITAL_COMMUNITY): Payer: BC Managed Care – PPO

## 2020-05-08 ENCOUNTER — Other Ambulatory Visit (HOSPITAL_COMMUNITY): Payer: BC Managed Care – PPO | Admitting: Licensed Clinical Social Worker

## 2020-05-08 ENCOUNTER — Telehealth (HOSPITAL_COMMUNITY): Payer: Self-pay | Admitting: Psychiatry

## 2020-05-08 ENCOUNTER — Other Ambulatory Visit: Payer: Self-pay

## 2020-05-08 DIAGNOSIS — F332 Major depressive disorder, recurrent severe without psychotic features: Secondary | ICD-10-CM

## 2020-05-08 DIAGNOSIS — Z79899 Other long term (current) drug therapy: Secondary | ICD-10-CM | POA: Diagnosis not present

## 2020-05-08 DIAGNOSIS — F419 Anxiety disorder, unspecified: Secondary | ICD-10-CM | POA: Diagnosis not present

## 2020-05-08 DIAGNOSIS — F411 Generalized anxiety disorder: Secondary | ICD-10-CM

## 2020-05-08 DIAGNOSIS — F439 Reaction to severe stress, unspecified: Secondary | ICD-10-CM | POA: Diagnosis not present

## 2020-05-08 NOTE — Telephone Encounter (Signed)
D:  Patient will transition from PHP to MH-IOP on 05-15-20.  A:  Placed call and oriented patient.  Answered all her questions.  Encouraged pt to verify her insurance benefits for IOP.  R:  Patient receptive.

## 2020-05-09 ENCOUNTER — Encounter (HOSPITAL_COMMUNITY): Payer: Self-pay

## 2020-05-09 ENCOUNTER — Ambulatory Visit (HOSPITAL_COMMUNITY): Payer: BC Managed Care – PPO

## 2020-05-09 ENCOUNTER — Other Ambulatory Visit: Payer: Self-pay

## 2020-05-09 ENCOUNTER — Other Ambulatory Visit (HOSPITAL_COMMUNITY): Payer: BC Managed Care – PPO | Admitting: Licensed Clinical Social Worker

## 2020-05-09 ENCOUNTER — Other Ambulatory Visit (HOSPITAL_COMMUNITY): Payer: BC Managed Care – PPO | Admitting: Occupational Therapy

## 2020-05-09 ENCOUNTER — Other Ambulatory Visit (HOSPITAL_COMMUNITY): Payer: BC Managed Care – PPO

## 2020-05-09 DIAGNOSIS — F439 Reaction to severe stress, unspecified: Secondary | ICD-10-CM | POA: Diagnosis not present

## 2020-05-09 DIAGNOSIS — Z79899 Other long term (current) drug therapy: Secondary | ICD-10-CM | POA: Diagnosis not present

## 2020-05-09 DIAGNOSIS — F3181 Bipolar II disorder: Secondary | ICD-10-CM

## 2020-05-09 DIAGNOSIS — F411 Generalized anxiety disorder: Secondary | ICD-10-CM

## 2020-05-09 DIAGNOSIS — R4589 Other symptoms and signs involving emotional state: Secondary | ICD-10-CM

## 2020-05-09 DIAGNOSIS — R41844 Frontal lobe and executive function deficit: Secondary | ICD-10-CM

## 2020-05-09 DIAGNOSIS — F332 Major depressive disorder, recurrent severe without psychotic features: Secondary | ICD-10-CM

## 2020-05-09 DIAGNOSIS — F419 Anxiety disorder, unspecified: Secondary | ICD-10-CM | POA: Diagnosis not present

## 2020-05-09 NOTE — Progress Notes (Signed)
Spoke with patient via Webex video call, used 2 identifiers to correctly identify patient. Groups are going well she states that she loves them. She did have a concern about her tongue feeling thick in the mornings and evenings after she takes her Abilify. It does not affect her swallowing or eating but she notices that she has to concentrate to articulate her words. She calls it "lazy tongue." She did speak with the NP about it and will continue to monitor for any changes. The Abilify helps her with improved mood, less crying spells, and help with anxiety. It helps more than any medication in the past and she does not want to discontinue it at this time. On scale 1-10 as 10 being worst she rates depression at 4 and anxiety at 7. She did have a lot of anxiety Sunday but was able to use her newly acquired coping skills. Denies SI/HI or AV hallucinations. PHQ9=10. She is being discharged this week and will be attending IOP next week. No issue or complaints. Looking forward to continuing treatment.

## 2020-05-09 NOTE — Therapy (Signed)
[facility] PARTIAL HOSPITALIZATION PROGRAM [address] [place], [place], [address] Phone: [phone]   Fax:  [phone] Virtual Visit via Video Note  I connected with [name] on 05/09/20 at  11:00 AM EDT by a video enabled telemedicine application and verified that I am speaking with the correct person using two identifiers.   I discussed the limitations of evaluation and management by telemedicine and the availability of in person appointments. The patient expressed understanding and agreed to proceed.  I discussed the assessment and treatment plan with the patient. The patient was provided an opportunity to ask questions and all were answered. The patient agreed with the plan and demonstrated an understanding of the instructions.   The patient was advised to call back or seek an in-person evaluation if the symptoms worsen or if the condition fails to improve as anticipated.  Location: Patient - Patient Home OT - Clinic Office   I provided 60 minutes of non-face-to-face time during this encounter.   Donne Hazel, OT   Occupational Therapy Treatment  Patient Details  Name: Alicia Phillips MRN: [id number] Date of Birth: 09-27-1980 Referring Provider (OT): Hillery Jacks   Encounter Date: 05/09/2020   OT End of Session - 05/09/20 1211    Visit Number 12    Number of Visits 20    Date for OT Re-Evaluation 05/17/20    Authorization Type BCBS    Authorization Time Period no auth or visit limit    OT Start Time 1105    OT Stop Time 1205    OT Time Calculation (min) 60 min    Activity Tolerance Patient tolerated treatment well    Behavior During Therapy WFL for tasks assessed/performed           Past Medical History:  Diagnosis Date  . Anxiety   . Bipolar disorder (HCC)   . CAD (coronary artery disease)    a. LHC 04/05/20: 95% stenosis of mid LAD s/p DES, 30% stenosis of proximal to mid LAD  . Diabetes mellitus type 1 (HCC)   .  History of borderline personality disorder   . Hyperlipidemia   . Major depression   . STEMI (ST elevation myocardial infarction) (HCC) 04/05/2020  . Tobacco use     Past Surgical History:  Procedure Laterality Date  . CORONARY STENT INTERVENTION N/A 04/05/2020   Procedure: CORONARY STENT INTERVENTION;  Surgeon: Iran Ouch, MD;  Location: [facility] INVASIVE CV LAB;  Service: Cardiovascular;  Laterality: N/A;  . LEFT HEART CATH AND CORONARY ANGIOGRAPHY N/A 04/05/2020   Procedure: LEFT HEART CATH AND CORONARY ANGIOGRAPHY;  Surgeon: Iran Ouch, MD;  Location: [facility] INVASIVE CV LAB;  Service: Cardiovascular;  Laterality: N/A;    There were no vitals filed for this visit.   Subjective Assessment - 05/09/20 1211    Currently in Pain? No/denies                                OT Education - 05/09/20 1211    Education Details Educated on the 5 F's and provided resources/strategies/tips to improve overall health and wellness    Person(s) Educated Patient    Methods Explanation;Handout    Comprehension Verbalized understanding            OT Short Term Goals - 04/20/20 1554      OT SHORT TERM GOAL #1   Status On-going  OT SHORT TERM GOAL #2   Status On-going      OT SHORT TERM GOAL #3   Status On-going      OT SHORT TERM GOAL #4   Status On-going         Group Session:  S: "My last medication had me gaining weight, but since I switched, I've noticed less of an appetite and losing 20 lbs over the last 4 months."  O: Today's group session focused on the topic of health and wellness as it relates to the impact on mental health. Discussion focused on identifying the 5 F's to wellness including Food, Fitness, Fresh air, Fellowship, and Friendship with self and soul. Group members identified areas of wellness that they would like to improve upon and were educated and offered various resources. Discussion also focused on how the food we eat impacts our  mental health, along with the benefits of engaging in physical activity/exercise and getting outside for fresh air. Discussion wrapped up with group members identifying one area of wellness they could improve upon and identified a strategy to do so.    A: Jaycee was active and animated in her participation of discussion, appeared pleasant and in good spirits, motivated. She identified difficulty with her appetite and medications, noting that she has less of an appetite and was looking for strategies to incorporate more meals or nutrition into her routine. Pt identified interest in the idea of drinking smoothies or having smaller meals throughout the day instead of three large ones. Pt also shared that something she has practiced in the past was soup for breakfast, as this is one of the more challenging meal times, though she identifies importance and need to get 'something' into her stomach to start the day, besides coffee alone. Receptive to alternative strategies and education provided.   P: Plan to discharge patient on Friday, 05/12/20, with plan to attend MH IOP on Monday 05/15/20   Plan - 05/09/20 1211    Occupational performance deficits (Please refer to evaluation for details): ADL's;IADL's;Rest and Sleep;Education;Work;Leisure;Social Participation    Body Structure / Function / Physical Skills ADL    Cognitive Skills Attention;Consciousness;Emotional;Energy/Drive;Memory;Perception;Problem Solve;Safety Awareness;Temperament/Personality;Thought;Understand    Psychosocial Skills Coping Strategies;Environmental  Adaptations;Interpersonal Interaction;Routines and Behaviors;Habits           Patient will benefit from skilled therapeutic intervention in order to improve the following deficits and impairments:   Body Structure / Function / Physical Skills: ADL Cognitive Skills: Attention, Consciousness, Emotional, Energy/Drive, Memory, Perception, Problem Solve, Safety Awareness,  Temperament/Personality, Thought, Understand Psychosocial Skills: Coping Strategies, Environmental  Adaptations, Interpersonal Interaction, Routines and Behaviors, Habits   Visit Diagnosis: Difficulty coping  Frontal lobe and executive function deficit  Bipolar II disorder [facility])    Problem List Patient Active Problem List   Diagnosis Date Noted  . Tobacco abuse 04/06/2020  . Hyperlipidemia 04/06/2020  . CAD (coronary artery disease) 04/06/2020  . STEMI (ST elevation myocardial infarction) (HCC) 04/05/2020  . Depressive disorder 12/06/2016  . Type 1 diabetes mellitus (HCC) 11/04/2016   05/09/2020  Donne Hazel, MOT, OTR/L  05/09/2020, 12:12 PM  [facility] HOSPITALIZATION PROGRAM [address] [place], [place], [address] Phone: [phone]   Fax:  [phone]  Name: TORRIN FREIN MRN: [id number] Date of Birth: 03-07-1981

## 2020-05-10 ENCOUNTER — Ambulatory Visit (HOSPITAL_COMMUNITY): Payer: BC Managed Care – PPO

## 2020-05-10 ENCOUNTER — Telehealth: Payer: Self-pay | Admitting: Cardiovascular Disease

## 2020-05-10 ENCOUNTER — Other Ambulatory Visit (HOSPITAL_COMMUNITY): Payer: BC Managed Care – PPO | Admitting: Occupational Therapy

## 2020-05-10 ENCOUNTER — Other Ambulatory Visit (HOSPITAL_COMMUNITY): Payer: BC Managed Care – PPO

## 2020-05-10 ENCOUNTER — Other Ambulatory Visit (HOSPITAL_COMMUNITY): Payer: BC Managed Care – PPO | Admitting: Licensed Clinical Social Worker

## 2020-05-10 ENCOUNTER — Other Ambulatory Visit: Payer: Self-pay

## 2020-05-10 ENCOUNTER — Encounter (HOSPITAL_COMMUNITY): Payer: Self-pay

## 2020-05-10 DIAGNOSIS — F3181 Bipolar II disorder: Secondary | ICD-10-CM

## 2020-05-10 DIAGNOSIS — Z79899 Other long term (current) drug therapy: Secondary | ICD-10-CM | POA: Diagnosis not present

## 2020-05-10 DIAGNOSIS — F332 Major depressive disorder, recurrent severe without psychotic features: Secondary | ICD-10-CM | POA: Diagnosis not present

## 2020-05-10 DIAGNOSIS — R4589 Other symptoms and signs involving emotional state: Secondary | ICD-10-CM

## 2020-05-10 DIAGNOSIS — F411 Generalized anxiety disorder: Secondary | ICD-10-CM

## 2020-05-10 DIAGNOSIS — R41844 Frontal lobe and executive function deficit: Secondary | ICD-10-CM

## 2020-05-10 DIAGNOSIS — F439 Reaction to severe stress, unspecified: Secondary | ICD-10-CM | POA: Diagnosis not present

## 2020-05-10 DIAGNOSIS — F419 Anxiety disorder, unspecified: Secondary | ICD-10-CM | POA: Diagnosis not present

## 2020-05-10 NOTE — Therapy (Signed)
[facility] PARTIAL HOSPITALIZATION PROGRAM [address] [place], [place], [address] Phone: [phone]   Fax:  [phone] Virtual Visit via Video Note  I connected with Alicia Phillips on 05/10/20 at  12:00 PM EDT by a video enabled telemedicine application and verified that I am speaking with the correct person using two identifiers.   I discussed the limitations of evaluation and management by telemedicine and the availability of in person appointments. The patient expressed understanding and agreed to proceed.    I discussed the assessment and treatment plan with the patient. The patient was provided an opportunity to ask questions and all were answered. The patient agreed with the plan and demonstrated an understanding of the instructions.   The patient was advised to call back or seek an in-person evaluation if the symptoms worsen or if the condition fails to improve as anticipated.  Location: Patient - Patient Home OT - Clinic Office   I provided 50 minutes of non-face-to-face time during this encounter.   Donne Hazel, OT   Occupational Therapy Treatment  Patient Details  Name: Alicia Phillips MRN: [id number] Date of Birth: 02-Oct-1980 Referring Provider (OT): Hillery Jacks   Encounter Date: 05/10/2020   OT End of Session - 05/10/20 1438    Visit Number 13    Number of Visits 20    Date for OT Re-Evaluation 05/17/20    Authorization Type BCBS    Authorization Time Period no auth or visit limit    OT Start Time 1200    OT Stop Time 1250    OT Time Calculation (min) 50 min    Activity Tolerance Patient tolerated treatment well    Behavior During Therapy WFL for tasks assessed/performed           Past Medical History:  Diagnosis Date  . Anxiety   . Bipolar disorder (HCC)   . CAD (coronary artery disease)    a. LHC 04/05/20: 95% stenosis of mid LAD s/p DES, 30% stenosis of proximal to mid LAD  . Diabetes mellitus type 1 (HCC)    . History of borderline personality disorder   . Hyperlipidemia   . Major depression   . STEMI (ST elevation myocardial infarction) (HCC) 04/05/2020  . Tobacco use     Past Surgical History:  Procedure Laterality Date  . CORONARY STENT INTERVENTION N/A 04/05/2020   Procedure: CORONARY STENT INTERVENTION;  Surgeon: Iran Ouch, MD;  Location: [facility] INVASIVE CV LAB;  Service: Cardiovascular;  Laterality: N/A;  . LEFT HEART CATH AND CORONARY ANGIOGRAPHY N/A 04/05/2020   Procedure: LEFT HEART CATH AND CORONARY ANGIOGRAPHY;  Surgeon: Iran Ouch, MD;  Location: [facility] INVASIVE CV LAB;  Service: Cardiovascular;  Laterality: N/A;    There were no vitals filed for this visit.   Subjective Assessment - 05/10/20 1437    Currently in Pain? No/denies             OT Education - 05/10/20 1437    Education Details Educated on fight-or-flight response and use of relaxation strategies including deep breathing, guided imagery, and PMR    Person(s) Educated Patient    Methods Explanation;Handout;Demonstration    Comprehension Verbalized understanding;Returned demonstration            OT Short Term Goals - 04/20/20 1554      OT SHORT TERM GOAL #1   Status On-going      OT SHORT TERM GOAL #2   Status On-going  OT SHORT TERM GOAL #3   Status On-going      OT SHORT TERM GOAL #4   Status On-going         Group Session:  S: "I really like the idea of it being in my control, the breathing, the muscle tension."  O: Today's discussion focused on the topic of RELAXATION and patients reviewed and engaged in a variety of relaxation strategies techniques including deep breathing, guided imagery/meditation, and progressive muscle relaxation. After each strategy was reviewed, group members were invited to engage in an active practice of relaxation strategies identified. Review and background on the fight-or-flight response was also provided.  A: Alicia Phillips was active and engaged in  discussion, identifying benefit to discussing and practicing relaxation, as spirituality group was a "heavy" topic focused on grief. Pt shared past experiences with breathing techniques and yoga, however was excited to learn additional breathing strategies and progressive muscle relaxation. Pt identified challenge for herself to complete this afternoon, download the Calm app and practice guided meditation, as well as practice Zentangle mindful doodling. Pt receptive to education provided on additional relaxation strategies and techniques.   P: Continue to attend PHP OT group sessions 5x week for the remainder of the week to promote daily structure, social engagement, and opportunities to develop and utilize adaptive strategies to maximize functional performance in preparation for safe transition and integration back into school, work, and the community.     Plan - 05/10/20 1438    Occupational performance deficits (Please refer to evaluation for details): ADL's;IADL's;Rest and Sleep;Education;Work;Leisure;Social Participation    Body Structure / Function / Physical Skills ADL    Cognitive Skills Attention;Consciousness;Emotional;Energy/Drive;Memory;Perception;Problem Solve;Safety Awareness;Temperament/Personality;Thought;Understand    Psychosocial Skills Coping Strategies;Environmental  Adaptations;Interpersonal Interaction;Routines and Behaviors;Habits           Patient will benefit from skilled therapeutic intervention in order to improve the following deficits and impairments:   Body Structure / Function / Physical Skills: ADL Cognitive Skills: Attention, Consciousness, Emotional, Energy/Drive, Memory, Perception, Problem Solve, Safety Awareness, Temperament/Personality, Thought, Understand Psychosocial Skills: Coping Strategies, Environmental  Adaptations, Interpersonal Interaction, Routines and Behaviors, Habits   Visit Diagnosis: Difficulty coping  Frontal lobe and executive function  deficit  Bipolar II disorder [facility])    Problem List Patient Active Problem List   Diagnosis Date Noted  . Tobacco abuse 04/06/2020  . Hyperlipidemia 04/06/2020  . CAD (coronary artery disease) 04/06/2020  . STEMI (ST elevation myocardial infarction) (HCC) 04/05/2020  . Depressive disorder 12/06/2016  . Type 1 diabetes mellitus (HCC) 11/04/2016    05/10/2020  Donne Hazel, MOT, OTR/L  05/10/2020, 2:38 PM  [facility] PARTIAL HOSPITALIZATION PROGRAM [address] [place], [place], [address] Phone: [phone]   Fax:  [phone]  Name: Alicia Phillips MRN: [id number] Date of Birth: 1981-04-28

## 2020-05-10 NOTE — Telephone Encounter (Signed)
Pt c/o BP issue: STAT if pt c/o blurred vision, one-sided weakness or slurred speech  1. What are your last 5 BP readings? 103/81; 81; 110/83; 83; 108/80; 88; 107/81; 85  2. Are you having any other symptoms (ex. Dizziness, headache, blurred vision, passed out)? No  3. What is your BP issue? Bottom number higher than normal. Said that Dr. Allyson Sabal wants bottom number to be around 70

## 2020-05-10 NOTE — Telephone Encounter (Signed)
Informed pt that this is OK. She was very anxious about this number being over 80 like Dr Allyson Sabal said. Informed pt that I would forward to Dr Allyson Sabal for his review and further direction. Expresses thanks. Please advise

## 2020-05-10 NOTE — Progress Notes (Signed)
Spiritual care group 05/10/2020 11:00-12:00. Facilitated by Wilkie Aye, MDiv.   Group occurred via Web-ex meeting set up by [facility] CSW due to precautions around COVID-19.   Group topic: Grief and Loss, support   Alicia Phillips was present throughout group.  Engaged with topic, processing experiences of grief journey related to changes in life.  Engaged in framing grief experience, reflecting on coping, support, and needs in grief journey.      Burnis Kingfisher, MDiv. [facility] Lead Clinical Chaplain  [facility]  [facility]

## 2020-05-11 ENCOUNTER — Other Ambulatory Visit: Payer: Self-pay

## 2020-05-11 ENCOUNTER — Encounter (HOSPITAL_COMMUNITY): Payer: Self-pay

## 2020-05-11 ENCOUNTER — Other Ambulatory Visit (HOSPITAL_COMMUNITY): Payer: BC Managed Care – PPO | Admitting: Occupational Therapy

## 2020-05-11 ENCOUNTER — Ambulatory Visit (HOSPITAL_COMMUNITY): Payer: BC Managed Care – PPO

## 2020-05-11 ENCOUNTER — Other Ambulatory Visit (HOSPITAL_COMMUNITY): Payer: BC Managed Care – PPO | Admitting: Licensed Clinical Social Worker

## 2020-05-11 ENCOUNTER — Other Ambulatory Visit (HOSPITAL_COMMUNITY): Payer: BC Managed Care – PPO

## 2020-05-11 VITALS — Ht 67.5 in | Wt 165.0 lb

## 2020-05-11 DIAGNOSIS — Z79899 Other long term (current) drug therapy: Secondary | ICD-10-CM | POA: Diagnosis not present

## 2020-05-11 DIAGNOSIS — Z955 Presence of coronary angioplasty implant and graft: Secondary | ICD-10-CM | POA: Diagnosis not present

## 2020-05-11 DIAGNOSIS — F419 Anxiety disorder, unspecified: Secondary | ICD-10-CM | POA: Diagnosis not present

## 2020-05-11 DIAGNOSIS — F332 Major depressive disorder, recurrent severe without psychotic features: Secondary | ICD-10-CM | POA: Diagnosis not present

## 2020-05-11 DIAGNOSIS — R41844 Frontal lobe and executive function deficit: Secondary | ICD-10-CM

## 2020-05-11 DIAGNOSIS — R4589 Other symptoms and signs involving emotional state: Secondary | ICD-10-CM

## 2020-05-11 DIAGNOSIS — Z87891 Personal history of nicotine dependence: Secondary | ICD-10-CM | POA: Diagnosis not present

## 2020-05-11 DIAGNOSIS — I214 Non-ST elevation (NSTEMI) myocardial infarction: Secondary | ICD-10-CM | POA: Diagnosis not present

## 2020-05-11 DIAGNOSIS — F439 Reaction to severe stress, unspecified: Secondary | ICD-10-CM | POA: Diagnosis not present

## 2020-05-11 DIAGNOSIS — F3181 Bipolar II disorder: Secondary | ICD-10-CM

## 2020-05-11 DIAGNOSIS — F411 Generalized anxiety disorder: Secondary | ICD-10-CM

## 2020-05-11 NOTE — Progress Notes (Signed)
Cardiac Individual Treatment Plan  Patient Details  Name: Alicia Phillips MRN: [id number] Date of Birth: Apr 02, 1981 Referring Provider:    Initial Encounter Date:    Cardiac Rehab from 05/11/2020 in [facility] Cardiac and Pulmonary Rehab  Date 05/11/20      Visit Diagnosis: NSTEMI (non-ST elevation myocardial infarction) [facility])  Status post coronary artery stent placement  Patient's Home Medications on Admission:  Current Outpatient Medications:  .  ACETAMINOPHEN EXTRA STRENGTH 500 MG tablet, Take 500 mg by mouth every 4 (four) hours as needed for mild pain or headache. , Disp: , Rfl:  .  ARIPiprazole (ABILIFY) 5 MG tablet, Take 1 tablet (5 mg total) by mouth daily., Disp: 30 tablet, Rfl: 0 .  aspirin 81 MG chewable tablet, Chew 1 tablet (81 mg total) by mouth daily., Disp: , Rfl:  .  atorvastatin (LIPITOR) 80 MG tablet, Take 1 tablet (80 mg total) by mouth daily., Disp: 90 tablet, Rfl: 3 .  Continuous Blood Gluc Sensor (FREESTYLE LIBRE 14 DAY SENSOR) MISC, Apply topically as directed., Disp: , Rfl:  .  doxycycline (VIBRAMYCIN) 50 MG capsule, Take 1 capsule (50 mg total) by mouth daily., Disp: 30 capsule, Rfl: 0 .  hydrOXYzine (ATARAX/VISTARIL) 10 MG tablet, Take 1 tablet (10 mg total) by mouth every 4 (four) hours as needed for anxiety., Disp: 30 tablet, Rfl: 1 .  INPEN 100-PINK-NOVO DEVI, 3 (three) times daily., Disp: , Rfl:  .  insulin aspart (NOVOLOG) 100 UNIT/ML injection, Inject 0-20 Units into the skin 3 (three) times daily before meals. Per sliding scale, Disp: , Rfl:  .  lamoTRIgine (LAMICTAL) 100 MG tablet, Take 1 tablet (100 mg total) by mouth at bedtime. Take one tablet at bedtime., Disp: 30 tablet, Rfl: 1 .  metoprolol succinate (TOPROL-XL) 25 MG 24 hr tablet, Take 1 tablet (25 mg total) by mouth daily. Take with or immediately following a meal. (Patient taking differently: Take 25 mg by mouth in the morning and at bedtime. Take with or immediately following a meal.), Disp: 90  tablet, Rfl: 3 .  nitroGLYCERIN (NITROSTAT) 0.4 MG SL tablet, Place 1 tablet (0.4 mg total) under the tongue every 5 (five) minutes as needed for chest pain., Disp: 25 tablet, Rfl: 2 .  NOVOFINE PEN NEEDLE 32G X 6 MM MISC, SMARTSIG:Injection 5 Times Daily, Disp: , Rfl:  .  ticagrelor (BRILINTA) 90 MG TABS tablet, Take 1 tablet (90 mg total) by mouth 2 (two) times daily., Disp: 60 tablet, Rfl: 11 .  TRESIBA FLEXTOUCH 100 UNIT/ML FlexTouch Pen, Inject 25 Units into the skin daily. , Disp: , Rfl:   Current Facility-Administered Medications:  .  iron polysaccharides (NIFEREX) capsule 150 mg, 150 mg, Oral, Daily, [facility], Tami Lin, PA  Past Medical History: Past Medical History:  Diagnosis Date  . Anxiety   . Bipolar disorder ([place])   . CAD (coronary artery disease)    a. LHC 04/05/20: 95% stenosis of mid LAD s/p DES, 30% stenosis of proximal to mid LAD  . Diabetes mellitus type 1 ([place])   . History of borderline personality disorder   . Hyperlipidemia   . Major depression   . STEMI (ST elevation myocardial infarction) ([place]) 04/05/2020  . Tobacco use     Tobacco Use: Social History   Tobacco Use  Smoking Status Former Smoker  Smokeless Tobacco Never Used  Tobacco Comment   quit after  heartattack Sept 2021    Alicia Phillips is a current tobacco user. Intervention for tobacco cessation was provided  at the initial medical review. She was asked about readiness to quit. Alicia Phillips stated she had quit before but started up again. She is up to 5 cigarettes/day and is ready to quit again. We provided her with a fake cigarette to help cope with the oral fixation. Patient was advised and educated about tobacco cessation using combination therapy, tobacco cessation classes, quit line, and quit smoking apps. Patient demonstrated understanding of this material. Staff will continue to provide encouragement and follow up with the patient throughout the program. Labs: Recent Review Flowsheet Data    Labs for ITP  Cardiac and Pulmonary Rehab Latest Ref Rng & Units 04/06/2020   Cholestrol 0 - 200 mg/dL 193   LDLCALC 0 - 99 mg/dL 124(H)   HDL >40 mg/dL 50   Trlycerides <150 mg/dL 96   Hemoglobin A1c 4.8 - 5.6 % 8.6(H)       Exercise Target Goals: Exercise Program Goal: Individual exercise prescription set using results from initial 6 min walk test and THRR while considering  patient's activity barriers and safety.   Exercise Prescription Goal: Initial exercise prescription builds to 30-45 minutes a day of aerobic activity, 2-3 days per week.  Home exercise guidelines will be given to patient during program as part of exercise prescription that the participant will acknowledge.   Education: Aerobic Exercise & Resistance Training: - Gives group verbal and written instruction on the various components of exercise. Focuses on aerobic and resistive training programs and the benefits of this training and how to safely progress through these programs..   Education: Exercise & Equipment Safety: - Individual verbal instruction and demonstration of equipment use and safety with use of the equipment.   Cardiac Rehab from 05/11/2020 in [facility] Cardiac and Pulmonary Rehab  Date 05/11/20  Educator [address]  Instruction Review Code 1- Verbalizes Understanding      Education: Exercise Physiology & General Exercise Guidelines: - Group verbal and written instruction with models to review the exercise physiology of the cardiovascular system and associated critical values. Provides general exercise guidelines with specific guidelines to those with heart or lung disease.    Education: Flexibility, Balance, Mind/Body Relaxation: Provides group verbal/written instruction on the benefits of flexibility and balance training, including mind/body exercise modes such as yoga, pilates and tai chi.  Demonstration and skill practice provided.   Activity Barriers & Risk Stratification:  Activity Barriers & Cardiac Risk  Stratification - 05/11/20 1654      Activity Barriers & Cardiac Risk Stratification   Activity Barriers None    Cardiac Risk Stratification High           6 Minute Walk:  6 Minute Walk    Row Name 05/11/20 1614         6 Minute Walk   Phase Initial     Distance 1287 feet     Walk Time 6 minutes     # of Rest Breaks 0     MPH 2.43     METS 4.6     RPE 8     Perceived Dyspnea  0     VO2 Peak 16.1     Symptoms No     Resting HR 86 bpm     Resting BP 116/78     Resting Oxygen Saturation  99 %     Exercise Oxygen Saturation  during 6 min walk 98 %     Max Ex. HR 97 bpm     Max Ex. BP 122/74  2 Minute Post BP 118/76            Oxygen Initial Assessment:   Oxygen Re-Evaluation:   Oxygen Discharge (Final Oxygen Re-Evaluation):   Initial Exercise Prescription:  Initial Exercise Prescription - 05/11/20 1600      Date of Initial Exercise RX and Referring Provider   Date 05/11/20      Treadmill   MPH 2.4    Grade 1    Minutes 15    METs 3.17      Recumbant Elliptical   Level 2    RPM 50    Minutes 15    METs 4.6      Elliptical   Level 1    Speed 2.1    Minutes 15    METs 4.6      REL-XR   Level 3    Speed 50    Minutes 15    METs 4.6      Prescription Details   Frequency (times per week) 3    Duration Progress to 30 minutes of continuous aerobic without signs/symptoms of physical distress      Intensity   THRR 40-80% of Max Heartrate 124-162    Ratings of Perceived Exertion 11-13    Perceived Dyspnea 0-4      Progression   Progression Continue to progress workloads to maintain intensity without signs/symptoms of physical distress.      Resistance Training   Training Prescription Yes           Perform Capillary Blood Glucose checks as needed.  Exercise Prescription Changes:  Exercise Prescription Changes    Row Name 05/11/20 1600             Response to Exercise   Blood Pressure (Admit) 116/78       Blood Pressure  (Exercise) 122/74       Blood Pressure (Exit) 118/76       Heart Rate (Admit) 86 bpm       Heart Rate (Exercise) 97 bpm       Heart Rate (Exit) 88 bpm       Oxygen Saturation (Admit) 99 %       Oxygen Saturation (Exercise) 98 %       Oxygen Saturation (Exit) 98 %       Rating of Perceived Exertion (Exercise) 8       Perceived Dyspnea (Exercise) 0       Symptoms none       Comments walk test results       Duration Progress to 30 minutes of  aerobic without signs/symptoms of physical distress         Resistance Training   Weight 4 lb       Reps 10-15              Exercise Comments:   Exercise Goals and Review:  Exercise Goals    Row Name 05/11/20 1659             Exercise Goals   Increase Physical Activity Yes       Intervention Provide advice, education, support and counseling about physical activity/exercise needs.;Develop an individualized exercise prescription for aerobic and resistive training based on initial evaluation findings, risk stratification, comorbidities and participant's personal goals.       Expected Outcomes Short Term: Attend rehab on a regular basis to increase amount of physical activity.;Long Term: Exercising regularly at least 3-5 days a week.;Long Term: Add in home exercise to make  exercise part of routine and to increase amount of physical activity.       Increase Strength and Stamina Yes       Intervention Provide advice, education, support and counseling about physical activity/exercise needs.;Develop an individualized exercise prescription for aerobic and resistive training based on initial evaluation findings, risk stratification, comorbidities and participant's personal goals.       Expected Outcomes Short Term: Increase workloads from initial exercise prescription for resistance, speed, and METs.;Short Term: Perform resistance training exercises routinely during rehab and add in resistance training at home;Long Term: Improve cardiorespiratory  fitness, muscular endurance and strength as measured by increased METs and functional capacity (6MWT)       Able to understand and use rate of perceived exertion (RPE) scale Yes       Intervention Provide education and explanation on how to use RPE scale       Expected Outcomes Short Term: Able to use RPE daily in rehab to express subjective intensity level;Long Term:  Able to use RPE to guide intensity level when exercising independently       Able to understand and use Dyspnea scale Yes       Intervention Provide education and explanation on how to use Dyspnea scale       Expected Outcomes Short Term: Able to use Dyspnea scale daily in rehab to express subjective sense of shortness of breath during exertion;Long Term: Able to use Dyspnea scale to guide intensity level when exercising independently       Knowledge and understanding of Target Heart Rate Range (THRR) Yes       Intervention Provide education and explanation of THRR including how the numbers were predicted and where they are located for reference       Expected Outcomes Short Term: Able to state/look up THRR;Long Term: Able to use THRR to govern intensity when exercising independently;Short Term: Able to use daily as guideline for intensity in rehab       Able to check pulse independently Yes       Intervention Provide education and demonstration on how to check pulse in carotid and radial arteries.;Review the importance of being able to check your own pulse for safety during independent exercise       Expected Outcomes Short Term: Able to explain why pulse checking is important during independent exercise;Long Term: Able to check pulse independently and accurately       Understanding of Exercise Prescription Yes       Intervention Provide education, explanation, and written materials on patient's individual exercise prescription       Expected Outcomes Short Term: Able to explain program exercise prescription;Long Term: Able to explain  home exercise prescription to exercise independently              Exercise Goals Re-Evaluation :   Discharge Exercise Prescription (Final Exercise Prescription Changes):  Exercise Prescription Changes - 05/11/20 1600      Response to Exercise   Blood Pressure (Admit) 116/78    Blood Pressure (Exercise) 122/74    Blood Pressure (Exit) 118/76    Heart Rate (Admit) 86 bpm    Heart Rate (Exercise) 97 bpm    Heart Rate (Exit) 88 bpm    Oxygen Saturation (Admit) 99 %    Oxygen Saturation (Exercise) 98 %    Oxygen Saturation (Exit) 98 %    Rating of Perceived Exertion (Exercise) 8    Perceived Dyspnea (Exercise) 0    Symptoms none  Comments walk test results    Duration Progress to 30 minutes of  aerobic without signs/symptoms of physical distress      Resistance Training   Weight 4 lb    Reps 10-15           Nutrition:  Target Goals: Understanding of nutrition guidelines, daily intake of sodium <1545m, cholesterol <2053m calories 30% from fat and 7% or less from saturated fats, daily to have 5 or more servings of fruits and vegetables.  Education: Controlling Sodium/Reading Food Labels -Group verbal and written material supporting the discussion of sodium use in heart healthy nutrition. Review and explanation with models, verbal and written materials for utilization of the food label.   Education: General Nutrition Guidelines/Fats and Fiber: -Group instruction provided by verbal, written material, models and posters to present the general guidelines for heart healthy nutrition. Gives an explanation and review of dietary fats and fiber.   Biometrics:  Pre Biometrics - 05/11/20 1613      Pre Biometrics   Height 5' 7.5" (1.715 m)    Weight 165 lb (74.8 kg)    BMI (Calculated) 25.45    Single Leg Stand 30 seconds            Nutrition Therapy Plan and Nutrition Goals:   Nutrition Assessments:  Nutrition Assessments - 05/11/20 1604      MEDFICTS Scores    Pre Score 32           MEDIFICTS Score Key:          ?70 Need to make dietary changes          40-70 Heart Healthy Diet         ? 40 Therapeutic Level Cholesterol Diet  Nutrition Goals Re-Evaluation:   Nutrition Goals Discharge (Final Nutrition Goals Re-Evaluation):   Psychosocial: Target Goals: Acknowledge presence or absence of significant depression and/or stress, maximize coping skills, provide positive support system. Participant is able to verbalize types and ability to use techniques and skills needed for reducing stress and depression.   Education: Depression - Provides group verbal and written instruction on the correlation between heart/lung disease and depressed mood, treatment options, and the stigmas associated with seeking treatment.   Education: Sleep Hygiene -Provides group verbal and written instruction about how sleep can affect your health.  Define sleep hygiene, discuss sleep cycles and impact of sleep habits. Review good sleep hygiene tips.     Education: Stress and Anxiety: - Provides group verbal and written instruction about the health risks of elevated stress and causes of high stress.  Discuss the correlation between heart/lung disease and anxiety and treatment options. Review healthy ways to manage with stress and anxiety.    Initial Review & Psychosocial Screening:  Initial Psych Review & Screening - 05/05/20 0944      Initial Review   Current issues with Current Anxiety/Panic;Current Stress Concerns;Current Sleep Concerns;History of Depression;Current Psychotropic Meds    Source of Stress Concerns --    Comments Post MI, history of anxiety/depression      Family Dynamics   Good Support System? Yes      Barriers   Psychosocial barriers to participate in program There are no identifiable barriers or psychosocial needs.;The patient should benefit from training in stress management and relaxation.      Screening Interventions   Interventions  Encouraged to exercise;Provide feedback about the scores to participant;To provide support and resources with identified psychosocial needs    Expected Outcomes Short Term goal: Utilizing  psychosocial counselor, staff and physician to assist with identification of specific Stressors or current issues interfering with healing process. Setting desired goal for each stressor or current issue identified.;Long Term Goal: Stressors or current issues are controlled or eliminated.;Short Term goal: Identification and review with participant of any Quality of Life or Depression concerns found by scoring the questionnaire.;Long Term goal: The participant improves quality of Life and PHQ9 Scores as seen by post scores and/or verbalization of changes           Quality of Life Scores:   Quality of Life - 05/11/20 1603      Quality of Life   Select Quality of Life      Quality of Life Scores   Health/Function Pre 12.53 %    Socioeconomic Pre 20.5 %    Psych/Spiritual Pre 15 %    Family Pre 23 %    GLOBAL Pre 15.8 %          Scores of 19 and below usually indicate a poorer quality of life in these areas.  A difference of  2-3 points is a clinically meaningful difference.  A difference of 2-3 points in the total score of the Quality of Life Index has been associated with significant improvement in overall quality of life, self-image, physical symptoms, and general health in studies assessing change in quality of life.  PHQ-9: Recent Review Flowsheet Data    Depression screen [facility] 2/9 05/11/2020   Decreased Interest 1   Down, Depressed, Hopeless 2   PHQ - 2 Score 3   Altered sleeping 2   Tired, decreased energy 1   Change in appetite 1   Feeling bad or failure about yourself  0   Trouble concentrating 0   Moving slowly or fidgety/restless 0   Suicidal thoughts 0   PHQ-9 Score 7   Difficult doing work/chores Somewhat difficult     Interpretation of Total Score  Total Score Depression Severity:   1-4 = Minimal depression, 5-9 = Mild depression, 10-14 = Moderate depression, 15-19 = Moderately severe depression, 20-27 = Severe depression   Psychosocial Evaluation and Intervention:  Psychosocial Evaluation - 05/05/20 1004      Psychosocial Evaluation & Interventions   Comments Alicia Phillips is working very hard post MI to get back into a good mental state. She had her MI while trying to relax, so she is currently taking anxiety meds to help sleep. She is seeing a therapist for anxiety, attending relaxation classes, and has been given an extension from her work to be out til December. She is really looking forward to coming to Cardiac Rehab so she can learn more about her health. She quit smoking after her heart attack is very motivated to stay away from smoking. Her treatment for diabetes has had to go through recent changes in response to her fluctuating CBG readings.    Expected Outcomes Short: attend cardiac rehab for education and exercise. Long: maintain positive self care habits.    Continue Psychosocial Services  Follow up required by staff           Psychosocial Re-Evaluation:   Psychosocial Discharge (Final Psychosocial Re-Evaluation):   Vocational Rehabilitation: Provide vocational rehab assistance to qualifying candidates.   Vocational Rehab Evaluation & Intervention:  Vocational Rehab - 05/05/20 0942      Initial Vocational Rehab Evaluation & Intervention   Assessment shows need for Vocational Rehabilitation No           Education: Education Goals: Education classes  will be provided on a variety of topics geared toward better understanding of heart health and risk factor modification. Participant will state understanding/return demonstration of topics presented as noted by education test scores.  Learning Barriers/Preferences:  Learning Barriers/Preferences - 05/05/20 0942      Learning Barriers/Preferences   Learning Barriers None    Learning Preferences None            General Cardiac Education Topics:  AED/CPR: - Group verbal and written instruction with the use of models to demonstrate the basic use of the AED with the basic ABC's of resuscitation.   Anatomy & Physiology of the Heart: - Group verbal and written instruction and models provide basic cardiac anatomy and physiology, with the coronary electrical and arterial systems. Review of Valvular disease and Heart Failure   Cardiac Procedures: - Group verbal and written instruction to review commonly prescribed medications for heart disease. Reviews the medication, class of the drug, and side effects. Includes the steps to properly store meds and maintain the prescription regimen. (beta blockers and nitrates)   Cardiac Medications I: - Group verbal and written instruction to review commonly prescribed medications for heart disease. Reviews the medication, class of the drug, and side effects. Includes the steps to properly store meds and maintain the prescription regimen.   Cardiac Medications II: -Group verbal and written instruction to review commonly prescribed medications for heart disease. Reviews the medication, class of the drug, and side effects. (all other drug classes)    Go Sex-Intimacy & Heart Disease, Get SMART - Goal Setting: - Group verbal and written instruction through game format to discuss heart disease and the return to sexual intimacy. Provides group verbal and written material to discuss and apply goal setting through the application of the S.M.A.R.T. Method.   Other Matters of the Heart: - Provides group verbal, written materials and models to describe Stable Angina and Peripheral Artery. Includes description of the disease process and treatment options available to the cardiac patient.   Infection Prevention: - Provides verbal and written material to individual with discussion of infection control including proper hand washing and proper equipment cleaning  during exercise session.   Cardiac Rehab from 05/11/2020 in [facility] Cardiac and Pulmonary Rehab  Date 05/11/20  Educator [place]  Instruction Review Code 1- Verbalizes Understanding      Falls Prevention: - Provides verbal and written material to individual with discussion of falls prevention and safety.   Cardiac Rehab from 05/11/2020 in [facility] Cardiac and Pulmonary Rehab  Date 05/11/20  Educator [place]  Instruction Review Code 1- Verbalizes Understanding      Other: -Provides group and verbal instruction on various topics (see comments)   Knowledge Questionnaire Score:  Knowledge Questionnaire Score - 05/11/20 1702      Knowledge Questionnaire Score   Pre Score 26/26           Core Components/Risk Factors/Patient Goals at Admission:  Personal Goals and Risk Factors at Admission - 05/11/20 1700      Core Components/Risk Factors/Patient Goals on Admission    Weight Management Yes;Weight Maintenance    Intervention Weight Management/Obesity: Establish reasonable short term and long term weight goals.;Weight Management: Provide education and appropriate resources to help participant work on and attain dietary goals.;Weight Management: Develop a combined nutrition and exercise program designed to reach desired caloric intake, while maintaining appropriate intake of nutrient and fiber, sodium and fats, and appropriate energy expenditure required for the weight goal.    Admit Weight 165 lb (  74.8 kg)    Goal Weight: Short Term 165 lb (74.8 kg)    Goal Weight: Long Term 165 lb (74.8 kg)    Expected Outcomes Short Term: Continue to assess and modify interventions until short term weight is achieved;Weight Maintenance: Understanding of the daily nutrition guidelines, which includes 25-35% calories from fat, 7% or less cal from saturated fats, less than 272m cholesterol, less than 1.5gm of sodium, & 5 or more servings of fruits and vegetables daily;Understanding recommendations for meals to include  15-35% energy as protein, 25-35% energy from fat, 35-60% energy from carbohydrates, less than 2075mof dietary cholesterol, 20-35 gm of total fiber daily;Understanding of distribution of calorie intake throughout the day with the consumption of 4-5 meals/snacks    Tobacco Cessation Yes    Intervention Offer self-teaching materials, assist with locating and accessing local/national Quit Smoking programs, and support quit date choice.;Assist the participant in steps to quit. Provide individualized education and counseling about committing to Tobacco Cessation, relapse prevention, and pharmacological support that can be provided by physician.    Expected Outcomes Short Term: Will demonstrate readiness to quit, by selecting a quit date.;Long Term: Complete abstinence from all tobacco products for at least 12 months from quit date.;Short Term: Will quit all tobacco product use, adhering to prevention of relapse plan.    Diabetes Yes    Intervention Provide education about signs/symptoms and action to take for hypo/hyperglycemia.;Provide education about proper nutrition, including hydration, and aerobic/resistive exercise prescription along with prescribed medications to achieve blood glucose in normal ranges: Fasting glucose 65-99 mg/dL    Expected Outcomes Short Term: Participant verbalizes understanding of the signs/symptoms and immediate care of hyper/hypoglycemia, proper foot care and importance of medication, aerobic/resistive exercise and nutrition plan for blood glucose control.;Long Term: Attainment of HbA1C < 7%.    Hypertension Yes    Intervention Provide education on lifestyle modifcations including regular physical activity/exercise, weight management, moderate sodium restriction and increased consumption of fresh fruit, vegetables, and low fat dairy, alcohol moderation, and smoking cessation.;Monitor prescription use compliance.    Expected Outcomes Short Term: Continued assessment and intervention  until BP is < 140/9057mG in hypertensive participants. < 130/59m71m in hypertensive participants with diabetes, heart failure or chronic kidney disease.;Long Term: Maintenance of blood pressure at goal levels.    Lipids Yes    Intervention Provide education and support for participant on nutrition & aerobic/resistive exercise along with prescribed medications to achieve LDL <70mg92mL >40mg.1mExpected Outcomes Short Term: Participant states understanding of desired cholesterol values and is compliant with medications prescribed. Participant is following exercise prescription and nutrition guidelines.;Long Term: Cholesterol controlled with medications as prescribed, with individualized exercise RX and with personalized nutrition plan. Value goals: LDL < 70mg, 12m> 40 mg.           Education:Diabetes - Individual verbal and written instruction to review signs/symptoms of diabetes, desired ranges of glucose level fasting, after meals and with exercise. Acknowledge that pre and post exercise glucose checks will be done for 3 sessions at entry of program.   [facility]  Date 05/05/20  Educator [facility]  InsLakewood Health Systemuction Review Code 1- VerbaliUnited States Steel Corporationtanding      Education: Know Your Numbers and Risk Factors: -Group verbal and written instruction about important numbers in your health.  Discussion of what are risk factors and how they play a role in the disease process.  Review of Cholesterol, Blood Pressure, Diabetes,  and BMI and the role they play in your overall health.   Core Components/Risk Factors/Patient Goals Review:    Core Components/Risk Factors/Patient Goals at Discharge (Final Review):    ITP Comments:  ITP Comments    Row Name 05/05/20 0936 05/11/20 1645 05/11/20 1646       ITP Comments Initial telephone orientation completed. Diagnosis can be found in [facility] 9/8.EP orientation scheduled for 10/14 at 2pm Completed 6MWT and gym  orientation. Initial ITP created and sent for review to Dr. Tamberly Filbert, Medical Director. Alicia Phillips is a current tobacco user. Intervention for tobacco cessation was provided at the initial medical review. She was asked about readiness to quit. Alicia Phillips stated she had quit before but started up again. She is up to 5 cigarettes/day and is ready to quit again. We provided her with a fake cigarette to help cope with the oral fixation. Patient was advised and educated about tobacco cessation using combination therapy, tobacco cessation classes, quit line, and quit smoking apps. Patient demonstrated understanding of this material. Staff will continue to provide encouragement and follow up with the patient throughout the program.            Comments: Initial ITP

## 2020-05-11 NOTE — Patient Instructions (Signed)
Patient Instructions  Patient Details  Name: Alicia Phillips MRN: [id number] Date of Birth: [birth year] Referring Provider:  Runell Gess, MD  Below are your personal goals for exercise, nutrition, and risk factors. Our goal is to help you stay on track towards obtaining and maintaining these goals. We will be discussing your progress on these goals with you throughout the program.  Initial Exercise Prescription:  Initial Exercise Prescription - 05/11/20 1600      Date of Initial Exercise RX and Referring Provider   Date 05/11/20      Treadmill   MPH 2.4    Grade 1    Minutes 15    METs 3.17      Recumbant Elliptical   Level 2    RPM 50    Minutes 15    METs 4.6      Elliptical   Level 1    Speed 2.1    Minutes 15    METs 4.6      REL-XR   Level 3    Speed 50    Minutes 15    METs 4.6      Prescription Details   Frequency (times per week) 3    Duration Progress to 30 minutes of continuous aerobic without signs/symptoms of physical distress      Intensity   THRR 40-80% of Max Heartrate 124-162    Ratings of Perceived Exertion 11-13    Perceived Dyspnea 0-4      Progression   Progression Continue to progress workloads to maintain intensity without signs/symptoms of physical distress.      Resistance Training   Training Prescription Yes           Exercise Goals: Frequency: Be able to perform aerobic exercise two to three times per week in program working toward 2-5 days per week of home exercise.  Intensity: Work with a perceived exertion of 11 (fairly light) - 15 (hard) while following your exercise prescription.  We will make changes to your prescription with you as you progress through the program.   Duration: Be able to do 30 to 45 minutes of continuous aerobic exercise in addition to a 5 minute warm-up and a 5 minute cool-down routine.   Nutrition Goals: Your personal nutrition goals will be established when you do your nutrition analysis with  the dietician.  The following are general nutrition guidelines to follow: Cholesterol < 200mg /day Sodium < 1500mg /day Fiber: Women over 50 yrs - 21 grams per day  Personal Goals:  Personal Goals and Risk Factors at Admission - 05/11/20 1700      Core Components/Risk Factors/Patient Goals on Admission    Weight Management Yes;Weight Maintenance    Intervention Weight Management/Obesity: Establish reasonable short term and long term weight goals.;Weight Management: Provide education and appropriate resources to help participant work on and attain dietary goals.;Weight Management: Develop a combined nutrition and exercise program designed to reach desired caloric intake, while maintaining appropriate intake of nutrient and fiber, sodium and fats, and appropriate energy expenditure required for the weight goal.    Admit Weight 165 lb (74.8 kg)    Goal Weight: Short Term 165 lb (74.8 kg)    Goal Weight: Long Term 165 lb (74.8 kg)    Expected Outcomes Short Term: Continue to assess and modify interventions until short term weight is achieved;Weight Maintenance: Understanding of the daily nutrition guidelines, which includes 25-35% calories from fat, 7% or less cal from saturated fats, less than 200mg  cholesterol, less  than 1.5gm of sodium, & 5 or more servings of fruits and vegetables daily;Understanding recommendations for meals to include 15-35% energy as protein, 25-35% energy from fat, 35-60% energy from carbohydrates, less than 200mg  of dietary cholesterol, 20-35 gm of total fiber daily;Understanding of distribution of calorie intake throughout the day with the consumption of 4-5 meals/snacks    Tobacco Cessation Yes    Intervention Offer self-teaching materials, assist with locating and accessing local/national Quit Smoking programs, and support quit date choice.;Assist the participant in steps to quit. Provide individualized education and counseling about committing to Tobacco Cessation, relapse  prevention, and pharmacological support that can be provided by physician.    Expected Outcomes Short Term: Will demonstrate readiness to quit, by selecting a quit date.;Long Term: Complete abstinence from all tobacco products for at least 12 months from quit date.;Short Term: Will quit all tobacco product use, adhering to prevention of relapse plan.    Diabetes Yes    Intervention Provide education about signs/symptoms and action to take for hypo/hyperglycemia.;Provide education about proper nutrition, including hydration, and aerobic/resistive exercise prescription along with prescribed medications to achieve blood glucose in normal ranges: Fasting glucose 65-99 mg/dL    Expected Outcomes Short Term: Participant verbalizes understanding of the signs/symptoms and immediate care of hyper/hypoglycemia, proper foot care and importance of medication, aerobic/resistive exercise and nutrition plan for blood glucose control.;Long Term: Attainment of HbA1C < 7%.    Hypertension Yes    Intervention Provide education on lifestyle modifcations including regular physical activity/exercise, weight management, moderate sodium restriction and increased consumption of fresh fruit, vegetables, and low fat dairy, alcohol moderation, and smoking cessation.;Monitor prescription use compliance.    Expected Outcomes Short Term: Continued assessment and intervention until BP is < 140/5mm HG in hypertensive participants. < 130/81mm HG in hypertensive participants with diabetes, heart failure or chronic kidney disease.;Long Term: Maintenance of blood pressure at goal levels.    Lipids Yes    Intervention Provide education and support for participant on nutrition & aerobic/resistive exercise along with prescribed medications to achieve LDL 70mg , HDL >40mg .    Expected Outcomes Short Term: Participant states understanding of desired cholesterol values and is compliant with medications prescribed. Participant is following exercise  prescription and nutrition guidelines.;Long Term: Cholesterol controlled with medications as prescribed, with individualized exercise RX and with personalized nutrition plan. Value goals: LDL < 70mg , HDL > 40 mg.           Tobacco Use Initial Evaluation: Social History   Tobacco Use  Smoking Status Former Smoker  Smokeless Tobacco Never Used  Tobacco Comment   quit after  heartattack Sept 2021    Exercise Goals and Review:  Exercise Goals    Row Name 05/11/20 1659             Exercise Goals   Increase Physical Activity Yes       Intervention Provide advice, education, support and counseling about physical activity/exercise needs.;Develop an individualized exercise prescription for aerobic and resistive training based on initial evaluation findings, risk stratification, comorbidities and participant's personal goals.       Expected Outcomes Short Term: Attend rehab on a regular basis to increase amount of physical activity.;Long Term: Exercising regularly at least 3-5 days a week.;Long Term: Add in home exercise to make exercise part of routine and to increase amount of physical activity.       Increase Strength and Stamina Yes       Intervention Provide advice, education, support and counseling about physical  activity/exercise needs.;Develop an individualized exercise prescription for aerobic and resistive training based on initial evaluation findings, risk stratification, comorbidities and participant's personal goals.       Expected Outcomes Short Term: Increase workloads from initial exercise prescription for resistance, speed, and METs.;Short Term: Perform resistance training exercises routinely during rehab and add in resistance training at home;Long Term: Improve cardiorespiratory fitness, muscular endurance and strength as measured by increased METs and functional capacity ( )       Able to understand and use rate of perceived exertion (RPE) scale Yes       Intervention  Provide education and explanation on how to use RPE scale       Expected Outcomes Short Term: Able to use RPE daily in rehab to express subjective intensity level;Long Term:  Able to use RPE to guide intensity level when exercising independently       Able to understand and use Dyspnea scale Yes       Intervention Provide education and explanation on how to use Dyspnea scale       Expected Outcomes Short Term: Able to use Dyspnea scale daily in rehab to express subjective sense of shortness of breath during exertion;Long Term: Able to use Dyspnea scale to guide intensity level when exercising independently       Knowledge and understanding of Target Heart Rate Range (THRR) Yes       Intervention Provide education and explanation of THRR including how the numbers were predicted and where they are located for reference       Expected Outcomes Short Term: Able to state/look up THRR;Long Term: Able to use THRR to govern intensity when exercising independently;Short Term: Able to use daily as guideline for intensity in rehab       Able to check pulse independently Yes       Intervention Provide education and demonstration on how to check pulse in carotid and radial arteries.;Review the importance of being able to check your own pulse for safety during independent exercise       Expected Outcomes Short Term: Able to explain why pulse checking is important during independent exercise;Long Term: Able to check pulse independently and accurately       Understanding of Exercise Prescription Yes       Intervention Provide education, explanation, and written materials on patient's individual exercise prescription       Expected Outcomes Short Term: Able to explain program exercise prescription;Long Term: Able to explain home exercise prescription to exercise independently              Copy of goals given to participant.

## 2020-05-11 NOTE — Therapy (Signed)
[facility] PARTIAL HOSPITALIZATION PROGRAM [address] [place], [place], [address] Phone: [phone]   Fax:  [phone] Virtual Visit via Video Note  I connected with Alicia Phillips on 05/11/20 at  11:00 AM EDT by a video enabled telemedicine application and verified that I am speaking with the correct person using two identifiers.   I discussed the limitations of evaluation and management by telemedicine and the availability of in person appointments. The patient expressed understanding and agreed to proceed.    I discussed the assessment and treatment plan with the patient. The patient was provided an opportunity to ask questions and all were answered. The patient agreed with the plan and demonstrated an understanding of the instructions.   The patient was advised to call back or seek an in-person evaluation if the symptoms worsen or if the condition fails to improve as anticipated.  Location: Patient - Patient Home OT - Clinic Office  I provided 45 minutes of non-face-to-face time during this encounter.   Alicia Phillips, OT   Occupational Therapy Treatment  Patient Details  Name: Alicia Phillips MRN: [id number] Date of Birth: 07-Oct-1980 Referring Provider (OT): Hillery Jacks   Encounter Date: 05/11/2020   OT End of Session - 05/11/20 1300    Visit Number 14    Number of Visits 20    Date for OT Re-Evaluation 05/17/20    Authorization Type [employer]    Authorization Time Period no auth or visit limit    OT Start Time 1115    OT Stop Time 1200    OT Time Calculation (min) 45 min    Activity Tolerance Patient tolerated treatment well    Behavior During Therapy WFL for tasks assessed/performed           Past Medical History:  Diagnosis Date  . Anxiety   . Bipolar disorder (HCC)   . CAD (coronary artery disease)    a. LHC 04/05/20: 95% stenosis of mid LAD s/p DES, 30% stenosis of proximal to mid LAD  . Diabetes mellitus type 1 (HCC)    . History of borderline personality disorder   . Hyperlipidemia   . Major depression   . STEMI (ST elevation myocardial infarction) (HCC) 04/05/2020  . Tobacco use     Past Surgical History:  Procedure Laterality Date  . CORONARY STENT INTERVENTION N/A 04/05/2020   Procedure: CORONARY STENT INTERVENTION;  Surgeon: Iran Ouch, MD;  Location: [facility] INVASIVE CV LAB;  Service: Cardiovascular;  Laterality: N/A;  . LEFT HEART CATH AND CORONARY ANGIOGRAPHY N/A 04/05/2020   Procedure: LEFT HEART CATH AND CORONARY ANGIOGRAPHY;  Surgeon: Iran Ouch, MD;  Location: [facility] INVASIVE CV LAB;  Service: Cardiovascular;  Laterality: N/A;    There were no vitals filed for this visit.   Subjective Assessment - 05/11/20 1259    Currently in Pain? No/denies             OT Education - 05/11/20 1259    Education Details Educated on identifying worry and utilized circle on control tool to categorize what we do and do not have control over    Person(s) Educated Patient    Methods Explanation;Handout    Comprehension Verbalized understanding           Group Session:  S: "I think this was a really helpful topic to get into as I have to head over to my cardio appointment after therapy."  O: Group session encouraged increased participation and engagement through  discussion focused on worry and our circle of control. Group reviewed a powerpoint that discussed healthy vs unhealthy worry with specific examples provided. Discussion also focused on utilizing the circle of control outline to identify what is within our control, what we have influence on, and what is not in our control. Group members shared specific examples and worries and identified what categories they fell in within the circle of control.   A: Alicia Phillips was active in her participation of group session and activity. Pt identified strong benefit in use of the circle of control, recognizing that it's helpful to identify the circle of influence  as a "gray area" sharing she has a tendency to think in "black and white" of what can and cannot be controlled. Pt shared that this would be helpful in situations that provoke anxiety, along with situations were arguments or disagreements occur. She shared that she would like to practice use of the circle of control as she drives to her cardio rehab appointment this afternoon, as anxiety might come up and she could tease out what feelings and actions are validated, controlled, and uncontrollable. Receptive and appreciative of further education and resources provided.   P: Continue to attend [facility] OT group sessions 5x week for the remainder of the week to promote daily structure, social engagement, and opportunities to develop and utilize adaptive strategies to maximize functional performance in preparation for safe transition and integration back into school, work, and the community.      Plan - 05/11/20 1300    Occupational performance deficits (Please refer to evaluation for details): ADL's;IADL's;Rest and Sleep;Education;Work;Leisure;Social Participation    Body Structure / Function / Physical Skills ADL    Cognitive Skills Attention;Consciousness;Emotional;Energy/Drive;Memory;Perception;Problem Solve;Safety Awareness;Temperament/Personality;Thought;Understand    Psychosocial Skills Coping Strategies;Environmental  Adaptations;Interpersonal Interaction;Routines and Behaviors;Habits           Patient will benefit from skilled therapeutic intervention in order to improve the following deficits and impairments:   Body Structure / Function / Physical Skills: ADL Cognitive Skills: Attention, Consciousness, Emotional, Energy/Drive, Memory, Perception, Problem Solve, Safety Awareness, Temperament/Personality, Thought, Understand Psychosocial Skills: Coping Strategies, Environmental  Adaptations, Interpersonal Interaction, Routines and Behaviors, Habits   Visit Diagnosis: Difficulty coping  Frontal  lobe and executive function deficit  Bipolar II disorder [facility])    Problem List Patient Active Problem List   Diagnosis Date Noted  . Tobacco abuse 04/06/2020  . Hyperlipidemia 04/06/2020  . CAD (coronary artery disease) 04/06/2020  . STEMI (ST elevation myocardial infarction) (HCC) 04/05/2020  . Depressive disorder 12/06/2016  . Type 1 diabetes mellitus (HCC) 11/04/2016    05/11/2020  Alicia Phillips, MOT, OTR/L  05/11/2020, 1:01 PM  [facility] HOSPITALIZATION PROGRAM [address] [place], [place], [address] Phone: [phone]   Fax:  [phone]  Name: Alicia Phillips MRN: [id number] Date of Birth: 1980-10-28

## 2020-05-11 NOTE — Progress Notes (Addendum)
Entered in Error

## 2020-05-11 NOTE — Telephone Encounter (Signed)
I am satisfied with those blood pressure readings.

## 2020-05-12 ENCOUNTER — Encounter (HOSPITAL_COMMUNITY): Payer: Self-pay | Admitting: Family

## 2020-05-12 ENCOUNTER — Other Ambulatory Visit (HOSPITAL_COMMUNITY): Payer: BC Managed Care – PPO | Admitting: Licensed Clinical Social Worker

## 2020-05-12 ENCOUNTER — Encounter (HOSPITAL_COMMUNITY): Payer: Self-pay

## 2020-05-12 ENCOUNTER — Ambulatory Visit (HOSPITAL_COMMUNITY): Payer: BC Managed Care – PPO

## 2020-05-12 ENCOUNTER — Other Ambulatory Visit (HOSPITAL_COMMUNITY): Payer: BC Managed Care – PPO | Admitting: Occupational Therapy

## 2020-05-12 ENCOUNTER — Other Ambulatory Visit (HOSPITAL_COMMUNITY): Payer: BC Managed Care – PPO

## 2020-05-12 DIAGNOSIS — F439 Reaction to severe stress, unspecified: Secondary | ICD-10-CM | POA: Diagnosis not present

## 2020-05-12 DIAGNOSIS — F331 Major depressive disorder, recurrent, moderate: Secondary | ICD-10-CM

## 2020-05-12 DIAGNOSIS — R41844 Frontal lobe and executive function deficit: Secondary | ICD-10-CM

## 2020-05-12 DIAGNOSIS — R4589 Other symptoms and signs involving emotional state: Secondary | ICD-10-CM

## 2020-05-12 DIAGNOSIS — F332 Major depressive disorder, recurrent severe without psychotic features: Secondary | ICD-10-CM | POA: Diagnosis not present

## 2020-05-12 DIAGNOSIS — F411 Generalized anxiety disorder: Secondary | ICD-10-CM

## 2020-05-12 DIAGNOSIS — Z79899 Other long term (current) drug therapy: Secondary | ICD-10-CM | POA: Diagnosis not present

## 2020-05-12 DIAGNOSIS — F3181 Bipolar II disorder: Secondary | ICD-10-CM

## 2020-05-12 DIAGNOSIS — F419 Anxiety disorder, unspecified: Secondary | ICD-10-CM | POA: Diagnosis not present

## 2020-05-12 MED ORDER — HYDROXYZINE HCL 10 MG PO TABS: 10.0000 mg | ORAL_TABLET | ORAL | 1 refills | Status: DC | PRN

## 2020-05-12 NOTE — Therapy (Signed)
[place], [place], [address] Phone: [phone]   Fax:  [phone] Virtual Visit via Video Note  I connected with Alicia Phillips on 05/12/20 at  10:25 AM EDT by a video enabled telemedicine application and verified that I am speaking with the correct person using two identifiers.   I discussed the limitations of evaluation and management by telemedicine and the availability of in person appointments. The patient expressed understanding and agreed to proceed.  I discussed the assessment and treatment plan with the patient. The patient was provided an opportunity to ask questions and all were answered. The patient agreed with the plan and demonstrated an understanding of the instructions.   The patient was advised to call back or seek an in-person evaluation if the symptoms worsen or if the condition fails to improve as anticipated.  Location: Patient - [name]  I provided 35 minutes of non-face-to-face time during this encounter.   Alicia Phillips, OT   Occupational Therapy Treatment  Patient Details  Name: Alicia Phillips MRN: [id number] Date of Birth: 1981/01/10 Referring Provider (OT): Ricky Ala   Encounter Date: 05/12/2020   OT End of Session - 05/12/20 1101    Visit Number 15    Number of Visits 20    Date for OT Re-Evaluation 05/17/20    Authorization Type BCBS    Authorization Time Period no auth or visit limit    OT Start Time 1025    OT Stop Time 1100    OT Time Calculation (min) 35 min    Activity Tolerance Patient tolerated treatment well    Behavior During Therapy WFL for tasks assessed/performed           Past Medical History:  Diagnosis Date  . Anxiety   . Bipolar disorder ([facility])   . CAD (coronary artery disease)    a. LHC 04/05/20: 95% stenosis of mid LAD s/p DES, 30% stenosis of proximal to mid LAD  . Diabetes mellitus type 1 ([facility])   .  History of borderline personality disorder   . Hyperlipidemia   . Major depression   . STEMI (ST elevation myocardial infarction) ([place]) 04/05/2020  . Tobacco use     Past Surgical History:  Procedure Laterality Date  . CORONARY STENT INTERVENTION N/A 04/05/2020   Procedure: CORONARY STENT INTERVENTION;  Surgeon: Wellington Hampshire, MD;  Location: [facility];  Service: Cardiovascular;  Laterality: N/A;  . LEFT HEART CATH AND CORONARY ANGIOGRAPHY N/A 04/05/2020   Procedure: LEFT HEART CATH AND CORONARY ANGIOGRAPHY;  Surgeon: Wellington Hampshire, MD;  Location: [facility];  Service: Cardiovascular;  Laterality: N/A;    There were no vitals filed for this visit.   Subjective Assessment - 05/12/20 1101    Currently in Pain? No/denies                                OT Education - 05/12/20 1101    Education Details Educated on goal progress and continued use of skills after discharge    Person(s) Educated Patient    Methods Explanation    Comprehension Verbalized understanding            OT Short Term Goals - 05/12/20 1102      OT SHORT TERM GOAL #1   Title Pt will actively engage in OT group sessions throughout  duration of PHP programming, in order to promote daily structure, social engagement, and opportunities to develop and utilize adaptive strategies to maximize functional performance in preparation for safe transition and integration back into school, work, and the community.    Time 4    Period Weeks    Status Achieved    Target Date 05/17/20      OT SHORT TERM GOAL #2   Title Pt will practice and identify 1-3 adaptive coping strategies she can utilize, in order to safely manage increased depression/anxiety, with min cues, in preparation for safe and healthy reintegration back into the community at discharge.    Status Achieved      OT SHORT TERM GOAL #3   Title Pt will develop 1-2 self-identified short-term goals she would like to achieve,  by the end of PHP programming, in order to promote healthy and achievable goal-setting skills that she can utilize to hold herself accountable as she transitions back into the community at discharge.    Status Achieved      OT SHORT TERM GOAL #4   Title Pt will demonstrate an increase in assertive communication skills, as evidenced by, identifying at least 2 "I" statements when sharing their feelings/needs, with min cues, in preparation for reintegration back into the community at discharge.    Status Achieved         Group Session:  S: "I agree, I think it's hard to see the progress, until I look back or point things out, but I feel really good about everything I've learned and putting it into active practice."  O: Today's session focused on discharge planning and review of OT goals set at the beginning of PHP program. Patient and clinician reviewed goals set and whether group members achieved goals and what areas needed continued work/progress. Questions and concerns regarding discharge planning also discussed.   A: Alicia Phillips was active in her participation of discussion and identified many successes during her time in PHP. She achieved all of her OT goals set for herself, while also identifying specific areas of occupation that have improved, including basic self-care/hygiene, driving, engaging in leisure interests, and managing ADL/iADLs. At the start of programming, pt identified basic hygiene as a "3/4" however has since improved and as of today, a "7" out of 10. Pt identified less difficulty engaging in hygiene, along with making meals for herself and cleaning up around the house. Pt also identified improvement in managing her mental health, identifying several learned and practiced coping skills including meditation, Zentangle, distraction skills, music, and collage.  P: Plan to discharge patient from [facility], effective today, 05/12/20, with plan to follow-up and begin [facility] starting Monday  05/15/20.  OCCUPATIONAL THERAPY DISCHARGE SUMMARY  Visits from Start of Care: 15  Current functional level related to goals / functional outcomes: Pt has achieved all of her OT goals within the [facility] program, as noted above. Plan for patient to attend [facility] for continued treatment and therapy, beginning Monday 05/15/20   Remaining deficits: See above   Education / Equipment: See above   Plan: Patient agrees to discharge.  Patient goals were met. Patient is being discharged due to meeting the stated rehab goals.  ?????                  Plan - 05/12/20 1101    Occupational performance deficits (Please refer to evaluation for details): ADL's;IADL's;Rest and Sleep;Education;Work;Leisure;Social Participation    Body Structure / Function / Physical Skills ADL  Cognitive Skills Attention;Consciousness;Emotional;Energy/Drive;Memory;Perception;Problem Solve;Safety Awareness;Temperament/Personality;Thought;Understand    Psychosocial Skills Coping Strategies;Environmental  Adaptations;Interpersonal Interaction;Routines and Behaviors;Habits           Patient will benefit from skilled therapeutic intervention in order to improve the following deficits and impairments:   Body Structure / Function / Physical Skills: ADL Cognitive Skills: Attention, Consciousness, Emotional, Energy/Drive, Memory, Perception, Problem Solve, Safety Awareness, Temperament/Personality, Thought, Understand Psychosocial Skills: Coping Strategies, Environmental  Adaptations, Interpersonal Interaction, Routines and Behaviors, Habits   Visit Diagnosis: Difficulty coping  Frontal lobe and executive function deficit  Bipolar II disorder [facility])    Problem List Patient Active Problem List   Diagnosis Date Noted  . Tobacco abuse 04/06/2020  . Hyperlipidemia 04/06/2020  . CAD (coronary artery disease) 04/06/2020  . STEMI (ST elevation myocardial infarction) ([facility]) 04/05/2020  . Depressive disorder  12/06/2016  . Type 1 diabetes mellitus ([facility]) 11/04/2016    05/12/2020  Alicia Phillips, MOT, OTR/L  05/12/2020, 11:02 AM  [facility] HOSPITALIZATION PROGRAM [place], [place], [address] Phone: [phone]   Fax:  [phone]  Name: Alicia Phillips MRN: [id number] Date of Birth: [birth year]

## 2020-05-12 NOTE — Progress Notes (Signed)
Virtual Visit via Telephone Note  I connected with Alicia Phillips on 05/12/20 at  9:00 AM EDT by telephone and verified that I am speaking with the correct person using two identifiers.   I discussed the limitations, risks, security and privacy concerns of performing an evaluation and management service by telephone and the availability of in person appointments. I also discussed with the patient that there may be a patient responsible charge related to this service. The patient expressed understanding and agreed to proceed.  I discussed the assessment and treatment plan with the patient. The patient was provided an opportunity to ask questions and all were answered. The patient agreed with the plan and demonstrated an understanding of the instructions.   The patient was advised to call back or seek an in-person evaluation if the symptoms worsen or if the condition fails to improve as anticipated.  I provided 15 minutes of non-face-to-face time during this encounter.   [name], NP   [facility] Partial Outpatient Program Discharge Summary  Alicia Phillips [id number]  Admission date: 04/19/20 Discharge date: 05/12/2020  Reason for admission: Per admission assessment note: Alicia Phillips is a 39 y.o. Caucasian female presents with worsening depression and anxiety.  Patient appears anxious and is tearful throughout this assessment.  Leniyah reports she was referred by her general practitioner due to frequent panic and anxiety attacks.  She reports she experienced a heart attack which is now caused her to be hypervigilant with her medical history.  States her primary care provider prescribes Lamictal and hydroxyzine for mood stabilization.  She reports she was diagnosed with depressed, bipolar disorder and borderline personality disorder.  She denies previous inpatient admissions.  Denies previous suicide or self-harm attempts.  Patient reports substance abuse with marijuana  and occasional alcohol use socially.  Denied any other illicit drugs.  Denies history with physical or sexual abuse in the past.  Progress in Program Toward Treatment Goals: Ongoing, Selinda attended and participated with daily group session with active and engaged participation.  She continues to report multiple panic attacks, racing thoughts and increased anxiety.  Denying suicidal or homicidal ideations.  Denies auditory or visual hallucinations.  Patient to transition to intensive outpatient program we will continue to monitor.  Medication was refilled at discharge.  Progress (rationale): Stepping down to intensive outpatient programming on 05/15/2020  Take all medications as prescribed. Keep all follow-up appointments as scheduled.  Do not consume alcohol or use illegal drugs while on prescription medications. Report any adverse effects from your medications to your primary care provider promptly.  In the event of recurrent symptoms or worsening symptoms, call 911, a crisis hotline, or go to the nearest emergency department for evaluation.    [name], NP 05/12/2020

## 2020-05-15 ENCOUNTER — Ambulatory Visit (HOSPITAL_COMMUNITY): Payer: BC Managed Care – PPO

## 2020-05-15 ENCOUNTER — Encounter (HOSPITAL_COMMUNITY): Payer: Self-pay | Admitting: Psychiatry

## 2020-05-15 ENCOUNTER — Other Ambulatory Visit (HOSPITAL_COMMUNITY): Payer: BC Managed Care – PPO | Admitting: Psychiatry

## 2020-05-15 ENCOUNTER — Other Ambulatory Visit (HOSPITAL_COMMUNITY): Payer: BC Managed Care – PPO

## 2020-05-15 ENCOUNTER — Other Ambulatory Visit: Payer: Self-pay

## 2020-05-15 DIAGNOSIS — F439 Reaction to severe stress, unspecified: Secondary | ICD-10-CM | POA: Diagnosis not present

## 2020-05-15 DIAGNOSIS — F411 Generalized anxiety disorder: Secondary | ICD-10-CM

## 2020-05-15 DIAGNOSIS — Z79899 Other long term (current) drug therapy: Secondary | ICD-10-CM | POA: Diagnosis not present

## 2020-05-15 DIAGNOSIS — F332 Major depressive disorder, recurrent severe without psychotic features: Secondary | ICD-10-CM | POA: Diagnosis not present

## 2020-05-15 DIAGNOSIS — F331 Major depressive disorder, recurrent, moderate: Secondary | ICD-10-CM

## 2020-05-15 DIAGNOSIS — F419 Anxiety disorder, unspecified: Secondary | ICD-10-CM | POA: Diagnosis not present

## 2020-05-15 NOTE — Progress Notes (Signed)
Virtual Visit via Video Note  I connected with Alicia Phillips on 05/15/20 at  8:00 AM EDT by a video enabled telemedicine application and verified that I am speaking with the correct person using two identifiers.   I discussed the limitations of evaluation and management by telemedicine and the availability of in person appointments. The patient expressed understanding and agreed to proceed.   I discussed the assessment and treatment plan with the patient. The patient was provided an opportunity to ask questions and all were answered. The patient agreed with the plan and demonstrated an understanding of the instructions.   The patient was advised to call back or seek an in-person evaluation if the symptoms worsen or if the condition fails to improve as anticipated.  I provided 15 minutes of non-face-to-face time during this encounter.   Alicia Rackanika N Khira Cudmore, NP    Psychiatric Initial Adult Assessment   Patient Identification: Alicia Phillips MRN:  [id number] Date of Evaluation:  05/15/2020 Referral Source:  Chief Complaint:   Chief Complaint    Anxiety; Depression; Stress     Visit Diagnosis: No diagnosis found.  History of Present Illness: Alicia Phillips 39 year old Caucasian female seen and evaluated via teleassessment.  She reports slight improvement with her mood since attending partial hospitalization programming patient to transition to intensive out programming.  Reported less frequent panic attacks however states she continues to take hydroxyzine 10 mg p.o. as scheduled 4 times daily.  Continues to endorse  symptoms of worries, mainly due todue to returning to work. However, she  Reported that she was was just granted time for her mental health needs.  Alicia Phillips states that her anxiety and depression has been improving day by day.  She continues to deny suicidal or homicidal ideations.  Denies auditory or visual hallucinations.  Patient to transition into intensive outpatient programming on  05/16/2019 2021  Per admission assessment note: Alicia Phillips Cranfordis a 39 y.o. Caucasianfemale presents with worsening depression and anxiety.Patient appears anxious and is tearful throughout this assessment. Alicia Phillips reports she was referred by her general practitioner due to frequent panic and anxiety attacks. She reports she experienced a heart attack which is now caused her to be hypervigilant with her medical history. States her primary care provider prescribes Lamictal and hydroxyzine for mood stabilization. She reports she was diagnosed with depressed, bipolar disorder and borderline personality disorder.She denies previous inpatient admissions. Denies previous suicide or self-harm attempts. Patient reports substance abuse with marijuana and occasional alcohol use socially. Denied any other illicit drugs. Denies history with physical or sexual abuse in the past.  Associated Signs/Symptoms: Depression Symptoms:  depressed mood, difficulty concentrating, anxiety, panic attacks, (Hypo) Manic Symptoms:  Distractibility, Irritable Mood, Anxiety Symptoms:  Excessive Worry, Psychotic Symptoms:  Hallucinations: None PTSD Symptoms: NA  Past Psychiatric History:   Previous Psychotropic Medications: No   Substance Abuse History in the last 12 months:  No.  Consequences of Substance Abuse: NA  Past Medical History:  Past Medical History:  Diagnosis Date  . Anxiety   . Bipolar disorder ([facility])   . CAD (coronary artery disease)    a. LHC 04/05/20: 95% stenosis of mid LAD s/p DES, 30% stenosis of proximal to mid LAD  . Diabetes mellitus type 1 ([facility])   . History of borderline personality disorder   . Hyperlipidemia   . Major depression   . STEMI (ST elevation myocardial infarction) ([facility]) 04/05/2020  . Tobacco use     Past Surgical History:  Procedure Laterality Date  .  CORONARY STENT INTERVENTION N/A 04/05/2020   Procedure: CORONARY STENT INTERVENTION;  Surgeon: Iran Ouch,  MD;  Location: [facility];  Service: Cardiovascular;  Laterality: N/A;  . LEFT HEART CATH AND CORONARY ANGIOGRAPHY N/A 04/05/2020   Procedure: LEFT HEART CATH AND CORONARY ANGIOGRAPHY;  Surgeon: Iran Ouch, MD;  Location: [facility];  Service: Cardiovascular;  Laterality: N/A;    Family Psychiatric History:   Family History:  Family History  Problem Relation Age of Onset  . Depression Father   . Breast cancer Maternal Grandmother   . Depression Brother   . Anxiety disorder Brother   . ADD / ADHD Brother   . Alcohol abuse Brother   . Alcohol abuse Maternal Grandfather     Social History:   Social History   Socioeconomic History  . Marital status: Single    Spouse name: Not on file  . Number of children: Not on file  . Years of education: Not on file  . Highest education level: Not on file  Occupational History  . Not on file  Tobacco Use  . Smoking status: Former Games developer  . Smokeless tobacco: Never Used  . Tobacco comment: quit after  heartattack Sept 2021  Vaping Use  . Vaping Use: Never used  Substance and Sexual Activity  . Alcohol use: Never  . Drug use: Never  . Sexual activity: Not on file  Other Topics Concern  . Not on file  Social History Narrative  . Not on file   Social Determinants of Health   Financial Resource Strain:   . Difficulty of Paying Living Expenses: Not on file  Food Insecurity:   . Worried About Programme researcher, broadcasting/film/video in the Last Year: Not on file  . Ran Out of Food in the Last Year: Not on file  Transportation Needs:   . Lack of Transportation (Medical): Not on file  . Lack of Transportation (Non-Medical): Not on file  Physical Activity:   . Days of Exercise per Week: Not on file  . Minutes of Exercise per Session: Not on file  Stress:   . Feeling of Stress : Not on file  Social Connections:   . Frequency of Communication with Friends and Family: Not on file  . Frequency of Social Gatherings with Friends and  Family: Not on file  . Attends Religious Services: Not on file  . Active Member of Clubs or Organizations: Not on file  . Attends Banker Meetings: Not on file  . Marital Status: Not on file    Additional Social History:  Allergies:   Allergies  Allergen Reactions  . Sulfamethoxazole-Trimethoprim Rash    Metabolic Disorder Labs: Lab Results  Component Value Date   HGBA1C 8.6 (H) 04/06/2020   MPG 200.12 04/06/2020   No results found for: PROLACTIN Lab Results  Component Value Date   CHOL 193 04/06/2020   TRIG 96 04/06/2020   HDL 50 04/06/2020   CHOLHDL 3.9 04/06/2020   VLDL 19 04/06/2020   LDLCALC 124 (H) 04/06/2020   No results found for: TSH  Therapeutic Level Labs: No results found for: LITHIUM No results found for: CBMZ No results found for: VALPROATE  Current Medications: Current Outpatient Medications  Medication Sig Dispense Refill  . ACETAMINOPHEN EXTRA STRENGTH 500 MG tablet Take 500 mg by mouth every 4 (four) hours as needed for mild pain or headache.     . ARIPiprazole (ABILIFY) 5 MG tablet Take 1 tablet (5 mg  total) by mouth daily. 30 tablet 0  . aspirin 81 MG chewable tablet Chew 1 tablet (81 mg total) by mouth daily.    Marland Kitchen atorvastatin (LIPITOR) 80 MG tablet Take 1 tablet (80 mg total) by mouth daily. 90 tablet 3  . Continuous Blood Gluc Sensor (FREESTYLE LIBRE 14 DAY SENSOR) MISC Apply topically as directed.    . doxycycline (VIBRAMYCIN) 50 MG capsule Take 1 capsule (50 mg total) by mouth daily. 30 capsule 0  . hydrOXYzine (ATARAX/VISTARIL) 10 MG tablet Take 1 tablet (10 mg total) by mouth every 4 (four) hours as needed for anxiety. 30 tablet 1  . INPEN 100-PINK-NOVO DEVI 3 (three) times daily.    . insulin aspart (NOVOLOG) 100 UNIT/ML injection Inject 0-20 Units into the skin 3 (three) times daily before meals. Per sliding scale    . lamoTRIgine (LAMICTAL) 100 MG tablet Take 1 tablet (100 mg total) by mouth at bedtime. Take one tablet at  bedtime. 30 tablet 1  . metoprolol succinate (TOPROL-XL) 25 MG 24 hr tablet Take 1 tablet (25 mg total) by mouth daily. Take with or immediately following a meal. (Patient taking differently: Take 25 mg by mouth in the morning and at bedtime. Take with or immediately following a meal.) 90 tablet 3  . nitroGLYCERIN (NITROSTAT) 0.4 MG SL tablet Place 1 tablet (0.4 mg total) under the tongue every 5 (five) minutes as needed for chest pain. 25 tablet 2  . NOVOFINE PEN NEEDLE 32G X 6 MM MISC SMARTSIG:Injection 5 Times Daily    . ticagrelor (BRILINTA) 90 MG TABS tablet Take 1 tablet (90 mg total) by mouth 2 (two) times daily. 60 tablet 11  . TRESIBA FLEXTOUCH 100 UNIT/ML FlexTouch Pen Inject 25 Units into the skin daily.      Current Facility-Administered Medications  Medication Dose Route Frequency Provider Last Rate Last Admin  . iron polysaccharides (NIFEREX) capsule 150 mg  150 mg Oral Daily [facility], [place], [place]        Musculoskeletal:   Psychiatric Specialty Exam: Review of Systems  Last menstrual period 04/29/2020.There is no height or weight on file to calculate BMI.  General Appearance: Casual  Eye Contact:  Good  Speech:  Clear and Coherent  Volume:  Normal  Mood:  Anxious and Depressed  Affect:  Appropriate  Thought Process:  Coherent  Orientation:  Full (Time, Place, and Person)  Thought Content:  Logical  Suicidal Thoughts:  No  Homicidal Thoughts:  No  Memory:  Immediate;   Fair Recent;   Fair  Judgement:  Good  Insight:  Good  Psychomotor Activity:  Normal  Concentration:  Concentration: Fair  Recall:  [employer] of Knowledge:Fair  Language: Good  Akathisia:  No  Handed:  Right  AIMS (if indicated):  Assets:  Communication Skills  ADL's:  Intact  Cognition: WNL  Sleep:  Good   Screenings: PHQ2-9     Cardiac Rehab from 05/11/2020 in [facility] Cardiac and Pulmonary Rehab Counselor from 05/09/2020 in [facility] Counselor from  05/01/2020 in [facility]AL [facility] PARTIAL HOSPITALIZATION PROGRAM Counselor from 04/21/2020 in BEHAVIORAL HEALTH PARTIAL HOSPITALIZATION PROGRAM  PHQ-2 Total Score 3 2 5 5   PHQ-9 Total Score 7 10 13 16       Assessment and Plan:  -Start IOP intensive outpatient program -Continue medication as directed  Treatment plan was reviewed and agreed upon by NP T. and patient Alicia Phillips need for group services     Melvyn Neth,  NP 10/18/202112:24 PM

## 2020-05-15 NOTE — Progress Notes (Signed)
  Virtual Visit via Telephone Note  I connected with Alicia Phillips on @TODAY @ at  8:00 AM EDT by telephone and verified that I am speaking with the correct person using two identifiers.   I discussed the limitations, risks, security and privacy concerns of performing an evaluation and management service by telephone and the availability of in person appointments. I also discussed with the patient that there may be a patient responsible charge related to this service. The patient expressed understanding and agreed to proceed. I discussed the assessment and treatment plan with the patient. The patient was provided an opportunity to ask questions and all were answered. The patient agreed with the plan and demonstrated an understanding of the instructions.   The patient was advised to call back or seek an in-person evaluation if the symptoms worsen or if the condition fails to improve as anticipated.  I provided 20 minutes of non-face-to-face time during this encounter.   Patient ID: Alicia Phillips, female   DOB: 07-17-1981, 39 y.o.   MRN: [id number] As per previous [facility] note states: Pt presents as referral from [facility]. Pt states she was encouraged to seek help from her medical provider after having a panic attack in the provider's office. Pt reports history of MH diagnoses of Bipolar 2, borderline personality disorder, and anxiety. Pt reports mental health struggles growing for at least the last year, but it was managable. Pt shares that on 9/7 she had a heart attack and that was the catalyst to her current issues. Pt reports she feels betrayed by her body and has lost some trust in medical providers during the process. Pt states panic feelings and belief that "I'm going to die soon and there's nothing I can do about it." Pt is tearful throughout session. Pt reports comorbid medical diagnoses of Type 1 diabetes and an autoimmune disorder, as well as her current heart problems. Pt reports after her heart  attack, tests showed a 95% blockage in an artery and a stint was put in. Pt also reports historical struggle with diabetic builemia. Pt reports some passive SI and denies intent or plan. Pt denies AVH. Patient's Currently Reported Symptoms/Problems: Pt reports insomnia, panic, excessive worry, hypervigilance, decreased appetite and energy, depressed mood, tearfulness  Pt transitioned from PHP to MH-IOP today.  Pt continues to be labile.  States she is still struggling with being fearful that she will have another heart-attack.  On a scale of 1-10 (10 being the worst) pt rates her depression a #4 and anxiety a #7.  Denies SI/HI or A/V hallucinations. Reports the PHP groups went well and is looking forward to MH-IOP.  A:  Oriented pt to virtual MH-IOP.  Pt gave verbal consent for treatment, to release chart information to referred providers and to complete any forms if needed.  Pt also gave consent for attending group virtually d/t COVID-19 social distancing restrictions.  Encouraged support groups.  F/U with [facility].  R:  Pt receptive.  11/7, M.Ed,CNA

## 2020-05-15 NOTE — Psych (Signed)
Virtual Visit via Video Note  I connected with Alicia Phillips on 04/19/20 at  9:00 AM EDT by a video enabled telemedicine application and verified that I am speaking with the correct person using two identifiers.   Location: Patient: Patient Home Provider: Clinical Home Office   I discussed the limitations of evaluation and management by telemedicine and the availability of in person appointments. The patient expressed understanding and agreed to proceed.  I discussed the assessment and treatment plan with the patient. The patient was provided an opportunity to ask questions and all were answered. The patient agreed with the plan and demonstrated an understanding of the instructions.   The patient was advised to call back or seek an in-person evaluation if the symptoms worsen or if the condition fails to improve as anticipated.  Pt was provided 240 minutes of non-face-to-face time during this encounter.   Donia Guiles, LCSW    [facility] BH PHP THERAPIST PROGRESS NOTE  Alicia Phillips [id number]  Session Time: 9:00 - 10:00  Participation Level: Active  Behavioral Response: CasualAlertAnxious and Depressed  Type of Therapy: Group Therapy  Treatment Goals addressed: Coping  Interventions: CBT, DBT, Supportive and Reframing  Summary: Clinician led check-in regarding current stressors and situation, and review of patient completed daily inventory. Clinician utilized active listening and empathetic response and validated patient emotions. Clinician facilitated processing group on pertinent issues.   Therapist Response: Alicia Phillips is a 39 y.o. female who presents with anxiety and depression symptoms. Patient arrived within time allowed and reports that she is feeling "optimisitic." Patient rates hermood at [facility] a scale of 1-10 with 10 being great. Pt reportsyesterday went well and she saw her mom and talked to her brother on the phone. Pt states it was difficult for her to  fall asleep. Pt reports struggles with hypervigilance of bodily sensations. Pt able to process. Pt engaged in discussion.     Session Time: 10:00 - 11:00   Participation Level: Active  Behavioral Response: CasualAlertDepressed  Type of Therapy: Group Therapy  Treatment Goals addressed: Coping  Interventions: CBT, DBT, Supportive and Reframing  Summary: [name] led discussion on control and how to separate what we can and cannot control. Group shared struggles they are experiencing and cln challenged them to problem solve what is within control and defocus on what is outside of our control.   Therapist Response: Pt engaged in discussion and is able to provide feedback and support to other group members.      Session Time: 11:00- 12:00  Participation Level:Active  Behavioral Response:CasualAlertDepressed  Type of Therapy: Group therapy  Treatment Goals addressed: Coping  Interventions:Supportive, Reframing  Summary:Spiritual Care group  Therapist Response:Pt engaged in session. See chaplain note      Session Time: 12:00 -1:00  Participation Level:Active  Behavioral Response:CasualAlertDepressed  Type of Therapy:Group therapy  Treatment Goals addressed: Coping  Interventions:Psychosocial skills training, Supportive  Summary:12:00 - 12:50: Occupational Therapy group 12:50 -1:00 Clinician led check-out. Clinician assessed for immediate needs, medication compliance and efficacy, and safety concerns  Therapist Response:12:00 - 12:50: Patient engaged in group. See OT note. 12:50 - 1:00: At check-out, patientrates hermood at [facility] a scale of 1-10 with 10 being great.Pt reports afternoon plans of going to an appointment and seeing her mom and sister. Pt demonstrates some progress as evidenced byparticipation in first group session. Patient denies SI/HI/self-harm at the end of group.    Suicidal/Homicidal: Nowithout  intent/plan  Plan: Pt will continue in PHP while working  to decrease anxiety and depression symptoms, increase emotional regulation, and increase ability to manage symptoms in a healthy manner.   Diagnosis: Severe episode of recurrent major depressive disorder, without psychotic features (HCC) [F33.2]    1. Severe episode of recurrent major depressive disorder, without psychotic features (HCC)   2. GAD (generalized anxiety disorder)      Donia Guiles, LCSW 05/15/2020

## 2020-05-15 NOTE — Progress Notes (Signed)
Virtual Visit via Video Note  I connected with Alicia Phillips on 05/15/20 at  8:00 AM EDT by a video enabled telemedicine application and verified that I am speaking with the correct person using two identifiers.  At orientation to the IOP program, Case Manager discussed the limitations of evaluation and management by telemedicine and the availability of in person appointments. The patient expressed understanding and agreed to proceed with virtual visits throughout the duration of the program.   Location:  Patient: Patient Home Provider: Home Office   History of Present Illness: MDD and GAD  Observations/Objective: Check In: Case Manager checked in with all participants to review discharge dates, insurance authorizations, work-related documents and needs of the treatment team, including medication review and assessment. Case Manager introduced Client to the group, with group members welcoming and starting the joining process.   Initial Therapeutic Activity: Counselor facilitated therapeutic processing with group members to assess mood and current functioning, prompting group members to share about application of skills, progress and challenges in treatment/personal lives. Today is the Client's first group session, sharing what brought her to treatment, current life stressors, who is in her support group and what she is hoping to get out of treatment. Client reports that she recently experienced a medical emergency that has caused her anxiety to increased. Client shared that she has a history of mental illness and family related issues that continue to peri Client presents with moderate depression and moderate anxiety. Client denied any current SI/HI/psychosis. Group members all identified a grounding, relaxation or mindfulness practice for another group member struggling with PTSD. Client shared a grounding technique that works for her.   Second Therapeutic Activity: Counselor prompted group to  compare and contrast their Depression and Anxiety. Counselor gave examples of triggers, body sensations, frequency, duration, thoughts, environments, etc to consider. Group members shared aloud their reflections and connections after creating their lists. Client noted that her depression and anxiety severely overlap, with substance use, shutting down, overwhelmed, catastorphizing, body issues, family, money, career, and relationships. All group members validated others experiences as well. Counselor introduced a video that will be shared in our next session, prompting group members to list the things they are avoiding, as we will discuss how avoidance is related to depression and anxiety.   Check Out: Counselor prompted each group member to share self-care practice or productivity activity they can engage in today. Client plans to work on a project that helps bring order to her life and thinking. Client indicated that they are not currently a risk to self of others before signing off.  Assessment and Plan: Clinician recommends that Client remain in IOP treatment to better manage mental health symptoms, stabilization and to address treatment plan goals. Clinician recommends adherence to crisis/safety plan, taking medications as prescribed, and following up with medical professionals if any issues arise.   Follow Up Instructions: Clinician will send Webex link for next session. The Client was advised to call back or seek an in-person evaluation if the symptoms worsen or if the condition fails to improve as anticipated.     I provided 180 minutes of non-face-to-face time during this encounter.     Hilbert Odor, LCSW

## 2020-05-16 ENCOUNTER — Other Ambulatory Visit (HOSPITAL_COMMUNITY): Payer: BC Managed Care – PPO | Admitting: Psychiatry

## 2020-05-16 ENCOUNTER — Other Ambulatory Visit: Payer: Self-pay

## 2020-05-16 DIAGNOSIS — F3170 Bipolar disorder, currently in remission, most recent episode unspecified: Secondary | ICD-10-CM | POA: Diagnosis not present

## 2020-05-16 DIAGNOSIS — F39 Unspecified mood [affective] disorder: Secondary | ICD-10-CM | POA: Diagnosis not present

## 2020-05-16 DIAGNOSIS — I2510 Atherosclerotic heart disease of native coronary artery without angina pectoris: Secondary | ICD-10-CM | POA: Diagnosis not present

## 2020-05-16 DIAGNOSIS — F419 Anxiety disorder, unspecified: Secondary | ICD-10-CM | POA: Diagnosis not present

## 2020-05-16 DIAGNOSIS — E1065 Type 1 diabetes mellitus with hyperglycemia: Secondary | ICD-10-CM | POA: Diagnosis not present

## 2020-05-16 NOTE — Psych (Signed)
Virtual Visit via Video Note  I connected with Alicia Phillips on 04/24/20 at  9:00 AM EDT by a video enabled telemedicine application and verified that I am speaking with the correct person using two identifiers.   Location: Patient: Patient Home Provider: Clinical Home Office   I discussed the limitations of evaluation and management by telemedicine and the availability of in person appointments. The patient expressed understanding and agreed to proceed.  I discussed the assessment and treatment plan with the patient. The patient was provided an opportunity to ask questions and all were answered. The patient agreed with the plan and demonstrated an understanding of the instructions.   The patient was advised to call back or seek an in-person evaluation if the symptoms worsen or if the condition fails to improve as anticipated.  Pt was provided 240 minutes of non-face-to-face time during this encounter.   Donia Guiles, LCSW    [facility] BH PHP THERAPIST PROGRESS NOTE  Alicia Phillips [id number]  Session Time: 9:00 - 10:00  Participation Level: Active  Behavioral Response: CasualAlertAnxious and Depressed  Type of Therapy: Group Therapy  Treatment Goals addressed: Coping  Interventions: CBT, DBT, Supportive and Reframing  Summary: Clinician led check-in regarding current stressors and situation, and review of patient completed daily inventory. Clinician utilized active listening and empathetic response and validated patient emotions. Clinician facilitated processing group on pertinent issues.   Therapist Response: Alicia Phillips is a 39 y.o. female who presents with anxiety and depression symptoms. Patient arrived within time allowed and reports that she is feeling "tired, but okay." Patient rates hermood at [facility] a scale of 1-10 with 10 being great. Pt reportsher weekend went well and she cleaned and spent time with family. Pt shares she relapsed in her cigarette use after  2.5 weeks and struggled with negative feelings afterward. Pt states struggling with continued high anxiety about having a heart attack. Pt is intermittently tearful. Pt able to process. Pt engaged in discussion.     Session Time: 10:00 - 11:00   Participation Level: Active  Behavioral Response: CasualAlertDepressed  Type of Therapy: Group Therapy  Treatment Goals addressed: Coping  Interventions: CBT, DBT, Supportive and Reframing  Summary: [name] introduced catch-challenge-change, a behavior modification model utilizing CBT strategies. Group discussed how to utilize C-C-C with any habit they are wanting to change. [name] encouraged pt's to start by considering ways to increase awareness as the first step.    Therapist Response: Pt engaged in discussion and reports understanding of C-C-C. Pt shares she can apply the concept to quitting smoking cigarettes.      Session Time: 11:00- 12:00  Participation Level:Active  Behavioral Response:CasualAlertDepressed  Type of Therapy: Group Therapy   Treatment Goals addressed: Coping  Interventions: CBT, DBT, Supportive and Reframing  Summary: [name] introduced DBT distress tolerance skills and discussed how to use the skills and their purpose. Group began discussion of ACCEPTS distraction skills and brainstormed ways to apply the skills in their every day life.   Therapist Response: Pt engaged in discussion and identifies reading and collaging as ways to utilize "A."       Session Time: 12:00 -1:00  Participation Level:Active  Behavioral Response:CasualAlertDepressed  Type of Therapy:Group therapy  Treatment Goals addressed: Coping  Interventions:CBT; Solution focused; Supportive; Reframing  Summary:12:00 - 12:50: [name] led discussion on being able to see and accept alternatives to our way of thinking. Group viewed TED talk "On being wrong" to aid discussion. [name] connected CBT techniques and shifting  perspective as ways to reframe our experiences. [name] encouraged pt's to remember CBT tenet "the problem is not the problem, the way we are thinking about the problem is the problem." 12:50 -1:00 Clinician led check-out. Clinician assessed for immediate needs, medication compliance and efficacy, and safety concerns  Therapist Response:12:00 - 12:50: Pt engaged in discussion and is able to work with group to reframe examples offered. 12:50 - 1:00: At check-out, patientrates hermood at a8.5on a scale of 1-10 with 10 being great.Pt reports afternoon plans of napping and making calls. Pt demonstrates some progress as evidenced byincreased periods of managed mood. Patient denies SI/HI/self-harm at the end of group.    Suicidal/Homicidal: Nowithout intent/plan  Plan: Pt will continue in PHP while working to decrease anxiety and depression symptoms, increase emotional regulation, and increase ability to manage symptoms in a healthy manner.   Diagnosis: GAD (generalized anxiety disorder) [F41.1]    1. GAD (generalized anxiety disorder)   2. Major depressive disorder, recurrent episode, moderate (HCC)      Donia Guiles, LCSW 05/16/2020

## 2020-05-16 NOTE — Psych (Signed)
Virtual Visit via Video Note  I connected with Alicia Phillips on 04/20/20 at  9:00 AM EDT by a video enabled telemedicine application and verified that I am speaking with the correct person using two identifiers.   Location: Patient: Patient Home Provider: Clinical Home Office   I discussed the limitations of evaluation and management by telemedicine and the availability of in person appointments. The patient expressed understanding and agreed to proceed.  I discussed the assessment and treatment plan with the patient. The patient was provided an opportunity to ask questions and all were answered. The patient agreed with the plan and demonstrated an understanding of the instructions.   The patient was advised to call back or seek an in-person evaluation if the symptoms worsen or if the condition fails to improve as anticipated.  Pt was provided 240 minutes of non-face-to-face time during this encounter.   Donia Guiles, LCSW    [facility] BH PHP THERAPIST PROGRESS NOTE  Alicia Phillips [id number]  Session Time: 9:00 - 10:00  Participation Level: Active  Behavioral Response: CasualAlertAnxious and Depressed  Type of Therapy: Group Therapy  Treatment Goals addressed: Coping  Interventions: CBT, DBT, Supportive and Reframing  Summary: Clinician led check-in regarding current stressors and situation, and review of patient completed daily inventory. Clinician utilized active listening and empathetic response and validated patient emotions. Clinician facilitated processing group on pertinent issues.   Therapist Response: Alicia Phillips is a 39 y.o. female who presents with anxiety and depression symptoms. Patient arrived within time allowed and reports that she is feeling "anxious." Patient rates hermood at a4.5on a scale of 1-10 with 10 being great. Pt reportsher blood sugar is high and she is worried about the stint in her heart moving as a result, per a comment made by her  endocrinologist. Pt states her cat is ill, she is stressed about finances, and she had an anxiety dream last night. Pt is tearful. Pt reports continued panic regarding body sensations. Pt able to process. Pt engaged in discussion.     Session Time: 10:00 - 11:00   Participation Level: Active  Behavioral Response: CasualAlertDepressed  Type of Therapy: Group Therapy  Treatment Goals addressed: Coping  Interventions: CBT, DBT, Supportive and Reframing  Summary: [name] led discussion on perspective and our ability to choose the way in which we interpret our reality. [name] utilized CBT to inform discussion and challenged pt's to increase awareness around the lens through which we view our reality.  Therapist Response: Pt engaged in discussion and reports understanding of perspective. Pt is tearful intermittently. Pt reports eagerness to alter perspective and "anything that can help."      Session Time: 11:00- 12:00  Participation Level:Active  Behavioral Response:CasualAlertDepressed  Type of Therapy: Group Therapy, OT  Treatment Goals addressed: Coping  Interventions:Psychosocial skills training, Supportive  Summary:Occupational Therapy group  Therapist Response:Patient engaged in group. See OT note.      Session Time: 12:00 -1:00  Participation Level:Active  Behavioral Response:CasualAlertDepressed  Type of Therapy:Group therapy  Treatment Goals addressed: Coping  Interventions:CBT; Solution focused; Supportive; Reframing  Summary:12:00 - 12:50: [name] introduced topic of boundaries and discussed how they inform the relationships which we have. Group discussed struggles they have due to exhibiting poor boundaries. [name] discussed rigid, porous, and healthy boundaries characteristics and worked to identify the  characteristics in themselves. 12:50 -1:00 Clinician led check-out. Clinician assessed for immediate needs, medication  compliance and efficacy, and safety concerns  Therapist Response:12:00 - 12:50: Pt engaged in  discussion and identifies she has mainly porous boundaries.  12:50 - 1:00: At check-out, patientrates hermood at a6.5on a scale of 1-10 with 10 being great.Pt reports afternoon plans of napping and visiting her sister. Pt demonstrates some progress as evidenced byworking with cln to ground when she escalated during group. Patient denies SI/HI/self-harm at the end of group.    Suicidal/Homicidal: Nowithout intent/plan  Plan: Pt will continue in PHP while working to decrease anxiety and depression symptoms, increase emotional regulation, and increase ability to manage symptoms in a healthy manner.   Diagnosis: GAD (generalized anxiety disorder) [F41.1]    1. GAD (generalized anxiety disorder)   2. Major depressive disorder, recurrent episode, moderate (HCC)      Donia Guiles, LCSW 05/16/2020

## 2020-05-16 NOTE — Psych (Signed)
Virtual Visit via Video Note  I connected with Alicia Phillips on 04/26/20 at  9:00 AM EDT by a video enabled telemedicine application and verified that I am speaking with the correct person using two identifiers.   Location: Patient: Patient Home Provider: Clinical Home Office   I discussed the limitations of evaluation and management by telemedicine and the availability of in person appointments. The patient expressed understanding and agreed to proceed.  I discussed the assessment and treatment plan with the patient. The patient was provided an opportunity to ask questions and all were answered. The patient agreed with the plan and demonstrated an understanding of the instructions.   The patient was advised to call back or seek an in-person evaluation if the symptoms worsen or if the condition fails to improve as anticipated.  Pt was provided 240 minutes of non-face-to-face time during this encounter.   Donia Guiles, LCSW    [facility] BH PHP THERAPIST PROGRESS NOTE  Alicia Phillips [id number]  Session Time: 9:00 - 10:00  Participation Level: Active  Behavioral Response: CasualAlertAnxious and Depressed  Type of Therapy: Group Therapy  Treatment Goals addressed: Coping  Interventions: CBT, DBT, Supportive and Reframing  Summary: Clinician led check-in regarding current stressors and situation, and review of patient completed daily inventory. Clinician utilized active listening and empathetic response and validated patient emotions. Clinician facilitated processing group on pertinent issues.   Therapist Response: Alicia Phillips is a 39 y.o. female who presents with anxiety and depression symptoms. Patient arrived within time allowed and reports that she is feeling "okay." Patient rates hermood at a6.5on a scale of 1-10 with 10 being great. Pt reports she is feeling physically poor due to high blood sugar and is trying to fend off irritability.Pt states she was up 5+ times  last night which negatively impacted her sleep. Pt states she saw a colleague which went well however she struggled with feelings of guilt later due to pt's absence at work.  Pt able to process. Pt engaged in discussion.     Session Time: 10:00 - 11:00   Participation Level: Active  Behavioral Response: CasualAlertDepressed  Type of Therapy: Group Therapy  Treatment Goals addressed: Coping  Interventions: CBT, DBT, Supportive and Reframing  Summary: [name] introduced topic of CBT cognitive distortions. [name] reviewed C-C-C model and discussions regarding perspective. Group members shared ways in which thinking errors may have affected their lives.   Therapist Response: Pt engaged in discussion and reports understanding of distorted thinking and identifies distortions as a problem for her.      Session Time: 11:00- 12:00  Participation Level:Active  Behavioral Response:CasualAlertDepressed  Type of Therapy: Group therapy  Treatment Goals addressed: Coping  Interventions:Supportive, Reframing  Summary:Spiritual Care group  Therapist Response:Pt engaged in session. See chaplain note      Session Time: 12:00 -1:00  Participation Level:Active  Behavioral Response:CasualAlertDepressed  Type of Therapy:Group therapy  Treatment Goals addressed: Coping  Interventions:Psychosocial skills training, Supportive  Summary:12:00 - 12:50: Occupational Therapy group 12:50 -1:00 Clinician led check-out. Clinician assessed for immediate needs, medication compliance and efficacy, and safety concerns  Therapist Response:12:00 - 12:50: Patient engaged in group. See OT note. 12:50 - 1:00: At check-out, patientrates hermood at a8.5on a scale of 1-10 with 10 being great.Pt reports afternoon plans of taking a walk and seeing her family. Pt demonstrates some progress as evidenced bypracticing skills last night. Patient denies SI/HI/self-harm at  the end of group.    Suicidal/Homicidal: Nowithout intent/plan  Plan: Pt  will continue in PHP while working to decrease anxiety and depression symptoms, increase emotional regulation, and increase ability to manage symptoms in a healthy manner.   Diagnosis: Generalized anxiety disorder [F41.1]    1. Generalized anxiety disorder   2. Bipolar II disorder (HCC)      Donia Guiles, LCSW 05/16/2020

## 2020-05-16 NOTE — Psych (Signed)
Virtual Visit via Video Note  I connected with Alicia Phillips on 04/28/20 at  9:00 AM EDT by a video enabled telemedicine application and verified that I am speaking with the correct person using two identifiers.   Location: Patient: Patient Home Provider: Clinical Home Office   I discussed the limitations of evaluation and management by telemedicine and the availability of in person appointments. The patient expressed understanding and agreed to proceed.  I discussed the assessment and treatment plan with the patient. The patient was provided an opportunity to ask questions and all were answered. The patient agreed with the plan and demonstrated an understanding of the instructions.   The patient was advised to call back or seek an in-person evaluation if the symptoms worsen or if the condition fails to improve as anticipated.  Pt was provided 240 minutes of non-face-to-face time during this encounter.   Donia Guiles, LCSW    [facility] BH PHP THERAPIST PROGRESS NOTE  Alicia Phillips [id number]  Session Time: 9:00 - 10:00  Participation Level: Active  Behavioral Response: CasualAlertAnxious and Depressed  Type of Therapy: Group Therapy  Treatment Goals addressed: Coping  Interventions: CBT, DBT, Supportive and Reframing  Summary: Clinician led check-in regarding current stressors and situation, and review of patient completed daily inventory. Clinician utilized active listening and empathetic response and validated patient emotions. Clinician facilitated processing group on pertinent issues.   Therapist Response: Alicia Phillips is a 39 y.o. female who presents with anxiety and depression symptoms. Patient arrived within time allowed and reports that she is feeling "not good." Patient rates hermood at a1.5on a scale of 1-10 with 10 being great. Pt is tearful and shares she received a call this morning that her sister and brother-in-law tested positive for COVID. Pt reports  high anxiety for her own health as well as her family's. Pt states she spent time with her mom yesterday and spent time with creative outlets. Pt able to process. Pt engaged in discussion.     Session Time: 10:00 - 11:00   Participation Level: Active  Behavioral Response: CasualAlertDepressed  Type of Therapy: Group Therapy  Treatment Goals addressed: Coping  Interventions: CBT, DBT, Supportive and Reframing  Summary: [name] led discussion on DBT's wise mind and how to balance feeling and reason. Group members discussed current struggles with the state of the world and how it affects their mental and emotional state. [name] brought in topics of perception, CBT thought triangle, DBT distress tolerance, and stress management. [name] encouraged pt's to ask: "what purpose is this feeling serving" and seek to respond accordingly.   Therapist Response: Pt engaged in discussion and reports struggles with emotionally spiraling regarding the pandemic and current public health crisis. Pt is able to process and share feelings and concerns.     Session Time: 11:00- 12:00  Participation Level:Active  Behavioral Response:CasualAlertDepressed  Type of Therapy: Group Therapy, OT  Treatment Goals addressed: Coping  Interventions:Psychosocial skills training, Supportive  Summary:Occupational Therapy group  Therapist Response:Patient engaged in group. See OT note.      Session Time: 12:00 -1:00  Participation Level:Active  Behavioral Response:CasualAlertDepressed  Type of Therapy:Group therapy  Treatment Goals addressed: Coping  Interventions:CBT; Solution focused; Supportive; Reframing  Summary:12:00 - 12:50: [name] continued topic of CBT cognitive distortions and reviewed. Group utilized handout "Unhealthy thought patterns" to continue discussion of examples for distorted thoughts. Group members shared examples from their own lives and discussed the  negative impact of these thought patterns on their perception. 12:50 -  1:00 Clinician led check-out. Clinician assessed for immediate needs, medication compliance and efficacy, and safety concerns  Therapist Response:12:00 - 12:50:  Pt engaged in discussion and identified false permanence and fallacy of fairness as most problematic.  12:50 - 1:00: At check-out, patientrates hermood at [facility] a scale of 1-10 with 10 being great.Pt reports afternoon plans of taking a drive and planning a COVID test. Pt demonstrates some progress as evidenced byallowing group to support her and practice in session. Patient denies SI/HI/self-harm at the end of group.    Suicidal/Homicidal: Nowithout intent/plan  Plan: Pt will continue in PHP while working to decrease anxiety and depression symptoms, increase emotional regulation, and increase ability to manage symptoms in a healthy manner.   Diagnosis: Generalized anxiety disorder [F41.1]    1. Generalized anxiety disorder   2. Bipolar II disorder (HCC)      Donia Guiles, LCSW 05/16/2020

## 2020-05-16 NOTE — Psych (Signed)
Virtual Visit via Video Note  I connected with CRESSIDA MILFORD on 04/21/20 at  9:00 AM EDT by a video enabled telemedicine application and verified that I am speaking with the correct person using two identifiers.   Location: Patient: Patient Home Provider: Clinical Home Office   I discussed the limitations of evaluation and management by telemedicine and the availability of in person appointments. The patient expressed understanding and agreed to proceed.  I discussed the assessment and treatment plan with the patient. The patient was provided an opportunity to ask questions and all were answered. The patient agreed with the plan and demonstrated an understanding of the instructions.   The patient was advised to call back or seek an in-person evaluation if the symptoms worsen or if the condition fails to improve as anticipated.  Pt was provided 240 minutes of non-face-to-face time during this encounter.   Donia Guiles, LCSW    [facility] BH PHP THERAPIST PROGRESS NOTE  AULANI SHIPTON [id number]  Session Time: 9:00 - 10:00  Participation Level: Active  Behavioral Response: CasualAlertAnxious and Depressed  Type of Therapy: Group Therapy  Treatment Goals addressed: Coping  Interventions: CBT, DBT, Supportive and Reframing  Summary: Clinician led check-in regarding current stressors and situation, and review of patient completed daily inventory. Clinician utilized active listening and empathetic response and validated patient emotions. Clinician facilitated processing group on pertinent issues.   Therapist Response: JYSSICA RIEF is a 39 y.o. female who presents with anxiety and depression symptoms. Patient arrived within time allowed and reports that she is feeling "okay." Patient rates hermood at a7.5on a scale of 1-10 with 10 being great. Pt reportsshe is feeling more stable today. Pt reports struggling with irritability yesterday afternoon and had moments of panic  regarding body sensations. Pt reports talking to friends helped calm her. Pt continues to be intermittently tearful. Pt able to process. Pt engaged in discussion.     Session Time: 10:00 - 11:00   Participation Level: Active  Behavioral Response: CasualAlertDepressed  Type of Therapy: Group Therapy  Treatment Goals addressed: Coping  Interventions: CBT, DBT, Supportive and Reframing  Summary: [name] led discussion on motivation and how to address it when we are feeling a-motivated. [name] provided context for low motivation as a depression symptom and avoidance technique for anxiety. Group discussed strategies for alleviating depression and anxiety as way to address lack of motivation as well as adjusting standards as a way to shift perspective and offer kindness to ourselves.   Therapist Response: Pt engaged in discussion and reports motivation has been a struggle for her and connects it most to anxiety. Pt reports desire to be kinder to herself and admits it is a struggle.     Session Time: 11:00- 12:00  Participation Level:Active  Behavioral Response:CasualAlertDepressed  Type of Therapy: Group Therapy, OT  Treatment Goals addressed: Coping  Interventions:Psychosocial skills training, Supportive  Summary:Occupational Therapy group  Therapist Response:Patient engaged in group. See OT note.      Session Time: 12:00 -1:00  Participation Level:Active  Behavioral Response:CasualAlertDepressed  Type of Therapy:Group therapy  Treatment Goals addressed: Coping  Interventions:CBT; Solution focused; Supportive; Reframing  Summary:12:00 - 12:50: [name] continued topic of boundaries and discussed types of boundaries: physical, emotional, intellectual, sexual, material, and time. Group shared ways in which they struggle with each type of boundary and specific issues they encounter.  12:50 -1:00 Clinician led check-out. Clinician assessed for  immediate needs, medication compliance and efficacy, and safety concerns  Therapist Response:12:00 -  12:50: Pt engaged in discussion and reports struggling most with time boundaries.   12:50 - 1:00: At check-out, patientrates hermood at a7.5on a scale of 1-10 with 10 being great.Pt reports afternoon plans of napping and seeing her sister. Pt demonstrates some progress as evidenced byattempting to apply skills outside of group. Patient denies SI/HI/self-harm at the end of group.    Suicidal/Homicidal: Nowithout intent/plan  Plan: Pt will continue in PHP while working to decrease anxiety and depression symptoms, increase emotional regulation, and increase ability to manage symptoms in a healthy manner.   Diagnosis: Generalized anxiety disorder [F41.1]    1. Generalized anxiety disorder   2. Major depressive disorder, recurrent episode, moderate (HCC)      Donia Guiles, LCSW 05/16/2020

## 2020-05-16 NOTE — Psych (Signed)
Virtual Visit via Video Note  I connected with Alicia Phillips on 04/25/20 at  9:00 AM EDT by a video enabled telemedicine application and verified that I am speaking with the correct person using two identifiers.   Location: Patient: Patient Home Provider: Clinical Home Office   I discussed the limitations of evaluation and management by telemedicine and the availability of in person appointments. The patient expressed understanding and agreed to proceed.  I discussed the assessment and treatment plan with the patient. The patient was provided an opportunity to ask questions and all were answered. The patient agreed with the plan and demonstrated an understanding of the instructions.   The patient was advised to call back or seek an in-person evaluation if the symptoms worsen or if the condition fails to improve as anticipated.  Pt was provided 240 minutes of non-face-to-face time during this encounter.   Donia Guiles, LCSW    [facility] BH PHP THERAPIST PROGRESS NOTE  Alicia Phillips  Session Time: 9:00 - 10:00  Participation Level: Active  Behavioral Response: CasualAlertAnxious and Depressed  Type of Therapy: Group Therapy  Treatment Goals addressed: Coping  Interventions: CBT, DBT, Supportive and Reframing  Summary: Clinician led check-in regarding current stressors and situation, and review of patient completed daily inventory. Clinician utilized active listening and empathetic response and validated patient emotions. Clinician facilitated processing group on pertinent issues.   Therapist Response: Alicia Phillips is a 39 y.o. female who presents with anxiety and depression symptoms. Patient arrived within time allowed and reports that she is feeling "okay." Patient rates hermood at [facility] a scale of 1-10 with 10 being great. Pt reportsemotional lability over the evening and anxiety that presented as irritability. Pt states she spent time with her mom so she  wasn't alone. Pt continues to be intermittently tearful. Pt states she is struggling with managing her moods. Pt able to process. Pt engaged in discussion.     Session Time: 10:00 - 11:00   Participation Level: Active  Behavioral Response: CasualAlertDepressed  Type of Therapy: Group Therapy  Treatment Goals addressed: Coping  Interventions: CBT, DBT, Supportive and Reframing  Summary: [name] continued topic of DBT distress tolerance skills and the ACCEPT skills. Group reviewed C-E skills and discussed how to apply them to their every day life. Group viewed TED talk on laughing yoga as an illustration of "E" skill and practiced the exercise.   Therapist Response: Pt engaged in activity and discussion and is able to identify 3 ways to practice "E" skill.     Session Time: 11:00- 12:00  Participation Level:Active  Behavioral Response:CasualAlertDepressed  Type of Therapy: Group Therapy, OT  Treatment Goals addressed: Coping  Interventions:Psychosocial skills training, Supportive  Summary:Occupational Therapy group  Therapist Response:Patient engaged in group. See OT note.      Session Time: 12:00 -1:00  Participation Level:Active  Behavioral Response:CasualAlertDepressed  Type of Therapy:Group therapy  Treatment Goals addressed: Coping  Interventions:CBT; Solution focused; Supportive; Reframing  Summary:12:00 - 12:50: [name] continued topic of DBT distress tolerance skills and the ACCEPT skills. Group reviewed P-P-T-S skills and how to apply them in their every day life.  12:50 -1:00 Clinician led check-out. Clinician assessed for immediate needs, medication compliance and efficacy, and safety concerns  Therapist Response:12:00 - 12:50: Pt engaged in discussion and reports she is most likely to practice "T" skill.   12:50 - 1:00: At check-out, patientrates hermood at a7.5on a scale of 1-10 with 10 being great.Pt reports  afternoon plans of  going on a walk and seeing a colleague. Pt demonstrates some progress as evidenced byincreasing awareness of triggers. Patient denies SI/HI/self-harm at the end of group.    Suicidal/Homicidal: Nowithout intent/plan  Plan: Pt will continue in PHP while working to decrease anxiety and depression symptoms, increase emotional regulation, and increase ability to manage symptoms in a healthy manner.   Diagnosis: Generalized anxiety disorder [F41.1]    1. Generalized anxiety disorder   2. Bipolar II disorder (HCC)      Donia Guiles, LCSW 05/16/2020

## 2020-05-16 NOTE — Psych (Signed)
Virtual Visit via Video Note  I connected with Alicia Phillips on 04/27/20 at  9:00 AM EDT by a video enabled telemedicine application and verified that I am speaking with the correct person using two identifiers.   Location: Patient: Patient Home Provider: Clinical Home Office   I discussed the limitations of evaluation and management by telemedicine and the availability of in person appointments. The patient expressed understanding and agreed to proceed.  I discussed the assessment and treatment plan with the patient. The patient was provided an opportunity to ask questions and all were answered. The patient agreed with the plan and demonstrated an understanding of the instructions.   The patient was advised to call back or seek an in-person evaluation if the symptoms worsen or if the condition fails to improve as anticipated.  Pt was provided 240 minutes of non-face-to-face time during this encounter.   Donia Guiles, LCSW    [facility] BH PHP THERAPIST PROGRESS NOTE  Alicia Phillips [id number]  Session Time: 9:00 - 10:00  Participation Level: Active  Behavioral Response: CasualAlertAnxious and Depressed  Type of Therapy: Group Therapy  Treatment Goals addressed: Coping  Interventions: CBT, DBT, Supportive and Reframing  Summary: Clinician led check-in regarding current stressors and situation, and review of patient completed daily inventory. Clinician utilized active listening and empathetic response and validated patient emotions. Clinician facilitated processing group on pertinent issues.   Therapist Response: Alicia Phillips is a 39 y.o. female who presents with anxiety and depression symptoms. Patient arrived within time allowed and reports that she is feeling "not great." Patient rates hermood at [facility] a scale of 1-10 with 10 being great. Pt reportsshe is feeling poor physically as her blood sugar continues to run high in the morning. Pt reports continued struggles with  hypervigilance regarding her body and medical anxiety. Pt reports yesterday went well and she went to a coffee shop and engaged in art which was distracting for her. Pt able to process. Pt engaged in discussion.     Session Time: 10:00 - 11:00   Participation Level: Active  Behavioral Response: CasualAlertDepressed  Type of Therapy: Group Therapy  Treatment Goals addressed: Coping  Interventions: CBT, DBT, Supportive and Reframing  Summary: [name] led discussion on feeling like a burden. Group members shared struggles they experience with feeling indebted to others or like they are "too much." [name] introduced "best friend test" and challenged group members to extend kindness to themselves as they would a loved one.   Therapist Response: Pt engaged in discussion and reports guilt regarding stress she may cause to her support system and her coworkers. Pt is tearful intermittently through session. Pt is able to process. Pt is able to make a kindness statement towards herself.      Session Time: 11:00- 12:00  Participation Level:Active  Behavioral Response:CasualAlertDepressed  Type of Therapy: Group Therapy, OT  Treatment Goals addressed: Coping  Interventions:Psychosocial skills training, Supportive  Summary:Occupational Therapy group  Therapist Response:Patient engaged in group. See OT note.      Session Time: 12:00 -1:00  Participation Level:Active  Behavioral Response:CasualAlertDepressed  Type of Therapy:Group therapy  Treatment Goals addressed: Coping  Interventions:CBT; Solution focused; Supportive; Reframing  Summary:12:00 - 12:50: [name] continued topic of CBT cognitive distortions and group reviewed common distortions examples, utilizing handout "Cognitive distortions" to aid discussion. Group discussed personal examples of each common distortion to increase awareness to be able to "catch" the thought.  12:50 -1:00 Clinician  led check-out. Clinician assessed for immediate needs,  medication compliance and efficacy, and safety concerns  Therapist Response:12:00 - 12:50:  Pt engaged in discussion and identifies catastrophizing and mind reading as particularly problematic for her.   12:50 - 1:00: At check-out, patientrates hermood at a7.5on a scale of 1-10 with 10 being great.Pt reports afternoon plans of taking a nap and doing collage. Pt demonstrates some progress as evidenced byintentional effort to practice skills at home. Patient denies SI/HI/self-harm at the end of group.    Suicidal/Homicidal: Nowithout intent/plan  Plan: Pt will continue in PHP while working to decrease anxiety and depression symptoms, increase emotional regulation, and increase ability to manage symptoms in a healthy manner.   Diagnosis: Generalized anxiety disorder [F41.1]    1. Generalized anxiety disorder   2. Bipolar II disorder (HCC)      Donia Guiles, LCSW 05/16/2020

## 2020-05-17 ENCOUNTER — Encounter (HOSPITAL_COMMUNITY): Payer: Self-pay

## 2020-05-17 ENCOUNTER — Other Ambulatory Visit: Payer: Self-pay

## 2020-05-17 ENCOUNTER — Other Ambulatory Visit (HOSPITAL_COMMUNITY): Payer: BC Managed Care – PPO | Admitting: Psychiatry

## 2020-05-17 DIAGNOSIS — Z955 Presence of coronary angioplasty implant and graft: Secondary | ICD-10-CM | POA: Diagnosis not present

## 2020-05-17 DIAGNOSIS — F3181 Bipolar II disorder: Secondary | ICD-10-CM

## 2020-05-17 DIAGNOSIS — F419 Anxiety disorder, unspecified: Secondary | ICD-10-CM | POA: Diagnosis not present

## 2020-05-17 DIAGNOSIS — Z87891 Personal history of nicotine dependence: Secondary | ICD-10-CM | POA: Diagnosis not present

## 2020-05-17 DIAGNOSIS — Z79899 Other long term (current) drug therapy: Secondary | ICD-10-CM | POA: Diagnosis not present

## 2020-05-17 DIAGNOSIS — F439 Reaction to severe stress, unspecified: Secondary | ICD-10-CM | POA: Diagnosis not present

## 2020-05-17 DIAGNOSIS — R4589 Other symptoms and signs involving emotional state: Secondary | ICD-10-CM

## 2020-05-17 DIAGNOSIS — F332 Major depressive disorder, recurrent severe without psychotic features: Secondary | ICD-10-CM | POA: Diagnosis not present

## 2020-05-17 DIAGNOSIS — I214 Non-ST elevation (NSTEMI) myocardial infarction: Secondary | ICD-10-CM | POA: Diagnosis not present

## 2020-05-17 LAB — GLUCOSE, CAPILLARY
Glucose-Capillary: 178 mg/dL — ABNORMAL HIGH (ref 70–99)
Glucose-Capillary: 156 mg/dL — ABNORMAL HIGH (ref 70–99)

## 2020-05-17 NOTE — Progress Notes (Signed)
Daily Session Note  Patient Details  Name: ANNEL ZUNKER MRN: [id number] Date of Birth: 24-May-1981 Referring Provider:    Encounter Date: 05/17/2020  Check In:  Session Check In - 05/17/20 1653      Check-In   Supervising physician immediately available to respond to emergencies See telemetry face sheet for immediately available ER MD    Location [facility]    Staff Present Renita Papa, RN BSN;Joseph Lou Miner, [place] Exercise Physiologist;Melissa Caiola RDN, Luther Redo, MPA, RN    Virtual Visit No    Medication changes reported     No    Fall or balance concerns reported    No    Tobacco Cessation Use Increase    Current number of cigarettes/nicotine per day     18    Warm-up and Cool-down Performed on first and last piece of equipment    Resistance Training Performed Yes    VAD Patient? No    PAD/SET Patient? No      Pain Assessment   Currently in Pain? No/denies              Social History   Tobacco Use  Smoking Status Former Smoker  Smokeless Tobacco Never Used  Tobacco Comment   quit after  heartattack Sept 2021    Goals Met:  Independence with exercise equipment Exercise tolerated well No report of cardiac concerns or symptoms Strength training completed today  Goals Unmet:  Not Applicable  Comments: First full day of exercise!  Patient was oriented to gym and equipment including functions, settings, policies, and procedures.  Patient's individual exercise prescription and treatment plan were reviewed.  All starting workloads were established based on the results of the 6 minute walk test done at initial orientation visit.  The plan for exercise progression was also introduced and progression will be customized based on patient's performance and goals.   Dr. Deyjah Filbert is Medical Director for [place] and [facility].

## 2020-05-17 NOTE — Progress Notes (Signed)
Virtual Visit via Video Note  I connected with SHARLETTE JANSMA on 05/17/20 at  8:00 AM EDT by a video enabled telemedicine application and verified that I am speaking with the correct person using two identifiers.  At orientation to the IOP program, Case Manager discussed the limitations of evaluation and management by telemedicine and the availability of in person appointments. The patient expressed understanding and agreed to proceed with virtual visits throughout the duration of the program.   Location:  Patient: Patient Home Provider: Home Office   History of Present Illness: Bipolar 2 DO and Difficulty coping  Observations/Objective: Check In: Case Manager checked in with all participants to review discharge dates, insurance authorizations, work-related documents and needs of the treatment team, including medication review and assessment. Case Manager introduced a client to the group, with group members welcoming and starting the joining process. Counselor facilitated therapeutic processing with group members to assess mood and current functioning, prompting group members to share about application of skills, progress and challenges in treatment/personal lives. Client reports that she had a positive initial appointment with her new psychiatrist yesterday. She discussed joining with them and being able to advocate for her needs. Client additionally met with the Dr. Parks Ranger recently saved her life in relation to her heart attack. She shared that she was able to thank him in person and how good that felt. Client is looking forward to starting cardiac rehab today, having a positive outlook on the support she will have. Client anticipates utilizing coping strategies to reduce anxiety related to body sensations. Client presents with moderate depression and moderate anxiety. Client denied any current SI/HI/psychosis.   Initial Therapeutic Activity: Counselor introduced our guest speaker, Einar Grad,  [facility] Pharmacist, who shared about psychiatric medications, side effects, treatment considerations and how to communicate with medical professionals. Group Members asked questions and shared medication concerns. Counselor prompted group members to reference a worksheet called, "Body Scan" to jot down questions and concerns about their physical health in preparation for their upcoming appointments with medical professionals. Client noted issues related to diabetes, medications and heart condition. Counselor encouraged routine medical check-ups, preparing for appointments, following up with recommendations and seeking specialist if needed.   Second Therapeutic Activity: Counselor introduced Agricultural consultant, Demetra Shiner, Director of Wellness to present information on [employer] and Benefits. Clients engaged in activities and discussion, personalizing the information to their individual circumstances. Client indicated motivation to make small changes in wellness practices to improve overall mental health.  Check Out: Counselor closed program by allowing time to celebrate a graduating group member. Counselor shared reflections on progress and allow space for group members to share well wishes and appreciates to the graduating client. Counselor prompted graduating client to share takeaways, reflect on progress and final thoughts for the group. Client indicated that they are not currently a risk to self of others before signing off.  Assessment and Plan: Clinician recommends that Client remain in IOP treatment to better manage mental health symptoms, stabilization and to address treatment plan goals. Clinician recommends adherence to crisis/safety plan, taking medications as prescribed, and following up with medical professionals if any issues arise.   Follow Up Instructions: Clinician will send Webex link for next session. The Client was advised to call back or seek an in-person evaluation if the symptoms  worsen or if the condition fails to improve as anticipated.     I provided 180 minutes of non-face-to-face time during this encounter.     Lise Auer, LCSW

## 2020-05-18 ENCOUNTER — Other Ambulatory Visit (HOSPITAL_COMMUNITY): Payer: BC Managed Care – PPO | Admitting: Psychiatry

## 2020-05-18 ENCOUNTER — Other Ambulatory Visit: Payer: Self-pay

## 2020-05-18 ENCOUNTER — Encounter (HOSPITAL_COMMUNITY): Payer: Self-pay

## 2020-05-18 ENCOUNTER — Encounter: Payer: BC Managed Care – PPO | Admitting: *Deleted

## 2020-05-18 DIAGNOSIS — Z79899 Other long term (current) drug therapy: Secondary | ICD-10-CM | POA: Diagnosis not present

## 2020-05-18 DIAGNOSIS — I214 Non-ST elevation (NSTEMI) myocardial infarction: Secondary | ICD-10-CM

## 2020-05-18 DIAGNOSIS — F3181 Bipolar II disorder: Secondary | ICD-10-CM

## 2020-05-18 DIAGNOSIS — F439 Reaction to severe stress, unspecified: Secondary | ICD-10-CM | POA: Diagnosis not present

## 2020-05-18 DIAGNOSIS — Z955 Presence of coronary angioplasty implant and graft: Secondary | ICD-10-CM

## 2020-05-18 DIAGNOSIS — F419 Anxiety disorder, unspecified: Secondary | ICD-10-CM | POA: Diagnosis not present

## 2020-05-18 DIAGNOSIS — Z87891 Personal history of nicotine dependence: Secondary | ICD-10-CM | POA: Diagnosis not present

## 2020-05-18 DIAGNOSIS — F332 Major depressive disorder, recurrent severe without psychotic features: Secondary | ICD-10-CM | POA: Diagnosis not present

## 2020-05-18 LAB — GLUCOSE, CAPILLARY
Glucose-Capillary: 200 mg/dL — ABNORMAL HIGH (ref 70–99)
Glucose-Capillary: 207 mg/dL — ABNORMAL HIGH (ref 70–99)

## 2020-05-18 NOTE — Progress Notes (Signed)
Daily Session Note  Patient Details  Name: Alicia Phillips MRN: [id number] Date of Birth: 07-06-1981 Referring Provider:    Encounter Date: 05/18/2020  Check In:  Session Check In - 05/18/20 1600      Check-In   Supervising physician immediately available to respond to emergencies See telemetry face sheet for immediately available ER MD    Location [facility]    Staff Present Renita Papa, RN BSN;Joseph [address] [place], [place], [place], CCRP, [place], [place], ACSM CEP, Exercise Physiologist    Virtual Visit No    Medication changes reported     No    Fall or balance concerns reported    No    Tobacco Cessation Use Decreased    Current number of cigarettes/nicotine per day     4    Warm-up and Cool-down Performed on first and last piece of equipment    Resistance Training Performed No    VAD Patient? No    PAD/SET Patient? No      Pain Assessment   Currently in Pain? No/denies              Social History   Tobacco Use  Smoking Status Former Smoker  Smokeless Tobacco Never Used  Tobacco Comment   quit after  heartattack Sept 2021    Goals Met:  Independence with exercise equipment Exercise tolerated well No report of cardiac concerns or symptoms Strength training completed today  Goals Unmet:  Not Applicable  Comments: Pt able to follow exercise prescription today without complaint.  Will continue to monitor for progression.    Dr. Myliah Filbert is Medical Director for [facility] and [facility].

## 2020-05-18 NOTE — Progress Notes (Signed)
Virtual Visit via Video Note  I connected with Alicia Phillips on 05/18/20 at  8:00 AM EDT by a video enabled telemedicine application and verified that I am speaking with the correct person using two identifiers.  At orientation to the IOP program, Case Manager discussed the limitations of evaluation and management by telemedicine and the availability of in person appointments. The patient expressed understanding and agreed to proceed with virtual visits throughout the duration of the program.   Location:  Patient: Patient Home Provider: Home Office   History of Present Illness: Bipolar II Disorder  Observations/Objective: Check In: Case Manager checked in with all participants to review discharge dates, insurance authorizations, work-related documents and needs of the treatment team, including medication review and assessment. Case Manager introduced a client to the group, with group members welcoming and starting the joining process. Counselor facilitated therapeutic processing with group members to assess mood and current functioning, prompting group members to share about application of skills, progress and challenges in treatment/personal lives. Client reports that she attended first cardiac rehab, which caused her some anxiety- in fear that she will spark another heart attack. Client reports using breathing and mindfulness skills to cope during session, however, she became anxious upon her return home due to a variety of physical sensations that were concerning to her. Client able to rationally talk about experience and know that she is ok. Client presents with mild depression and moderate anxiety. Client denied any current SI/HI/psychosis.   Initial Therapeutic Activity: Counselor introduced [employer], [place] Chaplain to present information and discussion on Grief and Loss. Group members engaged in discussion, sharing how grief impacts them, what comforts them, what emotions are felt,  labeling losses, etc. After guest speaker logged off, Counselor prompted group to spend 10-15 minutes journaling to process personal grief and loss situations. Counselor processed entries with group and client's identified areas for additional processing in individual therapy. Client noted that she believes she has lived in grief since childhood. She states that she is learning to acknowledge and attend to the feelings better.    Second Therapeutic Activity: Counselor presented psychoeducation on the Power of Secretary/administrator. Counselor reviewed an article which included tips and strategies on implementing gratitude into everyday practices. Counselor shared a video giving examples of gratitude statements. Group Members shared which statements resonated most with them. Client resonated with the lines about letting go of the past and living in the present, to live with passion and to live in others truths.   Check Out: Counselor closed program by allowing time to celebrate a graduating group member. Counselor shared reflections on progress and allow space for group members to share well wishes and appreciates to the graduating client. Counselor prompted graduating client to share takeaways, reflect on progress and final thoughts for the group. Client indicated that they are not currently a risk to self of others before signing off.  Assessment and Plan: Clinician recommends that Client remain in IOP treatment to better manage mental health symptoms, stabilization and to address treatment plan goals. Clinician recommends adherence to crisis/safety plan, taking medications as prescribed, and following up with medical professionals if any issues arise.   Follow Up Instructions: Clinician will send Webex link for next session. The Client was advised to call back or seek an in-person evaluation if the symptoms worsen or if the condition fails to improve as anticipated.     I provided 180  minutes of non-face-to-face time during this encounter.  Lise Auer, LCSW

## 2020-05-19 ENCOUNTER — Other Ambulatory Visit: Payer: Self-pay

## 2020-05-19 ENCOUNTER — Encounter (HOSPITAL_COMMUNITY): Payer: Self-pay

## 2020-05-19 ENCOUNTER — Other Ambulatory Visit (HOSPITAL_COMMUNITY): Payer: BC Managed Care – PPO | Admitting: Family

## 2020-05-19 DIAGNOSIS — F419 Anxiety disorder, unspecified: Secondary | ICD-10-CM | POA: Diagnosis not present

## 2020-05-19 DIAGNOSIS — F439 Reaction to severe stress, unspecified: Secondary | ICD-10-CM | POA: Diagnosis not present

## 2020-05-19 DIAGNOSIS — F332 Major depressive disorder, recurrent severe without psychotic features: Secondary | ICD-10-CM | POA: Diagnosis not present

## 2020-05-19 DIAGNOSIS — Z79899 Other long term (current) drug therapy: Secondary | ICD-10-CM | POA: Diagnosis not present

## 2020-05-19 DIAGNOSIS — F3181 Bipolar II disorder: Secondary | ICD-10-CM

## 2020-05-19 MED ORDER — ARIPIPRAZOLE 10 MG PO TABS: 10.0000 mg | ORAL_TABLET | Freq: Every day | ORAL | 2 refills | Status: DC

## 2020-05-19 NOTE — Progress Notes (Signed)
Virtual Visit via Video Note  I connected with Alicia Phillips on 05/19/20 at  9:00 AM EDT by a video enabled telemedicine application and verified that I am speaking with the correct person using two identifiers.  At orientation to the IOP program, Case Manager discussed the limitations of evaluation and management by telemedicine and the availability of in person appointments. The patient expressed understanding and agreed to proceed with virtual visits throughout the duration of the program.   Location:  Patient: Patient Home Provider: Home Office   History of Present Illness: Bipolar 2  Observations/Objective: Check In: Case Manager checked in with all participants to review discharge dates, insurance authorizations, work-related documents and needs for the treatment team. Counselor facilitated a check-in with group members to assess mood and current functioning. Client reports that she is making progress in rehab which calms her anxities and boosts her self-esteem. Client is feeling more hopeful about her overall recovery. Client states that she needs and appreciates positive affirmations and reassurance from providers. Client presents with moderate depression and moderate anxiety. Client denied any current SI/HI/psychosis.    Initial Therapeutic Activity: Counselor prompted group members to share coping skills and resources they personally use to manage/stabilize/improve their mental health. Group members shared useful apps, websites, sensory items, books and ideas with group. Client participated in sharing and discussion.   Second Therapeutic Activity: Counselor provided group with an exhaustive list of community, state and national mental health, substance abuse and basic need resources. Counselor prompted group members to save numbers in their phones and to bookmark links for future use. Counselor prompted each group member to share one resource they will utilize or check in to over the  next week. Client shared that they were most interested in the [employer].  Check Out: Counselor prompted group members to share one thing they could do for self-care or for fun over the weekend. Client plans to do a DIY project with a friend. Counselor closed program by allowing time to celebrate a graduating group member. Counselor shared reflections on progress and allow space for group members to share well wishes and appreciates to the graduating client. Counselor prompted graduating client to share takeaways, reflect on progress and final thoughts for the group. Client indicated that they are not currently a risk to self of others before signing off.  Assessment and Plan: Clinician recommends that Client remain in IOP treatment to better manage mental health symptoms, stabilization and to address treatment plan goals. Clinician recommends adherence to crisis/safety plan, taking medications as prescribed, and following up with medical professionals if any issues arise.   Follow Up Instructions: Clinician will send Webex link for next session. The Client was advised to call back or seek an in-person evaluation if the symptoms worsen or if the condition fails to improve as anticipated.     I provided 180 minutes of non-face-to-face time during this encounter.     Hilbert Odor, LCSW

## 2020-05-19 NOTE — Progress Notes (Signed)
Virtual Visit via Video Note  I connected with NITZIA PERREN on 05/19/20 at  9:00 AM EDT by a video enabled telemedicine application and verified that I am speaking with the correct person using two identifiers.  Location: Patient: Alicia Phillips Provider: T.Isabelle Matt NP   I discussed the limitations of evaluation and management by telemedicine and the availability of in person appointments. The patient expressed understanding and agreed to proceed.   I discussed the assessment and treatment plan with the patient. The patient was provided an opportunity to ask questions and all were answered. The patient agreed with the plan and demonstrated an understanding of the instructions.   The patient was advised to call back or seek an in-person evaluation if the symptoms worsen or if the condition fails to improve as anticipated.  I provided 15 minutes of non-face-to-face time during this encounter.   [name], NP   [facility] MD/PA/NP OP Progress Note  05/19/2020 9:52 AM LILIANNE DELAIR  MRN:  [id number]  Evaluation: Fraida was seen and evaluated via webex.  Continues to express mood irritability, increasing depression and tearfulness.  Denies suicidal or homicidal.  Reports struggling with emotions.  Discussed utilizing additional coping skills that was learned during partial hospitalization program.  She reports she is taking Lamictal, Abilify and hydroxyzine as directed.  Denied any medication side effects.  Discussed titration to Abilify 5 mg to 10 mg daily.  Patient was receptive to plan.  Continue intensive outpatient programming. Support encouragement reassurance was provided.  Visit Diagnosis: No diagnosis found.  Past Psychiatric History:   Past Medical History:  Past Medical History:  Diagnosis Date  . Anxiety   . Bipolar disorder (HCC)   . CAD (coronary artery disease)    a. LHC 04/05/20: 95% stenosis of mid LAD s/p DES, 30% stenosis of proximal to mid LAD  . Diabetes mellitus  type 1 (HCC)   . History of borderline personality disorder   . Hyperlipidemia   . Major depression   . STEMI (ST elevation myocardial infarction) (HCC) 04/05/2020  . Tobacco use     Past Surgical History:  Procedure Laterality Date  . CORONARY STENT INTERVENTION N/A 04/05/2020   Procedure: CORONARY STENT INTERVENTION;  Surgeon: Iran Ouch, MD;  Location: [facility] INVASIVE CV LAB;  Service: Cardiovascular;  Laterality: N/A;  . LEFT HEART CATH AND CORONARY ANGIOGRAPHY N/A 04/05/2020   Procedure: LEFT HEART CATH AND CORONARY ANGIOGRAPHY;  Surgeon: Iran Ouch, MD;  Location: [facility] INVASIVE CV LAB;  Service: Cardiovascular;  Laterality: N/A;    Family Psychiatric History:   Family History:  Family History  Problem Relation Age of Onset  . Depression Father   . Breast cancer Maternal Grandmother   . Depression Brother   . Anxiety disorder Brother   . ADD / ADHD Brother   . Alcohol abuse Brother   . Alcohol abuse Maternal Grandfather     Social History:  Social History   Socioeconomic History  . Marital status: Single    Spouse name: Not on file  . Number of children: Not on file  . Years of education: Not on file  . Highest education level: Not on file  Occupational History  . Not on file  Tobacco Use  . Smoking status: Former Games developer  . Smokeless tobacco: Never Used  . Tobacco comment: quit after  heartattack Sept 2021  Vaping Use  . Vaping Use: Never used  Substance and Sexual Activity  . Alcohol use: Never  .  Drug use: Never  . Sexual activity: Not on file  Other Topics Concern  . Not on file  Social History Narrative  . Not on file   Social Determinants of Health   Financial Resource Strain:   . Difficulty of Paying Living Expenses: Not on file  Food Insecurity:   . Worried About Programme researcher, broadcasting/film/video in the Last Year: Not on file  . Ran Out of Food in the Last Year: Not on file  Transportation Needs:   . Lack of Transportation (Medical): Not on file  .  Lack of Transportation (Non-Medical): Not on file  Physical Activity:   . Days of Exercise per Week: Not on file  . Minutes of Exercise per Session: Not on file  Stress:   . Feeling of Stress : Not on file  Social Connections:   . Frequency of Communication with Friends and Family: Not on file  . Frequency of Social Gatherings with Friends and Family: Not on file  . Attends Religious Services: Not on file  . Active Member of Clubs or Organizations: Not on file  . Attends Banker Meetings: Not on file  . Marital Status: Not on file    Allergies:  Allergies  Allergen Reactions  . Sulfamethoxazole-Trimethoprim Rash    Metabolic Disorder Labs: Lab Results  Component Value Date   HGBA1C 8.6 (H) 04/06/2020   MPG 200.12 04/06/2020   No results found for: PROLACTIN Lab Results  Component Value Date   CHOL 193 04/06/2020   TRIG 96 04/06/2020   HDL 50 04/06/2020   CHOLHDL 3.9 04/06/2020   VLDL 19 04/06/2020   LDLCALC 124 (H) 04/06/2020   No results found for: TSH  Therapeutic Level Labs: No results found for: LITHIUM No results found for: VALPROATE No components found for:  CBMZ  Current Medications: Current Outpatient Medications  Medication Sig Dispense Refill  . ACETAMINOPHEN EXTRA STRENGTH 500 MG tablet Take 500 mg by mouth every 4 (four) hours as needed for mild pain or headache.     . ARIPiprazole (ABILIFY) 10 MG tablet Take 1 tablet (10 mg total) by mouth daily. 30 tablet 2  . ARIPiprazole (ABILIFY) 5 MG tablet Take 1 tablet (5 mg total) by mouth daily. 30 tablet 0  . aspirin 81 MG chewable tablet Chew 1 tablet (81 mg total) by mouth daily.    Marland Kitchen atorvastatin (LIPITOR) 80 MG tablet Take 1 tablet (80 mg total) by mouth daily. 90 tablet 3  . Continuous Blood Gluc Sensor (FREESTYLE LIBRE 14 DAY SENSOR) MISC Apply topically as directed.    . doxycycline (VIBRAMYCIN) 50 MG capsule Take 1 capsule (50 mg total) by mouth daily. 30 capsule 0  . hydrOXYzine  (ATARAX/VISTARIL) 10 MG tablet Take 1 tablet (10 mg total) by mouth every 4 (four) hours as needed for anxiety. 30 tablet 1  . INPEN 100-PINK-NOVO DEVI 3 (three) times daily.    . insulin aspart (NOVOLOG) 100 UNIT/ML injection Inject 0-20 Units into the skin 3 (three) times daily before meals. Per sliding scale    . lamoTRIgine (LAMICTAL) 100 MG tablet Take 1 tablet (100 mg total) by mouth at bedtime. Take one tablet at bedtime. 30 tablet 1  . metoprolol succinate (TOPROL-XL) 25 MG 24 hr tablet Take 1 tablet (25 mg total) by mouth daily. Take with or immediately following a meal. (Patient taking differently: Take 25 mg by mouth in the morning and at bedtime. Take with or immediately following a meal.) 90 tablet  3  . nitroGLYCERIN (NITROSTAT) 0.4 MG SL tablet Place 1 tablet (0.4 mg total) under the tongue every 5 (five) minutes as needed for chest pain. 25 tablet 2  . NOVOFINE PEN NEEDLE 32G X 6 MM MISC SMARTSIG:Injection 5 Times Daily    . ticagrelor (BRILINTA) 90 MG TABS tablet Take 1 tablet (90 mg total) by mouth 2 (two) times daily. 60 tablet 11  . TRESIBA FLEXTOUCH 100 UNIT/ML FlexTouch Pen Inject 25 Units into the skin daily.      Current Facility-Administered Medications  Medication Dose Route Frequency Provider Last Rate Last Admin  . iron polysaccharides (NIFEREX) capsule 150 mg  150 mg Oral Daily [place], [place]oe [place], [place]         Musculoskeletal: Strength & Muscle Tone: within normal limits Gait & Station: normal Patient leans: N/A  Psychiatric Specialty Exam: Review of Systems  Last menstrual period 04/29/2020.There is no height or weight on file to calculate BMI.  General Appearance: Casual  Eye Contact:  Good  Speech:  Clear and Coherent  Volume:  Normal  Mood:  Anxious, Depressed and Irritable  Affect:  Congruent  Thought Process:  Coherent  Orientation:  Full (Time, Place, and Person)  Thought Content: Logical   Suicidal Thoughts:  No  Homicidal Thoughts:  No  Memory:   Immediate;   Fair Recent;   Fair  Judgement:  Fair  Insight:  Fair  Psychomotor Activity:  Normal  Concentration:  Concentration: Fair  Recall:  [employer] of Knowledge: Fair  Language: Good  Akathisia:  No  Handed:  Right  AIMS (if indicated):   Assets:  Communication Skills Desire for Improvement Social Support  ADL's:  Intact  Cognition: WNL  Sleep:  Fair   Screenings: PHQ2-9     Cardiac Rehab from [facility]/11/2020 in [facility] Counselor from 05/09/2020 in BEHAVIORAL HE[facility] PART[facility] HOSPITALIZATION PROGRAM Counselor from 05/01/2020 in BEHAVIORAL [facility]ALTH PARTIAL HOSPITALIZATION PROGRAM Counselor from 04/21/2020 in BEHAVIORAL HEALTH PARTIAL HOSPITALIZATION PROGRAM  PHQ-2 Total Score 3 2 5 5   PHQ-9 Total Score 7 10 13 16        Assessment and Plan:  Continue Intensive Outpatient Programing  Increased Abilify 5 mg to 10 mg po daily Continue  Lamictal 100 mg po QHS Continue Hydroxyzine 10 mg po PRN  Treatment plan was reviewed and agreed upon by NP T. and patient Marelyn Rouser need for group services    Melvyn Neth, NP 05/19/2020, 9:52 AM

## 2020-05-22 ENCOUNTER — Other Ambulatory Visit: Payer: Self-pay

## 2020-05-22 ENCOUNTER — Other Ambulatory Visit (HOSPITAL_COMMUNITY): Payer: BC Managed Care – PPO | Admitting: Psychiatry

## 2020-05-22 DIAGNOSIS — F3181 Bipolar II disorder: Secondary | ICD-10-CM

## 2020-05-22 DIAGNOSIS — Z79899 Other long term (current) drug therapy: Secondary | ICD-10-CM | POA: Diagnosis not present

## 2020-05-22 DIAGNOSIS — F439 Reaction to severe stress, unspecified: Secondary | ICD-10-CM | POA: Diagnosis not present

## 2020-05-22 DIAGNOSIS — F419 Anxiety disorder, unspecified: Secondary | ICD-10-CM | POA: Diagnosis not present

## 2020-05-22 DIAGNOSIS — F332 Major depressive disorder, recurrent severe without psychotic features: Secondary | ICD-10-CM | POA: Diagnosis not present

## 2020-05-22 DIAGNOSIS — R4589 Other symptoms and signs involving emotional state: Secondary | ICD-10-CM

## 2020-05-22 NOTE — Progress Notes (Signed)
Virtual Visit via Video Note  I connected with Alicia Phillips on 05/22/20 at  9:00 AM EDT by a video enabled telemedicine application and verified that I am speaking with the correct person using two identifiers.  At orientation to the IOP program, Case Manager discussed the limitations of evaluation and management by telemedicine and the availability of in person appointments. The patient expressed understanding and agreed to proceed with virtual visits throughout the duration of the program.   Location:  Patient: Patient Home Provider: Home Office   History of Present Illness: MDD and PTSD  Observations/Objective: Check In: Case Manager checked in with all participants to review discharge dates, insurance authorizations, work-related documents and needs for the treatment team. Counselor facilitated a check-in with group members to assess mood and current functioning. Client reports that she is feeling good today with a positive outlook on progress. Client noted a variety of strategies used to distract from anxieties and depression that are working for her management of MH. Client presents with mild depression and mild anxiety. Client denied any current SI/HI/psychosis.    Initial Therapeutic Activity: Counselor provided group with psychoeducation on being Introverted vs Extroverted, and how mental health is influenced. Counselor provided a link for group members to assess where they are on the spectrum. Client shared that their results indicate that they are public introvert and a private extrovert. Group processed their experiences, as Counselor provided history of Myers-Briggs and additional information on qualities of each category.   Second Therapeutic Activity: Counselor shifted discussion to childhood traumas that have impacted the ways in which they view themselves, others, and the world. Counselor provided psychoeducation on adverse childhood events and the impact of childhood trauma.  Client shared about challenges within her family of origin and how mental health is viewed.  Check Out: Counselor prompted group members to share one thing they could do for self-care or for fun. Client plans to meet up with friends to pumpkin carve. Counselor closed program by allowing time to celebrate a graduating group member. Counselor shared reflections on progress and allow space for group members to share well wishes and appreciates to the graduating client. Counselor prompted graduating client to share takeaways, reflect on progress and final thoughts for the group. Client indicated that they are not currently a risk to self of others before signing off.  Assessment and Plan: Clinician recommends that Client remain in IOP treatment to better manage mental health symptoms, stabilization and to address treatment plan goals. Clinician recommends adherence to crisis/safety plan, taking medications as prescribed, and following up with medical professionals if any issues arise.   Follow Up Instructions: Clinician will send Webex link for next session. The Client was advised to call back or seek an in-person evaluation if the symptoms worsen or if the condition fails to improve as anticipated.     I provided 180 minutes of non-face-to-face time during this encounter.     Hilbert Odor, LCSW

## 2020-05-23 ENCOUNTER — Other Ambulatory Visit: Payer: Self-pay

## 2020-05-23 ENCOUNTER — Other Ambulatory Visit (HOSPITAL_COMMUNITY): Payer: BC Managed Care – PPO

## 2020-05-23 DIAGNOSIS — F410 Panic disorder [episodic paroxysmal anxiety] without agoraphobia: Secondary | ICD-10-CM | POA: Diagnosis not present

## 2020-05-23 DIAGNOSIS — F4010 Social phobia, unspecified: Secondary | ICD-10-CM | POA: Diagnosis not present

## 2020-05-23 DIAGNOSIS — F330 Major depressive disorder, recurrent, mild: Secondary | ICD-10-CM | POA: Diagnosis not present

## 2020-05-23 DIAGNOSIS — F4311 Post-traumatic stress disorder, acute: Secondary | ICD-10-CM | POA: Diagnosis not present

## 2020-05-23 NOTE — Psych (Signed)
Virtual Visit via Video Note  I connected with Alicia Phillips on 05/02/20 at  9:00 AM EDT by a video enabled telemedicine application and verified that I am speaking with the correct person using two identifiers.   Location: Patient: Patient Home Provider: Clinical Home Office   I discussed the limitations of evaluation and management by telemedicine and the availability of in person appointments. The patient expressed understanding and agreed to proceed.  I discussed the assessment and treatment plan with the patient. The patient was provided an opportunity to ask questions and all were answered. The patient agreed with the plan and demonstrated an understanding of the instructions.   The patient was advised to call back or seek an in-person evaluation if the symptoms worsen or if the condition fails to improve as anticipated.  Pt was provided 240 minutes of non-face-to-face time during this encounter.   Donia Guiles, LCSW    [facility] BH PHP THERAPIST PROGRESS NOTE  Alicia Phillips [id number]  Session Time: 9:00 - 10:00  Participation Level: Active  Behavioral Response: CasualAlertAnxious and Depressed  Type of Therapy: Group Therapy  Treatment Goals addressed: Coping  Interventions: CBT, DBT, Supportive and Reframing  Summary: Clinician led check-in regarding current stressors and situation, and review of patient completed daily inventory. Clinician utilized active listening and empathetic response and validated patient emotions. Clinician facilitated processing group on pertinent issues.   Therapist Response: Alicia Phillips is a 39 y.o. female who presents with anxiety and depression symptoms. Patient arrived within time allowed and reports that she is feeling "good." Patient rates hermood at a7.5on a scale of 1-10 with 10 being great. Pt reports her COVID test came back negative and that is a relief. Pt reports using distraction to manage anxiety. Pt reports she was up  early ruminating and her fear regarding heart attack is resurging. Pt able to process. Pt engaged in discussion.     Session Time: 10:00 - 11:00   Participation Level: Active  Behavioral Response: CasualAlertDepressed  Type of Therapy: Group Therapy  Treatment Goals addressed: Coping  Interventions: CBT, DBT, Supportive and Reframing  Summary: [name] introduced DBT concept of radical acceptance. [name] discussed radical acceptance as a strategy to decrease distress and to manage situations outside of their control. Group volunteered struggles they are experiencing with control and cln helped group apply radical acceptance.    Therapist Response: Pt engaged in discussion and identifies she can apply radical acceptance to her health.       Session Time: 11:00- 12:00  Participation Level:Active  Behavioral Response:CasualAlertDepressed  Type of Therapy: Group Therapy, OT  Treatment Goals addressed: Coping  Interventions:Psychosocial skills training, Supportive  Summary:Occupational Therapy group  Therapist Response:Patient engaged in group. See OT note.      Session Time: 12:00 -1:00  Participation Level:Active  Behavioral Response:CasualAlertDepressed  Type of Therapy:Group therapy  Treatment Goals addressed: Coping  Interventions:CBT; Solution focused; Supportive; Reframing  Summary:12:00 - 12:50: [name] introduced DBT emotional regulation skills. Group discussed ways to apply opposite action, pleasant events, checking the facts, and PLEASE. [name] encouraged pt's to focus on keeping emotions stable not on being "happy."  12:50 -1:00 Clinician led check-out. Clinician assessed for immediate needs, medication compliance and efficacy, and safety concerns  Therapist Response:12:00 - 12:50: Pt engaged in discussion and reports focusing on applying PLEASE skill.    12:50 - 1:00: At check-out, patientrates hermood at [facility] a scale of  1-10 with 10 being great.Pt reports afternoon plans of resting and seeing  a Animator. Pt demonstrates some progress as evidenced by increased challenges to negative thinking. Patient denies SI/HI/self-harm at the end of group.    Suicidal/Homicidal: Nowithout intent/plan  Plan: Pt will continue in PHP while working to decrease anxiety and depression symptoms, increase emotional regulation, and increase ability to manage symptoms in a healthy manner.   Diagnosis: GAD (generalized anxiety disorder) [F41.1]    1. GAD (generalized anxiety disorder)   2. Bipolar II disorder (HCC)   3. Major depressive disorder, recurrent episode, moderate (HCC)      Donia Guiles, LCSW 05/23/2020

## 2020-05-23 NOTE — Psych (Signed)
Virtual Visit via Video Note  I connected with Alicia Phillips on 05/01/20 at  9:00 AM EDT by a video enabled telemedicine application and verified that I am speaking with the correct person using two identifiers.   Location: Patient: Patient Home Provider: Clinical Home Office   I discussed the limitations of evaluation and management by telemedicine and the availability of in person appointments. The patient expressed understanding and agreed to proceed.  I discussed the assessment and treatment plan with the patient. The patient was provided an opportunity to ask questions and all were answered. The patient agreed with the plan and demonstrated an understanding of the instructions.   The patient was advised to call back or seek an in-person evaluation if the symptoms worsen or if the condition fails to improve as anticipated.  Pt was provided 240 minutes of non-face-to-face time during this encounter.   Alicia Guiles, LCSW    [facility] BH PHP THERAPIST PROGRESS NOTE  [name] [id number]  Session Time: 9:00 - 10:00  Participation Level: Active  Behavioral Response: CasualAlertAnxious and Depressed  Type of Therapy: Group Therapy  Treatment Goals addressed: Coping  Interventions: CBT, DBT, Supportive and Reframing  Summary: Clinician led check-in regarding current stressors and situation, and review of patient completed daily inventory. Clinician utilized active listening and empathetic response and validated patient emotions. Clinician facilitated processing group on pertinent issues.   Therapist Response: Alicia Phillips is a 39 y.o. female who presents with anxiety and depression symptoms. Patient arrived within time allowed and reports that she is feeling "okay." Patient rates hermood at a7.5on a scale of 1-10 with 10 being great. Pt reports she attempted to utilize distractions to manage her anxiety over the weekend. Pt states she made progress by driving for the  first time and continues to struggle with her blood sugar. Pt able to process. Pt engaged in discussion.     Session Time: 10:00 - 11:00   Participation Level: Active  Behavioral Response: CasualAlertDepressed  Type of Therapy: Group Therapy  Treatment Goals addressed: Coping  Interventions: CBT, DBT, Supportive and Reframing  Summary: [name] led discussion on processing emotions. Group members shared difficulties they have in acknowledging and processing feelings. [name] encouraged pt's to consider that it is not always the time to process emotions and safety needs to be considered. Group discussed distractions as a way to manage feelings when not in a safe situation and discussed journaling as a way to process emotions when able to.   Therapist Response:  Pt engaged in discussion and reports understanding of how to assess for safety when wanting to process feelings and willingness to try journaling to process.       Session Time: 11:00- 12:00  Participation Level:Active  Behavioral Response:CasualAlertDepressed  Type of Therapy: Group Therapy, OT  Treatment Goals addressed: Coping  Interventions:Psychosocial skills training, Supportive  Summary:Occupational Therapy group  Therapist Response:Patient engaged in group. See OT note.      Session Time: 12:00 -1:00  Participation Level:Active  Behavioral Response:CasualAlertDepressed  Type of Therapy:Group therapy  Treatment Goals addressed: Coping  Interventions:CBT; Solution focused; Supportive; Reframing  Summary:12:00 - 12:50: [name] continued topic of CBT cognitive distortions and introduced thought challenging as a way to  utilize the "challenge" C in C-C-C. Group utilized Administrator, Civil [employer] questions" as a way to introduce challenges and reframe distorted thinking. Group members worked through pt examples to practice challenging distorted thinking.  12:50 -1:00 Clinician led  check-out. Clinician assessed for immediate needs, medication  compliance and efficacy, and safety concerns  Therapist Response:12:00 - 12:50: Pt engaged in discussion and demonstrates understanding of challenging distorted thoughts through practice.  12:50 - 1:00: At check-out, patientrates hermood at [facility] a scale of 1-10 with 10 being great.Pt reports afternoon plans of taking a walk. Pt demonstrates some progress as evidenced byintentional practice of skills discussed. Patient denies SI/HI/self-harm at the end of group.    Suicidal/Homicidal: Nowithout intent/plan  Plan: Pt will continue in PHP while working to decrease anxiety and depression symptoms, increase emotional regulation, and increase ability to manage symptoms in a healthy manner.   Diagnosis: GAD (generalized anxiety disorder) [F41.1]    1. GAD (generalized anxiety disorder)   2. Severe episode of recurrent major depressive disorder, without psychotic features (HCC)      Alicia Guiles, LCSW 05/23/2020

## 2020-05-24 ENCOUNTER — Other Ambulatory Visit: Payer: Self-pay

## 2020-05-24 ENCOUNTER — Other Ambulatory Visit (HOSPITAL_COMMUNITY): Payer: BC Managed Care – PPO | Admitting: Family

## 2020-05-24 ENCOUNTER — Encounter (HOSPITAL_COMMUNITY): Payer: Self-pay

## 2020-05-24 DIAGNOSIS — F3181 Bipolar II disorder: Secondary | ICD-10-CM

## 2020-05-24 DIAGNOSIS — Z87891 Personal history of nicotine dependence: Secondary | ICD-10-CM | POA: Diagnosis not present

## 2020-05-24 DIAGNOSIS — F419 Anxiety disorder, unspecified: Secondary | ICD-10-CM | POA: Diagnosis not present

## 2020-05-24 DIAGNOSIS — I214 Non-ST elevation (NSTEMI) myocardial infarction: Secondary | ICD-10-CM

## 2020-05-24 DIAGNOSIS — F439 Reaction to severe stress, unspecified: Secondary | ICD-10-CM | POA: Diagnosis not present

## 2020-05-24 DIAGNOSIS — Z955 Presence of coronary angioplasty implant and graft: Secondary | ICD-10-CM | POA: Diagnosis not present

## 2020-05-24 DIAGNOSIS — Z79899 Other long term (current) drug therapy: Secondary | ICD-10-CM | POA: Diagnosis not present

## 2020-05-24 DIAGNOSIS — F332 Major depressive disorder, recurrent severe without psychotic features: Secondary | ICD-10-CM | POA: Diagnosis not present

## 2020-05-24 LAB — GLUCOSE, CAPILLARY
Glucose-Capillary: 203 mg/dL — ABNORMAL HIGH (ref 70–99)
Glucose-Capillary: 127 mg/dL — ABNORMAL HIGH (ref 70–99)

## 2020-05-24 MED ORDER — HYDROXYZINE HCL 10 MG PO TABS: 10.0000 mg | ORAL_TABLET | Freq: Three times a day (TID) | ORAL | 1 refills | Status: DC | PRN

## 2020-05-24 MED ORDER — HYDROXYZINE HCL 10 MG PO TABS: 10.0000 mg | ORAL_TABLET | ORAL | 1 refills | Status: DC | PRN

## 2020-05-24 NOTE — Progress Notes (Signed)
Virtual Visit via Video Note  I connected with Alicia Phillips on 05/24/20 at  9:00 AM EDT by a video enabled telemedicine application and verified that I am speaking with the correct person using two identifiers.  At orientation to the IOP program, Case Manager discussed the limitations of evaluation and management by telemedicine and the availability of in person appointments. The patient expressed understanding and agreed to proceed with virtual visits throughout the duration of the program.   Location:  Patient: Patient Home Provider: Home Office   History of Present Illness: MDD and PTSD  Observations/Objective: Check In: Case Manager checked in with all participants to review discharge dates, insurance authorizations, work-related documents and needs for the treatment team.    Initial Therapeutic Activity: Counselor facilitated a check-in with group members to assess mood and current functioning. Client reports that she was overcome with a recent diagnosis of PTSD in relation to her medical trauma experienced a month ago. Client discussed being curious to learn more about this diagnosis and strategies to combat acuity. Client additionally discussed concern about a friend who is also battling depression and alcoholism. Counselor provided resources both for PTSD and to connect her friend to services. Client presents with moderate depression and moderate anxiety. Client denied any current SI/HI/psychosis.   Second Therapeutic Activity: Counselor prompted group members to reflect on their ideas related to Boundaries. Counselor asked, how do they know boundaries are healthy, what makes them feel safe and how can they improve their boundaries? Client stated that she needs security, to be loved and heard, no fixing, to sit with and love through her depression. Counselor presented psychoeducation on the topic of boundaries, defining types, social and cultural impacts and then we labeled what type of  boundaries they have in the various areas of their lives. Client recognizes that she has porous boundaries and has difficulty with saying no. Counselor provided practical tips and strategies for setting healthier boundaries in life and relationships. Client committed in implementing strategies to improve boundary setting.   Check Out: Counselor closed group by prompting group members to share their plans for self-care today. Client plans to be positive about her cardiac rehab appointment and recovery. Client indicated that they are not currently a risk to self of others before signing off.  Assessment and Plan: Clinician recommends that Client remain in IOP treatment to better manage mental health symptoms, stabilization and to address treatment plan goals. Clinician recommends adherence to crisis/safety plan, taking medications as prescribed, and following up with medical professionals if any issues arise.   Follow Up Instructions: Clinician will send Webex link for next session. The Client was advised to call back or seek an in-person evaluation if the symptoms worsen or if the condition fails to improve as anticipated.     I provided 180 minutes of non-face-to-face time during this encounter.     Hilbert Odor, LCSW

## 2020-05-24 NOTE — Progress Notes (Signed)
Daily Session Note  Patient Details  Name: Alicia Phillips MRN: [id number] Date of Birth: 10-29-1980 Referring Provider:    Encounter Date: 05/24/2020  Check In:  Session Check In - 05/24/20 1400      Check-In   Supervising physician immediately available to respond to emergencies See telemetry face sheet for immediately available ER MD    Location [facility]-Cardiac & Pulmonary Rehab    Staff Present Birdie Sons, MPA, RN;Joseph Lou Miner, MS Exercise Physiologist    Virtual Visit No    Medication changes reported     No    Fall or balance concerns reported    No    Tobacco Cessation Use Increase    Current number of cigarettes/nicotine per day     15    Warm-up and Cool-down Performed on first and last piece of equipment    Resistance Training Performed Yes    VAD Patient? No    PAD/SET Patient? No      Pain Assessment   Currently in Pain? No/denies              Social History   Tobacco Use  Smoking Status Former Smoker  Smokeless Tobacco Never Used  Tobacco Comment   quit after  heartattack Sept 2021    Goals Met:  Independence with exercise equipment Exercise tolerated well No report of cardiac concerns or symptoms Strength training completed today  Goals Unmet:  Not Applicable  Comments: Pt able to follow exercise prescription today without complaint.  Will continue to monitor for progression.   Dr. Joci Filbert is Medical Director for [place] and [facility].

## 2020-05-25 ENCOUNTER — Encounter: Payer: BC Managed Care – PPO | Admitting: *Deleted

## 2020-05-25 ENCOUNTER — Other Ambulatory Visit: Payer: Self-pay

## 2020-05-25 ENCOUNTER — Other Ambulatory Visit (HOSPITAL_COMMUNITY): Payer: BC Managed Care – PPO | Admitting: Licensed Clinical Social Worker

## 2020-05-25 DIAGNOSIS — I214 Non-ST elevation (NSTEMI) myocardial infarction: Secondary | ICD-10-CM

## 2020-05-25 DIAGNOSIS — F419 Anxiety disorder, unspecified: Secondary | ICD-10-CM | POA: Diagnosis not present

## 2020-05-25 DIAGNOSIS — Z87891 Personal history of nicotine dependence: Secondary | ICD-10-CM | POA: Diagnosis not present

## 2020-05-25 DIAGNOSIS — Z955 Presence of coronary angioplasty implant and graft: Secondary | ICD-10-CM | POA: Diagnosis not present

## 2020-05-25 DIAGNOSIS — Z79899 Other long term (current) drug therapy: Secondary | ICD-10-CM | POA: Diagnosis not present

## 2020-05-25 DIAGNOSIS — F332 Major depressive disorder, recurrent severe without psychotic features: Secondary | ICD-10-CM

## 2020-05-25 DIAGNOSIS — F411 Generalized anxiety disorder: Secondary | ICD-10-CM

## 2020-05-25 DIAGNOSIS — F439 Reaction to severe stress, unspecified: Secondary | ICD-10-CM | POA: Diagnosis not present

## 2020-05-25 NOTE — Psych (Signed)
Virtual Visit via Video Note  I connected with Alicia Phillips on 05/04/20 at  9:00 AM EDT by a video enabled telemedicine application and verified that I am speaking with the correct person using two identifiers.   Location: Patient: Patient Home Provider: Clinical Home Office   I discussed the limitations of evaluation and management by telemedicine and the availability of in person appointments. The patient expressed understanding and agreed to proceed.  I discussed the assessment and treatment plan with the patient. The patient was provided an opportunity to ask questions and all were answered. The patient agreed with the plan and demonstrated an understanding of the instructions.   The patient was advised to call back or seek an in-person evaluation if the symptoms worsen or if the condition fails to improve as anticipated.  Pt was provided 240 minutes of non-face-to-face time during this encounter.   Donia Guiles, LCSW    [facility] BH PHP THERAPIST PROGRESS NOTE  [name] [id number]  Session Time: 9:00 - 10:00  Participation Level: Active  Behavioral Response: CasualAlertAnxious and Depressed  Type of Therapy: Group Therapy  Treatment Goals addressed: Coping  Interventions: CBT, DBT, Supportive and Reframing  Summary: Clinician led check-in regarding current stressors and situation, and review of patient completed daily inventory. Clinician utilized active listening and empathetic response and validated patient emotions. Clinician facilitated processing group on pertinent issues.   Therapist Response: Alicia Phillips is a 39 y.o. female who presents with anxiety and depression symptoms. Patient arrived within time allowed and reports that she is feeling "pretty good." Patient rates hermood at [facility] a scale of 1-10 with 10 being great. Pt reports her anxiety was high most of yesterday and she attempted to use skills. Pt reports she utilized her support system. Pt  able to process. Pt engaged in discussion.     Session Time: 10:00 - 11:00   Participation Level: Active  Behavioral Response: CasualAlertDepressed  Type of Therapy: Group Therapy  Treatment Goals addressed: Coping  Interventions: CBT, DBT, Supportive and Reframing  Summary: [name] continued topic of DBT distress tolerance skills. [name] introduced STOP skill and group discussed ways they could apply the STOP skill in their every day life.    Therapist Response:  Pt engaged in discussion and reports she can apply STOP when ruminating about her health.       Session Time: 11:00- 12:00  Participation Level:Active  Behavioral Response:CasualAlertDepressed  Type of Therapy: Group Therapy, OT  Treatment Goals addressed: Coping  Interventions:Psychosocial skills training, Supportive  Summary:Occupational Therapy group  Therapist Response:Patient engaged in group. See OT note.      Session Time: 12:00 -1:00  Participation Level:Active  Behavioral Response:CasualAlertDepressed  Type of Therapy:Group therapy  Treatment Goals addressed: Coping  Interventions:CBT; Solution focused; Supportive; Reframing  Summary:12:00 - 12:50: [name] continued topic of DBT distress tolerance skills. [name] introduced TIPP skills to use during cases of extreme emotion. Group practiced deep breathing and progressive muscle relaxation together. Group discussed which situations they can apply TIPP skills.  12:50 -1:00 Clinician led check-out. Clinician assessed for immediate needs, medication compliance and efficacy, and safety concerns  Therapist Response:12:00 - 12:50: Pt engaged in discussion reports most likely to utilize paced breathing.  12:50 - 1:00: At check-out, patientrates hermood at a8.5on a scale of 1-10 with 10 being great.Pt reports afternoon plans of collaging. Pt demonstrates some progress as evidenced by increased use of social support.  Patient denies SI/HI/self-harm at the end of group.  Suicidal/Homicidal: Nowithout intent/plan  Plan: Pt will continue in PHP while working to decrease anxiety and depression symptoms, increase emotional regulation, and increase ability to manage symptoms in a healthy manner.   Diagnosis: GAD (generalized anxiety disorder) [F41.1]    1. GAD (generalized anxiety disorder)   2. Severe episode of recurrent major depressive disorder, without psychotic features (HCC)      Donia Guiles, LCSW 05/25/2020

## 2020-05-25 NOTE — Progress Notes (Signed)
Daily Session Note  Patient Details  Name: Alicia Phillips MRN: [id number] Date of Birth: 05/18/1981 Referring Provider:    Encounter Date: 05/25/2020  Check In:  Session Check In - 05/25/20 1348      Check-In   Supervising physician immediately available to respond to emergencies See telemetry face sheet for immediately available ER MD    Location [facility]    Staff Present Renita Papa, RN BSN;Joseph [address] [place], [place], [place], CCRP, CCET    Virtual Visit No    Medication changes reported     No    Fall or balance concerns reported    No    Tobacco Cessation Use Decreased    Current number of cigarettes/nicotine per day     3    Warm-up and Cool-down Performed on first and last piece of equipment    Resistance Training Performed Yes    VAD Patient? No    PAD/SET Patient? No      Pain Assessment   Currently in Pain? No/denies              Social History   Tobacco Use  Smoking Status Former Smoker  Smokeless Tobacco Never Used  Tobacco Comment   quit after  heartattack Sept 2021    Goals Met:  Independence with exercise equipment Exercise tolerated well No report of cardiac concerns or symptoms Strength training completed today  Goals Unmet:  Not Applicable  Comments: Pt able to follow exercise prescription today without complaint.  Will continue to monitor for progression.    Dr. Shadae Filbert is Medical Director for [place] and [facility].

## 2020-05-25 NOTE — Psych (Signed)
Virtual Visit via Video Note  I connected with Alicia Phillips on 05/03/20 at  9:00 AM EDT by a video enabled telemedicine application and verified that I am speaking with the correct person using two identifiers.   Location: Patient: Patient Home Provider: Clinical Home Office   I discussed the limitations of evaluation and management by telemedicine and the availability of in person appointments. The patient expressed understanding and agreed to proceed.  I discussed the assessment and treatment plan with the patient. The patient was provided an opportunity to ask questions and all were answered. The patient agreed with the plan and demonstrated an understanding of the instructions.   The patient was advised to call back or seek an in-person evaluation if the symptoms worsen or if the condition fails to improve as anticipated.  Pt was provided 240 minutes of non-face-to-face time during this encounter.   Donia Guiles, LCSW    [facility] BH PHP THERAPIST PROGRESS NOTE  Alicia Phillips [id number]  Session Time: 9:00 - 10:00  Participation Level: Active  Behavioral Response: CasualAlertAnxious and Depressed  Type of Therapy: Group Therapy  Treatment Goals addressed: Coping  Interventions: CBT, DBT, Supportive and Reframing  Summary: Clinician led check-in regarding current stressors and situation, and review of patient completed daily inventory. Clinician utilized active listening and empathetic response and validated patient emotions. Clinician facilitated processing group on pertinent issues.   Therapist Response: Alicia Phillips is a 39 y.o. female who presents with anxiety and depression symptoms. Patient arrived within time allowed and reports that she is feeling "okay." Patient rates hermood at a2.5on a scale of 1-10 with 10 being great. Pt reports she was in the Emergency room last night due to heart palpitations. Pt states high anxiety, fear she was dying, and passive  SI. Pt stayed with her mom for safety. Pt able to process. Pt engaged in discussion.     Session Time: 10:00 - 11:00   Participation Level: Active  Behavioral Response: CasualAlertDepressed  Type of Therapy: Group Therapy  Treatment Goals addressed: Coping  Interventions: CBT, DBT, Supportive and Reframing  Summary: [name] led discussion on positive self-esteem. Group shared struggles they experience with self-esteem. Group highlights difficulty in accepting compliments. [name] encouraged pt's to use checking the facts as a way to challenge the negative thinking which accompanies receving compliments. [name] suggested pt's consider making a compliment file in which they document positive things people say to/about them to review when they are feeling poorly about themselves.   Therapist Response: Pt engaged in conversation. Pt is able to process and reports being open to starting a compliment file.       Session Time: 11:00- 12:00  Participation Level:Active  Behavioral Response:CasualAlertDepressed  Type of Therapy: Group therapy  Treatment Goals addressed: Coping  Interventions:Supportive, Reframing  Summary:Spiritual Care group  Therapist Response:Pt engaged in session. See chaplain note      Session Time: 12:00 -1:00  Participation Level:Active  Behavioral Response:CasualAlertDepressed  Type of Therapy:Group therapy  Treatment Goals addressed: Coping  Interventions:Psychosocial skills training, Supportive  Summary:12:00 - 12:50: Occupational Therapy group 12:50 -1:00 Clinician led check-out. Clinician assessed for immediate needs, medication compliance and efficacy, and safety concerns  Therapist Response:12:00 - 12:50: Patient engaged in group. See OT note. 12:50 - 1:00: At check-out, patientrates hermood at [facility] a scale of 1-10 with 10 being great.Pt reports afternoon plans of spending time with family. Pt  demonstrates some progress as evidenced byincreased use of thought challenging. Patient denies SI/HI/self-harm at  the end of group.    Suicidal/Homicidal: Nowithout intent/plan  Plan: Pt will continue in PHP while working to decrease anxiety and depression symptoms, increase emotional regulation, and increase ability to manage symptoms in a healthy manner.   Diagnosis: GAD (generalized anxiety disorder) [F41.1]    1. GAD (generalized anxiety disorder)   2. Major depressive disorder, recurrent episode, moderate (HCC)      Donia Guiles, LCSW 05/25/2020

## 2020-05-26 ENCOUNTER — Other Ambulatory Visit (HOSPITAL_COMMUNITY): Payer: BC Managed Care – PPO | Admitting: Psychiatry

## 2020-05-26 ENCOUNTER — Other Ambulatory Visit: Payer: Self-pay

## 2020-05-26 ENCOUNTER — Encounter (HOSPITAL_COMMUNITY): Payer: Self-pay | Admitting: Psychiatry

## 2020-05-26 DIAGNOSIS — F439 Reaction to severe stress, unspecified: Secondary | ICD-10-CM | POA: Diagnosis not present

## 2020-05-26 DIAGNOSIS — F332 Major depressive disorder, recurrent severe without psychotic features: Secondary | ICD-10-CM | POA: Diagnosis not present

## 2020-05-26 DIAGNOSIS — Z79899 Other long term (current) drug therapy: Secondary | ICD-10-CM | POA: Diagnosis not present

## 2020-05-26 DIAGNOSIS — F419 Anxiety disorder, unspecified: Secondary | ICD-10-CM | POA: Diagnosis not present

## 2020-05-26 DIAGNOSIS — F3181 Bipolar II disorder: Secondary | ICD-10-CM

## 2020-05-26 NOTE — Progress Notes (Signed)
Virtual Visit via Video Note  I connected with Alicia Phillips on 05/26/20 at  9:00 AM EDT by a video enabled telemedicine application and verified that I am speaking with the correct person using two identifiers.  At orientation to the IOP program, Case Manager discussed the limitations of evaluation and management by telemedicine and the availability of in person appointments. The patient expressed understanding and agreed to proceed with virtual visits throughout the duration of the program.   Location:  Patient: Patient Home Provider: Home Office   History of Present Illness: MDD and PTSD  Observations/Objective: Check In: Case Manager checked in with all participants to review discharge dates, insurance authorizations, work-related documents and needs for the treatment team. Counselor facilitated a check-in with group members to assess mood and current functioning. Client reports that she was able to attend an event with others despite her anxiety about driving and social interactions. Client unable to participate completely, but was confident in what she was able to achive. Client presents with moderate depression and moderate anxiety. Client denied any current SI/HI/psychosis.   Initial Therapeutic Activity: Counselor presented information on Cognitive Distortions. Client shared a video outlining 15 styles of cognitive distortions. Group members shared which ones they do most in their thought processes. Client noted that she connected with all of them in how she thinks and perceives the world. Counselor shared 5 strategies for combating distorted thinking. Counselor shared links for resources for Group Members to review for homework.  Second Therapeutic Activity: Counselor introduced [employer], representative with [facility] to share about programming. Group Members asked questions and engaged in discussion, as Trinna Post shared about Peer Support, Support Groups and the  [employer]. Client stated that they are interested in connecting with the [facility] and the [facility] [employer] [facility].  Check Out: Counselor closed program by allowing time to celebrate a graduating group member. Counselor shared reflections on progress and allow space for group members to share well wishes and appreciates to the graduating client. Counselor prompted graduating client to share takeaways, reflect on progress and final thoughts for the group. Client indicated that they are not currently a risk to self of others before signing off.  Assessment and Plan: Clinician recommends that Client remain in IOP treatment to better manage mental health symptoms, stabilization and to address treatment plan goals. Clinician recommends adherence to crisis/safety plan, taking medications as prescribed, and following up with medical professionals if any issues arise.   Follow Up Instructions: Clinician will send Webex link for next session. The Client was advised to call back or seek an in-person evaluation if the symptoms worsen or if the condition fails to improve as anticipated.     I provided 180 minutes of non-face-to-face time during this encounter.     Hilbert Odor, LCSW

## 2020-05-28 ENCOUNTER — Encounter (HOSPITAL_COMMUNITY): Payer: Self-pay

## 2020-05-29 NOTE — Psych (Signed)
Virtual Visit via Video Note  I connected with Alicia Phillips on 05/12/20 at  9:00 AM EDT by a video enabled telemedicine application and verified that I am speaking with the correct person using two identifiers.   Location: Patient: Patient Home Provider: Clinical Home Office   I discussed the limitations of evaluation and management by telemedicine and the availability of in person appointments. The patient expressed understanding and agreed to proceed.  I discussed the assessment and treatment plan with the patient. The patient was provided an opportunity to ask questions and all were answered. The patient agreed with the plan and demonstrated an understanding of the instructions.   The patient was advised to call back or seek an in-person evaluation if the symptoms worsen or if the condition fails to improve as anticipated.  Pt was provided 240 minutes of non-face-to-face time during this encounter.   Alicia Guiles, LCSW    [facility] BH PHP THERAPIST PROGRESS NOTE  [name] [id number]  Session Time: 9:00 - 10:00  Participation Level: Active  Behavioral Response: CasualAlertDepressed  Type of Therapy: Individual Therapy  Treatment Goals addressed: Coping  Interventions: CBT, DBT, Supportive and Reframing  Summary: Clinician led check-in regarding current stressors and situation, and review of patient completed daily inventory. Clinician utilized active listening and empathetic response and validated patient emotions. Clinician facilitated processing group on pertinent issues.   Therapist Response: Alicia Phillips is a 39 y.o. female who presents with anxiety and depression symptoms. Patient arrived within time allowed and reports that she is feeling "good." Patient rates hermood at a8.5on a scale of 1-10 with 10 being great. Pt reports she attended her cardiac rehab appointment and it went well. Pt states she utilized skills to manage her anxiety during and after. Pt  reports sleeping well. Pt able to process. Pt engaged in discussion.     Session Time: 10:00 - 11:00   Participation Level: Active  Behavioral Response: CasualAlertDepressed  Type of Therapy: Individual Therapy  Treatment Goals addressed: Coping  Interventions: CBT, DBT, Supportive and Reframing  Summary: [name] led discussion on mood tracking and the benefits of keeping a record of daily mood. [name] encouraged pt to utilize the tracker as "facts" for thought challenging re: progress and symptoms.   Therapist Response: Pt engaged in discussion and is able to brainstorm situations in which mood tracking could help her challenge thoughts.       Session Time: 11:00- 12:00  Participation Level:Active  Behavioral Response:CasualAlertDepressed  Type of Therapy: Individual Therapy, OT  Treatment Goals addressed: Coping  Interventions:Psychosocial skills training, Supportive  Summary:Occupational Therapy group  Therapist Response:Patient engaged in group. See OT note.      Session Time: 12:00 -1:00  Participation Level:Active  Behavioral Response:CasualAlertDepressed  Type of Therapy:Individual therapy  Treatment Goals addressed: Coping  Interventions:CBT; Solution focused; Supportive; Reframing  Summary:12:00 - 12:50: [name] continued topic of DBT interpersonal effectiveness skills. [name] led discussion on FAST and GIVE skills and worked with pt to identify problematic areas. 12:50 -1:00 Clinician led check-out. Clinician assessed for immediate needs, medication compliance and efficacy, and safety concerns  Therapist Response:12:00 - 12:50: Pt engaged in discussion and reports needing most help with the FAST skill.  12:50 - 1:00: At check-out, patientrates hermood at a9.5on a scale of 1-10 with 10 being great.Pt reports afternoon plans of spending time with family. Pt demonstrates some progress as evidenced by more consistent use  of skills to manage mood. Patient denies SI/HI/self-harm at the end of  group.    Suicidal/Homicidal: Nowithout intent/plan  Plan: Pt will discharge from PHP due to meeting treatment goals of decreased anxiety and depression symptoms, increased emotional regulation, and increased ability to manage symptoms in a healthy manner. Pt will step down to IOP within this agency to increase stabilization. Pt will begin IOP on 10/18. Pt and provider are aligned with discharge plan. Pt denies SI/HI at time of discharge.   Diagnosis: Severe episode of recurrent major depressive disorder, without psychotic features (HCC) [F33.2]    1. Severe episode of recurrent major depressive disorder, without psychotic features (HCC)   2. Generalized anxiety disorder      Alicia Guiles, LCSW 05/29/2020

## 2020-05-29 NOTE — Psych (Signed)
Virtual Visit via Video Note  I connected with Alicia Phillips on 05/08/20 at  9:00 AM EDT by a video enabled telemedicine application and verified that I am speaking with the correct person using two identifiers.   Location: Patient: Patient Home Provider: Clinical Home Office   I discussed the limitations of evaluation and management by telemedicine and the availability of in person appointments. The patient expressed understanding and agreed to proceed.  I discussed the assessment and treatment plan with the patient. The patient was provided an opportunity to ask questions and all were answered. The patient agreed with the plan and demonstrated an understanding of the instructions.   The patient was advised to call back or seek an in-person evaluation if the symptoms worsen or if the condition fails to improve as anticipated.  Pt was provided 240 minutes of non-face-to-face time during this encounter.   Donia Guiles, LCSW    [facility] BH PHP THERAPIST PROGRESS NOTE  Alicia Phillips [id number]  Session Time: 9:00 - 10:00  Participation Level: Active  Behavioral Response: CasualAlertDepressed  Type of Therapy: Group Therapy  Treatment Goals addressed: Coping  Interventions: CBT, DBT, Supportive and Reframing  Summary: Clinician led check-in regarding current stressors and situation, and review of patient completed daily inventory. Clinician utilized active listening and empathetic response and validated patient emotions. Clinician facilitated processing group on pertinent issues.   Therapist Response: Alicia Phillips is a 39 y.o. female who presents with anxiety and depression symptoms. Patient arrived within time allowed and reports that she is feeling "good." Patient rates hermood at a8.5on a scale of 1-10 with 10 being great. Pt reportsthe first part of her weekend went well and she was social Pt states Sunday was "very difficult" and she struggled with medical anxiety  and managing it. Pt able to process. Pt engaged in discussion.     Session Time: 10:00 - 11:00   Participation Level: Active  Behavioral Response: CasualAlertDepressed  Type of Therapy: Group Therapy  Treatment Goals addressed: Coping  Interventions: CBT, DBT, Supportive and Reframing  Summary: [name] led discussion on CBT core beliefs. [name] provided context for core beliefs and how they impact our perception. Group members discussed possible core beliefs they hold and how it is affecting them.   Therapist Response:  Pt engaged in discussion and is able to identify core beliefs they experience.      Session Time: 11:00- 12:00  Participation Level:Active  Behavioral Response:CasualAlertDepressed  Type of Therapy: Group Therapy   Treatment Goals addressed: Coping  Interventions: CBT, DBT, Supportive and Reframing  Summary: [name] introduced topic of vulnerability. Group viewed TED talk "the power of vulnerability" to aid discussion. Group members shared the relationship they have with vulnerability and how it impacts their relationships.   Therapist Response: Pt engaged in discussion and reports vulnerability is a struggle for her.       Session Time: 12:00 -1:00  Participation Level:Active  Behavioral Response:CasualAlertDepressed  Type of Therapy:Group therapy  Treatment Goals addressed: Coping  Interventions:CBT; Solution focused; Supportive; Reframing  Summary:12:00 - 12:50: [name] led discussion on safety and the ways in which we can seek and own it in ourselves. Group members shared how they view safety and situations which hinder safety. [name] encouraged pt's to think about emotional safety and ways to foster it.  12:50 -1:00 Clinician led check-out. Clinician assessed for immediate needs, medication compliance and efficacy, and safety concerns  Therapist Response:12:00 - 12:50: Pt engaged in discussion and reports difficulty with  finding safety in herself. Pt able to process.  12:50 - 1:00: At check-out, patientrates hermood at [facility] a scale of 1-10 with 10 being great.Pt reports afternoon plans of doing something creative. Pt demonstrates some progress as evidenced byactive use of skills. Patient denies SI/HI/self-harm at the end of group.    Suicidal/Homicidal: Nowithout intent/plan  Plan: Pt will continue in PHP while working to decrease anxiety and depression symptoms, increase emotional regulation, and increase ability to manage symptoms in a healthy manner.   Diagnosis: GAD (generalized anxiety disorder) [F41.1]    1. GAD (generalized anxiety disorder)   2. Severe episode of recurrent major depressive disorder, without psychotic features (HCC)      Donia Guiles, LCSW 05/29/2020

## 2020-05-29 NOTE — Psych (Signed)
Virtual Visit via Video Note  I connected with Alicia Phillips on 05/10/20 at  9:00 AM EDT by a video enabled telemedicine application and verified that I am speaking with the correct person using two identifiers.   Location: Patient: Patient Home Provider: Clinical Home Office   I discussed the limitations of evaluation and management by telemedicine and the availability of in person appointments. The patient expressed understanding and agreed to proceed.  I discussed the assessment and treatment plan with the patient. The patient was provided an opportunity to ask questions and all were answered. The patient agreed with the plan and demonstrated an understanding of the instructions.   The patient was advised to call back or seek an in-person evaluation if the symptoms worsen or if the condition fails to improve as anticipated.  Pt was provided 240 minutes of non-face-to-face time during this encounter.   Alicia Guiles, LCSW    [facility] BH PHP THERAPIST PROGRESS NOTE  KAYDANCE BOWIE [id number]  Session Time: 9:00 - 10:00  Participation Level: Active  Behavioral Response: CasualAlertDepressed  Type of Therapy: Individual Therapy  Treatment Goals addressed: Coping  Interventions: CBT, DBT, Supportive and Reframing  Summary: Clinician led check-in regarding current stressors and situation, and review of patient completed daily inventory. Clinician utilized active listening and empathetic response and validated patient emotions. Clinician facilitated processing group on pertinent issues.   Therapist Response: Alicia Phillips is a 39 y.o. female who presents with anxiety and depression symptoms. Patient arrived within time allowed and reports that she is feeling "good." Patient rates hermood at a8.5on a scale of 1-10 with 10 being great. Pt reports she struggled with physical anxiety symptoms throughout the day and they interfered with her sleep. Pt reports struggling with  nightmares. Pt able to process. Pt engaged in discussion.     Session Time: 10:00 - 11:00   Participation Level: Active  Behavioral Response: CasualAlertDepressed  Type of Therapy: Individual Therapy  Treatment Goals addressed: Coping  Interventions: CBT, DBT, Supportive and Reframing  Summary: [name] introduced DBT topic of mindfulness. [name] utilized DBT handout to discuss the "what" and "how" skills of mindfulness. [name] and pt discussed multiple ways to view and practice mindfulness.   Therapist Response: Pt engaged in conversation and reports understanding of mindfulness. Pt able to process potential barriers and problem solve solutions.       Session Time: 11:00- 12:00  Participation Level:Active  Behavioral Response:CasualAlertDepressed  Type of Therapy: Individual  therapy  Treatment Goals addressed: Coping  Interventions:Supportive, Reframing  Summary:Spiritual Care group  Therapist Response:Pt engaged in session. See chaplain note      Session Time: 12:00 -1:00  Participation Level:Active  Behavioral Response:CasualAlertDepressed  Type of Therapy:Individual therapy  Treatment Goals addressed: Coping  Interventions:Psychosocial skills training, Supportive  Summary:12:00 - 12:50: Occupational Therapy group 12:50 -1:00 Clinician led check-out. Clinician assessed for immediate needs, medication compliance and efficacy, and safety concerns  Therapist Response:12:00 - 12:50: Patient engaged in group. See OT note. 12:50 - 1:00: At check-out, patientrates hermood at a9.5on a scale of 1-10 with 10 being great.Pt reports afternoon plans of napping and practicing distraction skills. Pt demonstrates some progress as evidenced byincreased thought challenging when escalated. Patient denies SI/HI/self-harm at the end of group.    Suicidal/Homicidal: Nowithout intent/plan  Plan: Pt will continue in PHP while working to  decrease anxiety and depression symptoms, increase emotional regulation, and increase ability to manage symptoms in a healthy manner.   Diagnosis: Severe episode of recurrent  major depressive disorder, without psychotic features (HCC) [F33.2]    1. Severe episode of recurrent major depressive disorder, without psychotic features (HCC)   2. GAD (generalized anxiety disorder)      Alicia Guiles, LCSW 05/29/2020

## 2020-05-29 NOTE — Psych (Signed)
Virtual Visit via Video Note  I connected with Alicia Phillips on 05/09/20 at  9:00 AM EDT by a video enabled telemedicine application and verified that I am speaking with the correct person using two identifiers.   Location: Patient: Patient Home Provider: Clinical Home Office   I discussed the limitations of evaluation and management by telemedicine and the availability of in person appointments. The patient expressed understanding and agreed to proceed.  I discussed the assessment and treatment plan with the patient. The patient was provided an opportunity to ask questions and all were answered. The patient agreed with the plan and demonstrated an understanding of the instructions.   The patient was advised to call back or seek an in-person evaluation if the symptoms worsen or if the condition fails to improve as anticipated.  Pt was provided 240 minutes of non-face-to-face time during this encounter.   Donia Guiles, LCSW    [facility] BH PHP THERAPIST PROGRESS NOTE  Alicia Phillips [id number]  Session Time: 9:00 - 10:00  Participation Level: Active  Behavioral Response: CasualAlertAnxious and Depressed  Type of Therapy: Group Therapy  Treatment Goals addressed: Coping  Interventions: CBT, DBT, Supportive and Reframing  Summary: Clinician led check-in regarding current stressors and situation, and review of patient completed daily inventory. Clinician utilized active listening and empathetic response and validated patient emotions. Clinician facilitated processing group on pertinent issues.   Therapist Response: Alicia Phillips is a 39 y.o. female who presents with anxiety and depression symptoms. Patient arrived within time allowed and reports that she is feeling "good." Patient rates hermood at a8.5on a scale of 1-10 with 10 being great. Pt reports continued high anxiety and struggles with physical symptoms. Pt reports struggle with consistency. Pt able to process. Pt  engaged in discussion.     Session Time: 10:00 - 11:00   Participation Level: Active  Behavioral Response: CasualAlertDepressed  Type of Therapy: Group Therapy  Treatment Goals addressed: Coping  Interventions: CBT, DBT, Supportive and Reframing  Summary: [name] led discussion on "why" and "what now" in terms of how we focus on our problems. Group members shared how focus on "why" has impacted them and barriers to focusing on "what now." Group able to process. [name] encouraged pt's to consider what "why" accomplishes.    Therapist Response:  Pt engaged in discussion and reports she tends to focus on why as a hope to resolve the issue.       Session Time: 11:00- 12:00  Participation Level:Active  Behavioral Response:CasualAlertDepressed  Type of Therapy: Group Therapy, OT  Treatment Goals addressed: Coping  Interventions:Psychosocial skills training, Supportive  Summary:Occupational Therapy group  Therapist Response:Patient engaged in group. See OT note.      Session Time: 12:00 -1:00  Participation Level:Active  Behavioral Response:CasualAlertDepressed  Type of Therapy:Group therapy  Treatment Goals addressed: Coping  Interventions:CBT; Solution focused; Supportive; Reframing  Summary:12:00 - 12:50: [name] introduced topic of positive psychology. Group discussed the 5 ways to train your brain to scan for the positive: conscious acts of kindness, meditation, exercise, postive event journaling, and gratitudes. Group members discussed how to apply the principles in their every day life.  12:50 -1:00 Clinician led check-out. Clinician assessed for immediate needs, medication compliance and efficacy, and safety concerns  Therapist Response:12:00 - 12:50: Pt engaged in discussion and reports she can start by utilizing gratitudes.   12:50 - 1:00: At check-out, patientrates hermood at a8.5on a scale of 1-10 with 10 being great.Pt  reports afternoon plans of  seeing family. Pt demonstrates some progress as evidenced by sticking with routine. Patient denies SI/HI/self-harm at the end of group.    Suicidal/Homicidal: Nowithout intent/plan  Plan: Pt will continue in PHP while working to decrease anxiety and depression symptoms, increase emotional regulation, and increase ability to manage symptoms in a healthy manner.   Diagnosis: GAD (generalized anxiety disorder) [F41.1]    1. GAD (generalized anxiety disorder)   2. Severe episode of recurrent major depressive disorder, without psychotic features (HCC)      Donia Guiles, LCSW 05/29/2020

## 2020-05-29 NOTE — Psych (Signed)
Virtual Visit via Video Note  I connected with Alicia Phillips on 05/11/20 at  9:00 AM EDT by a video enabled telemedicine application and verified that I am speaking with the correct person using two identifiers.   Location: Patient: Patient Home Provider: Clinical Home Office   I discussed the limitations of evaluation and management by telemedicine and the availability of in person appointments. The patient expressed understanding and agreed to proceed.  I discussed the assessment and treatment plan with the patient. The patient was provided an opportunity to ask questions and all were answered. The patient agreed with the plan and demonstrated an understanding of the instructions.   The patient was advised to call back or seek an in-person evaluation if the symptoms worsen or if the condition fails to improve as anticipated.  Pt was provided 240 minutes of non-face-to-face time during this encounter.   Donia Guiles, LCSW    [facility] BH PHP THERAPIST PROGRESS NOTE  Alicia Phillips [id number]  Session Time: 9:00 - 10:00  Participation Level: Active  Behavioral Response: CasualAlertDepressed  Type of Therapy: Individual Therapy  Treatment Goals addressed: Coping  Interventions: CBT, DBT, Supportive and Reframing  Summary: Clinician led check-in regarding current stressors and situation, and review of patient completed daily inventory. Clinician utilized active listening and empathetic response and validated patient emotions. Clinician facilitated processing group on pertinent issues.   Therapist Response: Alicia Phillips is a 39 y.o. female who presents with anxiety and depression symptoms. Patient arrived within time allowed and reports that she is feeling "good." Patient rates hermood at [facility] a scale of 1-10 with 10 being great. Pt reports she is feeling overall more stable. Pt states she is working on a project and bring able to focus for short periods of time. Pt states  she is taking her PRN medication 3-4 times a day to manage anxiety. Pt able to process. Pt engaged in discussion.     Session Time: 10:00 - 11:00   Participation Level: Active  Behavioral Response: CasualAlertDepressed  Type of Therapy: Individual Therapy  Treatment Goals addressed: Coping  Interventions: CBT, DBT, Supportive and Reframing  Summary: [name] led discussion on healthy relationships and the ways in which relationships can nourish or hinder our peace. [name] encouraged pt to consider a "eyes wide open" approach to relationships in which she is able to label and claim problematic components and if she accepts them to know for what reason she is accepting less than is healthy. [name] highlighted the power and agency that can come from acknowledging the struggles that we are accepting.   Therapist Response: Pt engaged in discussion and is able to process recent relationships and the way in which they played out and affected pt.       Session Time: 11:00- 12:00  Participation Level:Active  Behavioral Response:CasualAlertDepressed  Type of Therapy: Individual Therapy, OT  Treatment Goals addressed: Coping  Interventions:Psychosocial skills training, Supportive  Summary:Occupational Therapy group  Therapist Response:Patient engaged in group. See OT note.      Session Time: 12:00 -1:00  Participation Level:Active  Behavioral Response:CasualAlertDepressed  Type of Therapy:Individual therapy  Treatment Goals addressed: Coping  Interventions:CBT; Solution focused; Supportive; Reframing  Summary:12:00 - 12:50: [name] introduced topic of DBT interpersonal effectiveness skills. [name] led discussion on [facility] skill and pt increased awareness regarding problematic areas for her in practicing this skill.  12:50 -1:00 Clinician led check-out. Clinician assessed for immediate needs, medication compliance and efficacy, and safety  concerns  Therapist Response:12:00 -  12:50: Pt engaged in discussion and reports assertiveness is a struggle for her.  12:50 - 1:00: At check-out, patientrates hermood at a9.5on a scale of 1-10 with 10 being great.Pt reports afternoon plans of going to cardiac rehab. Pt demonstrates some progress as evidenced by report she has not cried in 3-4 days. Patient denies SI/HI/self-harm at the end of group.    Suicidal/Homicidal: Nowithout intent/plan  Plan: Pt will continue in PHP while working to decrease anxiety and depression symptoms, increase emotional regulation, and increase ability to manage symptoms in a healthy manner.   Diagnosis: Severe episode of recurrent major depressive disorder, without psychotic features (HCC) [F33.2]    1. Severe episode of recurrent major depressive disorder, without psychotic features (HCC)   2. GAD (generalized anxiety disorder)      Donia Guiles, LCSW 05/29/2020

## 2020-05-30 ENCOUNTER — Other Ambulatory Visit (HOSPITAL_COMMUNITY): Payer: BC Managed Care – PPO

## 2020-05-30 ENCOUNTER — Other Ambulatory Visit: Payer: Self-pay

## 2020-05-30 DIAGNOSIS — F4311 Post-traumatic stress disorder, acute: Secondary | ICD-10-CM | POA: Diagnosis not present

## 2020-05-30 DIAGNOSIS — F4010 Social phobia, unspecified: Secondary | ICD-10-CM | POA: Diagnosis not present

## 2020-05-30 DIAGNOSIS — F330 Major depressive disorder, recurrent, mild: Secondary | ICD-10-CM | POA: Diagnosis not present

## 2020-05-30 DIAGNOSIS — F410 Panic disorder [episodic paroxysmal anxiety] without agoraphobia: Secondary | ICD-10-CM | POA: Diagnosis not present

## 2020-05-31 ENCOUNTER — Encounter (HOSPITAL_COMMUNITY): Payer: Self-pay | Admitting: Psychiatry

## 2020-05-31 ENCOUNTER — Other Ambulatory Visit (HOSPITAL_COMMUNITY): Payer: BC Managed Care – PPO | Attending: Psychiatry | Admitting: Psychiatry

## 2020-05-31 ENCOUNTER — Encounter: Payer: Self-pay | Admitting: *Deleted

## 2020-05-31 ENCOUNTER — Other Ambulatory Visit: Payer: Self-pay

## 2020-05-31 DIAGNOSIS — F3181 Bipolar II disorder: Secondary | ICD-10-CM | POA: Diagnosis not present

## 2020-05-31 DIAGNOSIS — F411 Generalized anxiety disorder: Secondary | ICD-10-CM | POA: Diagnosis not present

## 2020-05-31 DIAGNOSIS — F332 Major depressive disorder, recurrent severe without psychotic features: Secondary | ICD-10-CM | POA: Diagnosis not present

## 2020-05-31 DIAGNOSIS — Z955 Presence of coronary angioplasty implant and graft: Secondary | ICD-10-CM

## 2020-05-31 DIAGNOSIS — I214 Non-ST elevation (NSTEMI) myocardial infarction: Secondary | ICD-10-CM

## 2020-05-31 NOTE — Progress Notes (Signed)
Virtual Visit via Video Note  I connected with Alicia Phillips on 05/31/20 at  9:00 AM EDT by a video enabled telemedicine application and verified that I am speaking with the correct person using two identifiers.  At orientation to the IOP program, Case Manager discussed the limitations of evaluation and management by telemedicine and the availability of in person appointments. The patient expressed understanding and agreed to proceed with virtual visits throughout the duration of the program.   Location:  Patient: Patient Home Provider: Home Office   History of Present Illness: Bipolar 2 DO  Observations/Objective: Check In: Case Manager checked in with all participants to review discharge dates, insurance authorizations, work-related documents and needs for the treatment team.    Initial Therapeutic Activity: Counselor facilitated a check-in with group members to assess mood and current functioning. Client reports that she is learning to resist emotional response to body sensations, learning to think rationally about symptoms. Client notes feeling tired, with sickness coming on. Client reports sad feelings about condition of her pet. Client notes that overall she is feeling in a better place emotionally and mentally. Client presents with moderate depression and moderate anxiety. Client denied any current SI/HI/psychosis.  Second Therapeutic Activity: Counselor introduced our guest speaker, Alicia Phillips, [employer] Pharmacist, who shared about psychiatric medications, side effects, treatment considerations and how to communicate with medical professionals. Group Members asked questions and shared medication concerns. Counselor prompted group members to reference a worksheet called, "Body Scan" to jot down questions and concerns about their physical health in preparation for their upcoming appointments with medical professionals. Client to follow up with dermatologist, eye surgeon, and cardiac rehab.  Counselor encouraged routine medical check-ups, preparing for appointments, following up with recommendations and seeking specialist if needed.  Check Out: Counselor closed program by prompting group members to share one self-care practice or productivity activity they can complete today to reduce anxiety. Client stated that they plan to communicate with cardiac rehab to determine if she should stay home or attend appointment, due to onset of cold like symptoms. Client indicated that they are not currently a risk to self of others before signing off.  Assessment and Plan: Clinician recommends that Client remain in IOP treatment to better manage mental health symptoms, stabilization and to address treatment plan goals. Clinician recommends adherence to crisis/safety plan, taking medications as prescribed, and following up with medical professionals if any issues arise.   Follow Up Instructions: Clinician will send Webex link for next session. The Client was advised to call back or seek an in-person evaluation if the symptoms worsen or if the condition fails to improve as anticipated.     I provided 180 minutes of non-face-to-face time during this encounter.     Hilbert Odor, LCSW

## 2020-05-31 NOTE — Progress Notes (Signed)
Cardiac Individual Treatment Plan  Patient Details  Name: Alicia Phillips MRN: [id number] Date of Birth: 05/08/1981 Referring Provider:    Initial Encounter Date:    Cardiac Rehab from 05/11/2020 in [facility] Cardiac and Pulmonary Rehab  Date 05/11/20      Visit Diagnosis: NSTEMI (non-ST elevation myocardial infarction) [facility])  Status post coronary artery stent placement  Patient's Home Medications on Admission:  Current Outpatient Medications:  .  ACETAMINOPHEN EXTRA STRENGTH 500 MG tablet, Take 500 mg by mouth every 4 (four) hours as needed for mild pain or headache. , Disp: , Rfl:  .  ARIPiprazole (ABILIFY) 10 MG tablet, Take 1 tablet (10 mg total) by mouth daily., Disp: 30 tablet, Rfl: 2 .  ARIPiprazole (ABILIFY) 5 MG tablet, Take 1 tablet (5 mg total) by mouth daily., Disp: 30 tablet, Rfl: 0 .  aspirin 81 MG chewable tablet, Chew 1 tablet (81 mg total) by mouth daily., Disp: , Rfl:  .  atorvastatin (LIPITOR) 80 MG tablet, Take 1 tablet (80 mg total) by mouth daily., Disp: 90 tablet, Rfl: 3 .  Continuous Blood Gluc Sensor (FREESTYLE LIBRE 14 DAY SENSOR) MISC, Apply topically as directed., Disp: , Rfl:  .  doxycycline (VIBRAMYCIN) 50 MG capsule, Take 1 capsule (50 mg total) by mouth daily., Disp: 30 capsule, Rfl: 0 .  INPEN 100-PINK-NOVO DEVI, 3 (three) times daily., Disp: , Rfl:  .  insulin aspart (NOVOLOG) 100 UNIT/ML injection, Inject 0-20 Units into the skin 3 (three) times daily before meals. Per sliding scale, Disp: , Rfl:  .  lamoTRIgine (LAMICTAL) 100 MG tablet, Take 1 tablet (100 mg total) by mouth at bedtime. Take one tablet at bedtime., Disp: 30 tablet, Rfl: 1 .  metoprolol succinate (TOPROL-XL) 25 MG 24 hr tablet, Take 1 tablet (25 mg total) by mouth daily. Take with or immediately following a meal. (Patient taking differently: Take 25 mg by mouth in the morning and at bedtime. Take with or immediately following a meal.), Disp: 90 tablet, Rfl: 3 .  nitroGLYCERIN (NITROSTAT)  0.4 MG SL tablet, Place 1 tablet (0.4 mg total) under the tongue every 5 (five) minutes as needed for chest pain., Disp: 25 tablet, Rfl: 2 .  NOVOFINE PEN NEEDLE 32G X 6 MM MISC, SMARTSIG:Injection 5 Times Daily, Disp: , Rfl:  .  ticagrelor (BRILINTA) 90 MG TABS tablet, Take 1 tablet (90 mg total) by mouth 2 (two) times daily., Disp: 60 tablet, Rfl: 11 .  TRESIBA FLEXTOUCH 100 UNIT/ML FlexTouch Pen, Inject 25 Units into the skin daily. , Disp: , Rfl:   Current Facility-Administered Medications:  .  iron polysaccharides (NIFEREX) capsule 150 mg, 150 mg, Oral, Daily, [facility], Tami Lin, PA  Past Medical History: Past Medical History:  Diagnosis Date  . Anxiety   . Bipolar disorder ([place])   . CAD (coronary artery disease)    a. LHC 04/05/20: 95% stenosis of mid LAD s/p DES, 30% stenosis of proximal to mid LAD  . Diabetes mellitus type 1 ([place])   . History of borderline personality disorder   . Hyperlipidemia   . Major depression   . STEMI (ST elevation myocardial infarction) ([place]) 04/05/2020  . Tobacco use     Tobacco Use: Social History   Tobacco Use  Smoking Status Former Smoker  Smokeless Tobacco Never Used  Tobacco Comment   quit after  heartattack Sept 2021    Labs: Recent Review [facility] for [facility] Cardiac and Pulmonary Rehab Latest Ref Rng &  Units 04/06/2020   Cholestrol 0 - 200 mg/dL 193   LDLCALC 0 - 99 mg/dL 124(H)   HDL >40 mg/dL 50   Trlycerides <150 mg/dL 96   Hemoglobin A1c 4.8 - 5.6 % 8.6(H)       Exercise Target Goals: Exercise Program Goal: Individual exercise prescription set using results from initial 6 min walk test and THRR while considering  patient's activity barriers and safety.   Exercise Prescription Goal: Initial exercise prescription builds to 30-45 minutes a day of aerobic activity, 2-3 days per week.  Home exercise guidelines will be given to patient during program as part of exercise prescription that the participant will  acknowledge.   Education: Aerobic Exercise & Resistance Training: - Gives group verbal and written instruction on the various components of exercise. Focuses on aerobic and resistive training programs and the benefits of this training and how to safely progress through these programs..   Cardiac Rehab from 05/24/2020 in [facility] Cardiac and Pulmonary Rehab  Date 05/24/20  [resistance 10/27/201]  Educator [facility]  Instruction Review Code 1- [employer] Understanding      Education: Exercise & Equipment Safety: - Individual verbal instruction and demonstration of equipment use and safety with use of the equipment.   Cardiac Rehab from 05/24/2020 in [facility] Cardiac and Pulmonary Rehab  Date 05/11/20  Educator [place]  Instruction Review Code 1- Verbalizes Understanding      Education: Exercise Physiology & General Exercise Guidelines: - Group verbal and written instruction with models to review the exercise physiology of the cardiovascular system and associated critical values. Provides general exercise guidelines with specific guidelines to those with heart or lung disease.    Education: Flexibility, Balance, Mind/Body Relaxation: Provides group verbal/written instruction on the benefits of flexibility and balance training, including mind/body exercise modes such as yoga, pilates and tai chi.  Demonstration and skill practice provided.   Activity Barriers & Risk Stratification:  Activity Barriers & Cardiac Risk Stratification - 05/11/20 1654      Activity Barriers & Cardiac Risk Stratification   Activity Barriers None    Cardiac Risk Stratification High           6 Minute Walk:  6 Minute Walk    Row Name 05/11/20 1614         6 Minute Walk   Phase Initial     Distance 1287 feet     Walk Time 6 minutes     # of Rest Breaks 0     MPH 2.43     METS 4.6     RPE 8     Perceived Dyspnea  0     VO2 Peak 16.1     Symptoms No     Resting HR 86 bpm     Resting BP 116/78     Resting  Oxygen Saturation  99 %     Exercise Oxygen Saturation  during 6 min walk 98 %     Max Ex. HR 97 bpm     Max Ex. BP 122/74     2 Minute Post BP 118/76            Oxygen Initial Assessment:   Oxygen Re-Evaluation:   Oxygen Discharge (Final Oxygen Re-Evaluation):   Initial Exercise Prescription:  Initial Exercise Prescription - 05/11/20 1600      Date of Initial Exercise RX and Referring Provider   Date 05/11/20      Treadmill   MPH 2.4    Grade 1  Minutes 15    METs 3.17      Recumbant Elliptical   Level 2    RPM 50    Minutes 15    METs 4.6      Elliptical   Level 1    Speed 2.1    Minutes 15    METs 4.6      REL-XR   Level 3    Speed 50    Minutes 15    METs 4.6      Prescription Details   Frequency (times per week) 3    Duration Progress to 30 minutes of continuous aerobic without signs/symptoms of physical distress      Intensity   THRR 40-80% of Max Heartrate 124-162    Ratings of Perceived Exertion 11-13    Perceived Dyspnea 0-4      Progression   Progression Continue to progress workloads to maintain intensity without signs/symptoms of physical distress.      Resistance Training   Training Prescription Yes           Perform Capillary Blood Glucose checks as needed.  Exercise Prescription Changes:  Exercise Prescription Changes    Row Name 05/11/20 1600             Response to Exercise   Blood Pressure (Admit) 116/78       Blood Pressure (Exercise) 122/74       Blood Pressure (Exit) 118/76       Heart Rate (Admit) 86 bpm       Heart Rate (Exercise) 97 bpm       Heart Rate (Exit) 88 bpm       Oxygen Saturation (Admit) 99 %       Oxygen Saturation (Exercise) 98 %       Oxygen Saturation (Exit) 98 %       Rating of Perceived Exertion (Exercise) 8       Perceived Dyspnea (Exercise) 0       Symptoms none       Comments walk test results       Duration Progress to 30 minutes of  aerobic without signs/symptoms of physical  distress         Resistance Training   Weight 4 lb       Reps 10-15              Exercise Comments:   Exercise Goals and Review:  Exercise Goals    Row Name 05/11/20 1659             Exercise Goals   Increase Physical Activity Yes       Intervention Provide advice, education, support and counseling about physical activity/exercise needs.;Develop an individualized exercise prescription for aerobic and resistive training based on initial evaluation findings, risk stratification, comorbidities and participant's personal goals.       Expected Outcomes Short Term: Attend rehab on a regular basis to increase amount of physical activity.;Long Term: Exercising regularly at least 3-5 days a week.;Long Term: Add in home exercise to make exercise part of routine and to increase amount of physical activity.       Increase Strength and Stamina Yes       Intervention Provide advice, education, support and counseling about physical activity/exercise needs.;Develop an individualized exercise prescription for aerobic and resistive training based on initial evaluation findings, risk stratification, comorbidities and participant's personal goals.       Expected Outcomes Short Term: Increase workloads from initial exercise prescription for  resistance, speed, and METs.;Short Term: Perform resistance training exercises routinely during rehab and add in resistance training at home;Long Term: Improve cardiorespiratory fitness, muscular endurance and strength as measured by increased METs and functional capacity (6MWT)       Able to understand and use rate of perceived exertion (RPE) scale Yes       Intervention Provide education and explanation on how to use RPE scale       Expected Outcomes Short Term: Able to use RPE daily in rehab to express subjective intensity level;Long Term:  Able to use RPE to guide intensity level when exercising independently       Able to understand and use Dyspnea scale Yes        Intervention Provide education and explanation on how to use Dyspnea scale       Expected Outcomes Short Term: Able to use Dyspnea scale daily in rehab to express subjective sense of shortness of breath during exertion;Long Term: Able to use Dyspnea scale to guide intensity level when exercising independently       Knowledge and understanding of Target Heart Rate Range (THRR) Yes       Intervention Provide education and explanation of THRR including how the numbers were predicted and where they are located for reference       Expected Outcomes Short Term: Able to state/look up THRR;Long Term: Able to use THRR to govern intensity when exercising independently;Short Term: Able to use daily as guideline for intensity in rehab       Able to check pulse independently Yes       Intervention Provide education and demonstration on how to check pulse in carotid and radial arteries.;Review the importance of being able to check your own pulse for safety during independent exercise       Expected Outcomes Short Term: Able to explain why pulse checking is important during independent exercise;Long Term: Able to check pulse independently and accurately       Understanding of Exercise Prescription Yes       Intervention Provide education, explanation, and written materials on patient's individual exercise prescription       Expected Outcomes Short Term: Able to explain program exercise prescription;Long Term: Able to explain home exercise prescription to exercise independently              Exercise Goals Re-Evaluation :  Exercise Goals Re-Evaluation    Row Name 05/17/20 1658             Exercise Goal Re-Evaluation   Exercise Goals Review Increase Physical Activity;Able to understand and use rate of perceived exertion (RPE) scale;Knowledge and understanding of Target Heart Rate Range (THRR);Understanding of Exercise Prescription;Increase Strength and Stamina;Able to check pulse independently       Comments  Reviewed RPE and dyspnea scales, THR and program prescription with pt today.  Pt voiced understanding and was given a copy of goals to take home.       Expected Outcomes Short: Use RPE daily to regulate intensity. Long: Follow program prescription in THR.              Discharge Exercise Prescription (Final Exercise Prescription Changes):  Exercise Prescription Changes - 05/11/20 1600      Response to Exercise   Blood Pressure (Admit) 116/78    Blood Pressure (Exercise) 122/74    Blood Pressure (Exit) 118/76    Heart Rate (Admit) 86 bpm    Heart Rate (Exercise) 97 bpm    Heart Rate (  Exit) 88 bpm    Oxygen Saturation (Admit) 99 %    Oxygen Saturation (Exercise) 98 %    Oxygen Saturation (Exit) 98 %    Rating of Perceived Exertion (Exercise) 8    Perceived Dyspnea (Exercise) 0    Symptoms none    Comments walk test results    Duration Progress to 30 minutes of  aerobic without signs/symptoms of physical distress      Resistance Training   Weight 4 lb    Reps 10-15           Nutrition:  Target Goals: Understanding of nutrition guidelines, daily intake of sodium <1538m, cholesterol <2042m calories 30% from fat and 7% or less from saturated fats, daily to have 5 or more servings of fruits and vegetables.  Education: Controlling Sodium/Reading Food Labels -Group verbal and written material supporting the discussion of sodium use in heart healthy nutrition. Review and explanation with models, verbal and written materials for utilization of the food label.   Education: General Nutrition Guidelines/Fats and Fiber: -Group instruction provided by verbal, written material, models and posters to present the general guidelines for heart healthy nutrition. Gives an explanation and review of dietary fats and fiber.   Biometrics:  Pre Biometrics - 05/11/20 1613      Pre Biometrics   Height 5' 7.5" (1.715 m)    Weight 165 lb (74.8 kg)    BMI (Calculated) 25.45    Single Leg Stand  30 seconds            Nutrition Therapy Plan and Nutrition Goals:   Nutrition Assessments:  Nutrition Assessments - 05/11/20 1604      MEDFICTS Scores   Pre Score 32           MEDIFICTS Score Key:          ?70 Need to make dietary changes          40-70 Heart Healthy Diet         ? 40 Therapeutic Level Cholesterol Diet  Nutrition Goals Re-Evaluation:   Nutrition Goals Discharge (Final Nutrition Goals Re-Evaluation):   Psychosocial: Target Goals: Acknowledge presence or absence of significant depression and/or stress, maximize coping skills, provide positive support system. Participant is able to verbalize types and ability to use techniques and skills needed for reducing stress and depression.   Education: Depression - Provides group verbal and written instruction on the correlation between heart/lung disease and depressed mood, treatment options, and the stigmas associated with seeking treatment.   Education: Sleep Hygiene -Provides group verbal and written instruction about how sleep can affect your health.  Define sleep hygiene, discuss sleep cycles and impact of sleep habits. Review good sleep hygiene tips.     Education: Stress and Anxiety: - Provides group verbal and written instruction about the health risks of elevated stress and causes of high stress.  Discuss the correlation between heart/lung disease and anxiety and treatment options. Review healthy ways to manage with stress and anxiety.    Initial Review & Psychosocial Screening:  Initial Psych Review & Screening - 05/05/20 0944      Initial Review   Current issues with Current Anxiety/Panic;Current Stress Concerns;Current Sleep Concerns;History of Depression;Current Psychotropic Meds    Source of Stress Concerns --    Comments Post MI, history of anxiety/depression      Family Dynamics   Good Support System? Yes      Barriers   Psychosocial barriers to participate in program There are no  identifiable barriers or psychosocial needs.;The patient should benefit from training in stress management and relaxation.      Screening Interventions   Interventions Encouraged to exercise;Provide feedback about the scores to participant;To provide support and resources with identified psychosocial needs    Expected Outcomes Short Term goal: Utilizing psychosocial counselor, staff and physician to assist with identification of specific Stressors or current issues interfering with healing process. Setting desired goal for each stressor or current issue identified.;Long Term Goal: Stressors or current issues are controlled or eliminated.;Short Term goal: Identification and review with participant of any Quality of Life or Depression concerns found by scoring the questionnaire.;Long Term goal: The participant improves quality of Life and PHQ9 Scores as seen by post scores and/or verbalization of changes           Quality of Life Scores:   Quality of Life - 05/11/20 1603      Quality of Life   Select Quality of Life      Quality of Life Scores   Health/Function Pre 12.53 %    Socioeconomic Pre 20.5 %    Psych/Spiritual Pre 15 %    Family Pre 23 %    GLOBAL Pre 15.8 %          Scores of 19 and below usually indicate a poorer quality of life in these areas.  A difference of  2-3 points is a clinically meaningful difference.  A difference of 2-3 points in the total score of the Quality of Life Index has been associated with significant improvement in overall quality of life, self-image, physical symptoms, and general health in studies assessing change in quality of life.  PHQ-9: Recent Review Flowsheet Data    Depression screen [facility] 2/9 05/11/2020   Decreased Interest 1   Down, Depressed, Hopeless 2   PHQ - 2 Score 3   Altered sleeping 2   Tired, decreased energy 1   Change in appetite 1   Feeling bad or failure about yourself  0   Trouble concentrating 0   Moving slowly or  fidgety/restless 0   Suicidal thoughts 0   PHQ-9 Score 7   Difficult doing work/chores Somewhat difficult     Interpretation of Total Score  Total Score Depression Severity:  1-4 = Minimal depression, 5-9 = Mild depression, 10-14 = Moderate depression, 15-19 = Moderately severe depression, 20-27 = Severe depression   Psychosocial Evaluation and Intervention:  Psychosocial Evaluation - 05/05/20 1004      Psychosocial Evaluation & Interventions   Comments Alicia Phillips is working very hard post MI to get back into a good mental state. She had her MI while trying to relax, so she is currently taking anxiety meds to help sleep. She is seeing a therapist for anxiety, attending relaxation classes, and has been given an extension from her work to be out til December. She is really looking forward to coming to Cardiac Rehab so she can learn more about her health. She quit smoking after her heart attack is very motivated to stay away from smoking. Her treatment for diabetes has had to go through recent changes in response to her fluctuating CBG readings.    Expected Outcomes Short: attend cardiac rehab for education and exercise. Long: maintain positive self care habits.    Continue Psychosocial Services  Follow up required by staff           Psychosocial Re-Evaluation:   Psychosocial Discharge (Final Psychosocial Re-Evaluation):   Vocational Rehabilitation: Provide vocational rehab assistance  to qualifying candidates.   Vocational Rehab Evaluation & Intervention:  Vocational Rehab - 05/05/20 0942      Initial Vocational Rehab Evaluation & Intervention   Assessment shows need for Vocational Rehabilitation No           Education: Education Goals: Education classes will be provided on a variety of topics geared toward better understanding of heart health and risk factor modification. Participant will state understanding/return demonstration of topics presented as noted by education test  scores.  Learning Barriers/Preferences:  Learning Barriers/Preferences - 05/05/20 0942      Learning Barriers/Preferences   Learning Barriers None    Learning Preferences None           General Cardiac Education Topics:  AED/CPR: - Group verbal and written instruction with the use of models to demonstrate the basic use of the AED with the basic ABC's of resuscitation.   Anatomy & Physiology of the Heart: - Group verbal and written instruction and models provide basic cardiac anatomy and physiology, with the coronary electrical and arterial systems. Review of Valvular disease and Heart Failure   Cardiac Rehab from 05/24/2020 in [facility] Cardiac and Pulmonary Rehab  Date 05/24/20  Educator [facility]  Instruction Review Code 1- Verbalizes Understanding      Cardiac Procedures: - Group verbal and written instruction to review commonly prescribed medications for heart disease. Reviews the medication, class of the drug, and side effects. Includes the steps to properly store meds and maintain the prescription regimen. (beta blockers and nitrates)   Cardiac Rehab from 05/24/2020 in [facility] Cardiac and Pulmonary Rehab  Date 05/24/20  Educator [facility]  Instruction Review Code 1- Verbalizes Understanding      Cardiac Medications I: - Group verbal and written instruction to review commonly prescribed medications for heart disease. Reviews the medication, class of the drug, and side effects. Includes the steps to properly store meds and maintain the prescription regimen.   Cardiac Medications II: -Group verbal and written instruction to review commonly prescribed medications for heart disease. Reviews the medication, class of the drug, and side effects. (all other drug classes)    Go Sex-Intimacy & Heart Disease, Get SMART - Goal Setting: - Group verbal and written instruction through game format to discuss heart disease and the return to sexual intimacy. Provides group verbal and written material to  discuss and apply goal setting through the application of the S.M.A.R.T. Method.   Cardiac Rehab from 05/24/2020 in [facility] Cardiac and Pulmonary Rehab  Date 05/24/20  Educator [facility]  Instruction Review Code 1- Verbalizes Understanding      Other Matters of the Heart: - Provides group verbal, written materials and models to describe Stable Angina and Peripheral Artery. Includes description of the disease process and treatment options available to the cardiac patient.   Infection Prevention: - Provides verbal and written material to individual with discussion of infection control including proper hand washing and proper equipment cleaning during exercise session.   Cardiac Rehab from 05/24/2020 in [facility] Cardiac and Pulmonary Rehab  Date 05/11/20  Educator [place]  Instruction Review Code 1- Verbalizes Understanding      Falls Prevention: - Provides verbal and written material to individual with discussion of falls prevention and safety.   Cardiac Rehab from 05/24/2020 in [facility] Cardiac and Pulmonary Rehab  Date 05/11/20  Educator [place]  Instruction Review Code 1- Verbalizes Understanding      Other: -Provides group and verbal instruction on various topics (see comments)   Knowledge Questionnaire Score:  Knowledge Questionnaire Score - 05/11/20 1702      Knowledge Questionnaire Score   Pre Score 26/26           Core Components/Risk Factors/Patient Goals at Admission:  Personal Goals and Risk Factors at Admission - 05/11/20 1700      Core Components/Risk Factors/Patient Goals on Admission    Weight Management Yes;Weight Maintenance    Intervention Weight Management/Obesity: Establish reasonable short term and long term weight goals.;Weight Management: Provide education and appropriate resources to help participant work on and attain dietary goals.;Weight Management: Develop a combined nutrition and exercise program designed to reach desired caloric intake, while maintaining appropriate  intake of nutrient and fiber, sodium and fats, and appropriate energy expenditure required for the weight goal.    Admit Weight 165 lb (74.8 kg)    Goal Weight: Short Term 165 lb (74.8 kg)    Goal Weight: Long Term 165 lb (74.8 kg)    Expected Outcomes Short Term: Continue to assess and modify interventions until short term weight is achieved;Weight Maintenance: Understanding of the daily nutrition guidelines, which includes 25-35% calories from fat, 7% or less cal from saturated fats, less than 285m cholesterol, less than 1.5gm of sodium, & 5 or more servings of fruits and vegetables daily;Understanding recommendations for meals to include 15-35% energy as protein, 25-35% energy from fat, 35-60% energy from carbohydrates, less than 2052mof dietary cholesterol, 20-35 gm of total fiber daily;Understanding of distribution of calorie intake throughout the day with the consumption of 4-5 meals/snacks    Tobacco Cessation Yes    Intervention Offer self-teaching materials, assist with locating and accessing local/national Quit Smoking programs, and support quit date choice.;Assist the participant in steps to quit. Provide individualized education and counseling about committing to Tobacco Cessation, relapse prevention, and pharmacological support that can be provided by physician.    Expected Outcomes Short Term: Will demonstrate readiness to quit, by selecting a quit date.;Long Term: Complete abstinence from all tobacco products for at least 12 months from quit date.;Short Term: Will quit all tobacco product use, adhering to prevention of relapse plan.    Diabetes Yes    Intervention Provide education about signs/symptoms and action to take for hypo/hyperglycemia.;Provide education about proper nutrition, including hydration, and aerobic/resistive exercise prescription along with prescribed medications to achieve blood glucose in normal ranges: Fasting glucose 65-99 mg/dL    Expected Outcomes Short Term:  Participant verbalizes understanding of the signs/symptoms and immediate care of hyper/hypoglycemia, proper foot care and importance of medication, aerobic/resistive exercise and nutrition plan for blood glucose control.;Long Term: Attainment of HbA1C < 7%.    Hypertension Yes    Intervention Provide education on lifestyle modifcations including regular physical activity/exercise, weight management, moderate sodium restriction and increased consumption of fresh fruit, vegetables, and low fat dairy, alcohol moderation, and smoking cessation.;Monitor prescription use compliance.    Expected Outcomes Short Term: Continued assessment and intervention until BP is < 140/9081mG in hypertensive participants. < 130/78m63m in hypertensive participants with diabetes, heart failure or chronic kidney disease.;Long Term: Maintenance of blood pressure at goal levels.    Lipids Yes    Intervention Provide education and support for participant on nutrition & aerobic/resistive exercise along with prescribed medications to achieve LDL <70mg29mL >40mg.60mExpected Outcomes Short Term: Participant states understanding of desired cholesterol values and is compliant with medications prescribed. Participant is following exercise prescription and nutrition guidelines.;Long Term: Cholesterol controlled with medications as prescribed, with individualized exercise RX and with  personalized nutrition plan. Value goals: LDL < 42m, HDL > 40 mg.           Education:Diabetes - Individual verbal and written instruction to review signs/symptoms of diabetes, desired ranges of glucose level fasting, after meals and with exercise. Acknowledge that pre and post exercise glucose checks will be done for 3 sessions at entry of program.   [facility]  Date 05/05/20  [facility] Code 1- VUnited States Steel CorporationUnderstanding      Education: Know Your Numbers and Risk  Factors: -Group verbal and written instruction about important numbers in your health.  Discussion of what are risk factors and how they play a role in the disease process.  Review of Cholesterol, Blood Pressure, Diabetes, and BMI and the role they play in your overall health.   Core Components/Risk Factors/Patient Goals Review:    Core Components/Risk Factors/Patient Goals at Discharge (Final Review):    ITP Comments:  ITP Comments    Row Name 05/05/20 0936 05/11/20 1645 05/11/20 1646 05/17/20 1654 05/31/20 0610   ITP Comments Initial telephone orientation completed. [facility].EP orientation scheduled for 10/14 at 2pm Completed 6MWT and gym orientation. Initial ITP created and sent for review to Dr. MEmily Filbert Medical Director. EKazandrais a current tobacco user. Intervention for tobacco cessation was provided at the initial medical review. She was asked about readiness to quit. EEmilynnstated she had quit before but started up again. She is up to 5 cigarettes/day and is ready to quit again. We provided her with a fake cigarette to help cope with the oral fixation. Patient was advised and educated about tobacco cessation using combination therapy, tobacco cessation classes, quit line, and quit smoking apps. Patient demonstrated understanding of this material. Staff will continue to provide encouragement and follow up with the patient throughout the program. First full day of exercise!  Patient was oriented to gym and equipment including functions, settings, policies, and procedures.  Patient's individual exercise prescription and treatment plan were reviewed.  All starting workloads were established based on the results of the 6 minute walk test done at initial orientation visit.  The plan for exercise progression was also introduced and progression will be customized based on patient's performance and goals. 30 Day review completed. Medical Director ITP review done, changes made  as directed, and signed approval by Medical Director.          Comments:

## 2020-06-01 ENCOUNTER — Telehealth: Payer: Self-pay

## 2020-06-01 ENCOUNTER — Encounter (HOSPITAL_COMMUNITY): Payer: Self-pay | Admitting: Psychiatry

## 2020-06-01 ENCOUNTER — Other Ambulatory Visit: Payer: Self-pay

## 2020-06-01 ENCOUNTER — Other Ambulatory Visit (HOSPITAL_COMMUNITY): Payer: BC Managed Care – PPO | Admitting: Psychiatry

## 2020-06-01 DIAGNOSIS — F332 Major depressive disorder, recurrent severe without psychotic features: Secondary | ICD-10-CM | POA: Diagnosis not present

## 2020-06-01 DIAGNOSIS — F3181 Bipolar II disorder: Secondary | ICD-10-CM | POA: Diagnosis not present

## 2020-06-01 DIAGNOSIS — F411 Generalized anxiety disorder: Secondary | ICD-10-CM | POA: Diagnosis not present

## 2020-06-01 NOTE — Progress Notes (Signed)
Virtual Visit via Video Note  I connected with Alicia Phillips on 06/01/20 at  9:00 AM EDT by a video enabled telemedicine application and verified that I am speaking with the correct person using two identifiers.  At orientation to the IOP program, Case Manager discussed the limitations of evaluation and management by telemedicine and the availability of in person appointments. The patient expressed understanding and agreed to proceed with virtual visits throughout the duration of the program.   Location:  Patient: Patient Home Provider: Home Office   History of Present Illness: Bipolar 2 DO  Observations/Objective: Check In: Case Manager checked in with all participants to review discharge dates, insurance authorizations, work-related documents and needs for the treatment team. Counselor facilitated a check-in with group members to assess mood and current functioning. Client reports that she is applying self-care through resting and getting covid tested, since she unexplainably started feeling unwell yesteday. Client reports that she notices progress in reaction to physical symptoms, by examining the evidence and controlling what she can control. Client presents with ild depression and moderate anxiety. Client denied any current SI/HI/psychosis.   Initial Therapeutic Activity: Counselor introduced [employer], [facility] to present information and discussion on Grief and Loss. Group members engaged in discussion, sharing how grief impacts them, what comforts them, what emotions are felt, labeling losses, etc. After guest speaker logged off, Counselor prompted group to spend 10-15 minutes journaling to process personal grief and loss situations. Counselor processed entries with group and client's identified areas for additional processing in individual therapy. Client noted losses associated with her relationship with her body, anticipatory grief about her career and parts of her identity.    Second Therapeutic Activity: Counselor introduced the topic of Self-Compassion to the group by prompting them to share their definition. Client believes it is to be gentle with yourself. Counselor provided group with a link to complete a Self-Compassion assessment from https://www.moore.com/. Client reports that she score a 1.64 out of 5, 5 being a healthy level of self-compassion, 0 unhealthy level. Counselor played a video by Dr. Haze Rushing, leading researcher in Self-Compassion. Client shared takeaways, that focusing of self-compassion is more helpful than focusing on self-esteem. She noted that this feels like a big shift in perspective and practice, but sees the importance.   Check Out: Counselor closed program by walking the group through a self-soothing expercise, as a self-compassion skill building practice. Client noted that hands over heart and hand over heart and belly touch was the most soothing for her. Client expressed appreciation for the activity and information. Client indicated that they are not currently a risk to self of others before signing off.  Assessment and Plan: Clinician recommends that Client remain in IOP treatment to better manage mental health symptoms, stabilization and to address treatment plan goals. Clinician recommends adherence to crisis/safety plan, taking medications as prescribed, and following up with medical professionals if any issues arise.   Follow Up Instructions: Clinician will send Webex link for next session. The Client was advised to call back or seek an in-person evaluation if the symptoms worsen or if the condition fails to improve as anticipated.     I provided 180 minutes of non-face-to-face time during this encounter.     Hilbert Odor, LCSW

## 2020-06-01 NOTE — Telephone Encounter (Signed)
Patient called and reported she had a fever and is getting COVID tested. Will cancel [facility] appointments until we hear on results. Patient agreed.

## 2020-06-01 NOTE — Patient Instructions (Signed)
D:  Patient will complete MH-IOP tomorrow (06-02-20).  A:  Follow-up with [facility].  Encouraged support groups.  R:  Patient receptive.

## 2020-06-02 ENCOUNTER — Other Ambulatory Visit (HOSPITAL_COMMUNITY): Payer: BC Managed Care – PPO | Admitting: Psychiatry

## 2020-06-02 ENCOUNTER — Encounter (HOSPITAL_COMMUNITY): Payer: Self-pay | Admitting: Psychiatry

## 2020-06-02 ENCOUNTER — Other Ambulatory Visit: Payer: Self-pay

## 2020-06-02 DIAGNOSIS — F411 Generalized anxiety disorder: Secondary | ICD-10-CM | POA: Diagnosis not present

## 2020-06-02 DIAGNOSIS — F3181 Bipolar II disorder: Secondary | ICD-10-CM | POA: Diagnosis not present

## 2020-06-02 DIAGNOSIS — F332 Major depressive disorder, recurrent severe without psychotic features: Secondary | ICD-10-CM | POA: Diagnosis not present

## 2020-06-02 NOTE — Progress Notes (Signed)
Virtual Visit via Telephone Note  I connected with CATINA NUSS on 06/02/20 at  9:00 AM EDT by telephone and verified that I am speaking with the correct person using two identifiers.  Location: Patient: Home Provider: Home   I discussed the limitations, risks, security and privacy concerns of performing an evaluation and management service by telephone and the availability of in person appointments. I also discussed with the patient that there may be a patient responsible charge related to this service. The patient expressed understanding and agreed to proceed.   I discussed the assessment and treatment plan with the patient. The patient was provided an opportunity to ask questions and all were answered. The patient agreed with the plan and demonstrated an understanding of the instructions.   The patient was advised to call back or seek an in-person evaluation if the symptoms worsen or if the condition fails to improve as anticipated.  I provided 15 minutes of non-face-to-face time during this encounter.   [name], NP   [facility] Intensive Outpatient Program Discharge Summary  PRAIRIE STENBERG [id number]  Admission date:05/15/2020 Discharge date: 06/01/2020  Reason for admission: Per admission assessment note: Davida Falconi is an 39 year old Caucasian female.  Reported history of major depression disorder, generalized anxiety and bipolar disorder.  Patient was admitted to partial hospitalization programming after multiple panic attacks due to suffering from a heart attack few months prior.  Jasey reports she continues to struggle with depression, fear and thoughts of  impending doom. She reported ongoing ruminations with fear of death and dying.  She reports slight improvement with her mood since attending partial hospitalization programming patient to transition to intensive out programming on 05/15/2020  Progress in Program Toward Treatment Goals: Ongoing  patient attended and participated with daily group sessions with active and engaged participation.  Patient recently completed partial hospitalization programming and intensive outpatient programming.  Denying suicidal or homicidal ideations.  Denies auditory or visual hallucinations.  Patient reports she has plans to follow-up with weekly therapy sessions and skills-based therapy at discharge.  She denies any medication refills at this time.  Progress (rationale): Keep follow-up appointments with Dr. Tomasa Hose and Birdie Sons, LCSW.  Additional outpatient resources was provided for mental health of [facility] and patient to consider  DBT therapy  Take all medications as prescribed. Keep all follow-up appointments as scheduled.  Do not consume alcohol or use illegal drugs while on prescription medications. Report any adverse effects from your medications to your primary care provider promptly.  In the event of recurrent symptoms or worsening symptoms, call 911, a crisis hotline, or go to the nearest emergency department for evaluation.   [name], NP 06/02/2020

## 2020-06-02 NOTE — Progress Notes (Signed)
Virtual Visit via Video Note  I connected with Alicia Phillips on @TODAY @ at  9:00 AM EDT by a video enabled telemedicine application and verified that I am speaking with the correct person using two identifiers.  Location: Patient: at home Provider: at home   I discussed the limitations of evaluation and management by telemedicine and the availability of in person appointments. The patient expressed understanding and agreed to proceed.  I discussed the assessment and treatment plan with the patient. The patient was provided an opportunity to ask questions and all were answered. The patient agreed with the plan and demonstrated an understanding of the instructions.   The patient was advised to call back or seek an in-person evaluation if the symptoms worsen or if the condition fails to improve as anticipated.  I provided 20 minutes of non-face-to-face time during this encounter.  Patient ID: Alicia Phillips, female   DOB: [birth year], 39 y.o.   MRN: [id number] As per previous [facility] note states: Pt presents as referral from [facility]. Pt states she was encouraged to seek help from her medical provider after having a panic attack in the provider's office. Pt reports history of MH diagnoses of Bipolar 2, borderline personality disorder, and anxiety. Pt reports mental health struggles growing for at least the last year, but it was managable. Pt shares that on 9/7 she had a heart attack and that was the catalyst to her current issues. Pt reports she feels betrayed by her body and has lost some trust in medical providers during the process. Pt states panic feelings and belief that "I'm going to die soon and there's nothing I can do about it." Pt is tearful throughout session. Pt reports comorbid medical diagnoses of Type 1 diabetes and an autoimmune disorder, as well as her current heart problems. Pt reports after her heart attack, tests showed a 95% blockage in an artery and a stint was put in. Pt also reports  historical struggle with diabetic builemia. Pt reports some passive SI and denies intent or plan. Pt denies AVH. Patient's Currently Reported Symptoms/Problems: Pt reports insomnia, panic, excessive worry, hypervigilance, decreased appetite and energy, depressed mood, tearfulness  Pt transitioned from PHP to MH-IOP on 05-15-20.  Pt continues to be labile.  States she is still struggling with being fearful that she will have another heart-attack.  On a scale of 1-10 (10 being the worst) pt rates her depression a #4 and anxiety a #7.  Denied SI/HI or A/V hallucinations. Reports the PHP groups went well and is looking forward to MH-IOP.    Pt completed all scheduled MH-IOP days.  Reports feeling overall stable.  "I worked on 05-17-20 and learned a lot of skills."  States she continues to struggle with a little crying at times.  Continues to deny SI/HI or A/V hallucinations.  On a scale of 1-10 (10 being the worst); pt rates depression #5 and anxiety #4. States that the groups were very helpful. A:  Discharge today.  F/U with Dr. Physicist, medical and Tomasa Hose, LCSW.  Strongly encouraged support groups.  R:  Patient receptive.  Birdie Sons, M.Ed,CNA

## 2020-06-02 NOTE — Progress Notes (Signed)
Virtual Visit via Video Note  I connected with Alicia Phillips on 06/02/20 at  9:00 AM EDT by a video enabled telemedicine application and verified that I am speaking with the correct person using two identifiers.  At orientation to the IOP program, Case Manager discussed the limitations of evaluation and management by telemedicine and the availability of in person appointments. The patient expressed understanding and agreed to proceed with virtual visits throughout the duration of the program.   Location:  Patient: Patient Home Provider: Home Office   History of Present Illness: Bipolar 2 DO  Observations/Objective: Check In: Case Manager checked in with all participants to review discharge dates, insurance authorizations, work-related documents and needs for the treatment team. Counselor facilitated a check-in with group members to assess mood and current functioning. Client reports that she utilized coping skills last night when having difficulties sleeping and the onset of physical sensations. Client reports affirmation of positive improvements to overall functioning. Client presents with mild depression and mild anxiety. Client denied any current SI/HI/psychosis.   Initial Therapeutic Activity: Counselor provided psychoeducation on the purpose and practice of distraction skills using the DBT model. Counselor shared about the TIPP skills, breathing techniques, mindfulness, grounding and other related distraction techniques. Counselor prompted group members to write down 15 skills they could use from an exhaustive list and discussed functionality of list and practical application. Group members shared that they will try paced breathing to combat intense feelings.   Second Therapeutic Activity: Counselor engaged the group in participating in a guided imagery entitled, Finding Your Authentic Self. Guided Imagery included deep breathing, progressive muscle relaxation, imagery, visualizations and  positive affirmations. Client noted that that she enjoyed the guided imagery, how it helped her focus and the gratitude statements.   Check Out: Counselor closed program by allowing time to celebrate the Client as a graduating group member. Counselor shared reflections on progress and allow space for group members to share well wishes and appreciates to the graduating client. Counselor prompted graduating client to share takeaways, reflect on progress and final thoughts for the group. Client discussed a plan to fill time next week when not in treatment. Client reports feeling more equipped to deal with mental health challenges. Client expressed gratitude to program and group members. Client indicated that they are not currently a risk to self of others before signing off.  Assessment and Plan: Clinician recommends that Client step down from IOP to individual therapy, medication management and community support groups. Clinician recommends adherence to crisis/safety plan, taking medications as prescribed, and following up with medical professionals if any issues arise.   Follow Up Instructions: Clinician will send Webex link for next session. The Client was advised to call back or seek an in-person evaluation if the symptoms worsen or if the condition fails to improve as anticipated.     I provided 180 minutes of non-face-to-face time during this encounter.     Hilbert Odor, LCSW

## 2020-06-05 ENCOUNTER — Telehealth: Payer: Self-pay | Admitting: Cardiovascular Disease

## 2020-06-05 ENCOUNTER — Other Ambulatory Visit: Payer: Self-pay

## 2020-06-05 DIAGNOSIS — I25118 Atherosclerotic heart disease of native coronary artery with other forms of angina pectoris: Secondary | ICD-10-CM

## 2020-06-05 MED ORDER — METOPROLOL SUCCINATE ER 25 MG PO TB24: 25.0000 mg | ORAL_TABLET | Freq: Two times a day (BID) | ORAL | 3 refills | Status: DC

## 2020-06-05 NOTE — Telephone Encounter (Signed)
Spoke with Dr. Allyson Sabal via phone, error in chart review, pt should continue taking toprol xl as prescribed, refill sent to pharmacy.  If any additional questions please feel free to reach out to [place]. Thanks!

## 2020-06-05 NOTE — Telephone Encounter (Signed)
Will forward to MD for clarification.

## 2020-06-05 NOTE — Telephone Encounter (Signed)
So just to clarify, pt should take toprol 25mg  twice a day? And take at same time each day, is that correct?

## 2020-06-05 NOTE — Telephone Encounter (Signed)
Does not matter when she takes the Toprol o as long she takes at the same time every day.

## 2020-06-05 NOTE — Telephone Encounter (Signed)
Sending to triage

## 2020-06-05 NOTE — Telephone Encounter (Signed)
     Pt c/o medication issue:  1. Name of Medication: metoprolol succinate (TOPROL-XL) 25 MG 24 hr tablet  2. How are you currently taking this medication (dosage and times per day)? Take 1 tablet (25 mg total) by mouth daily. Take with or immediately following a meal.Patient taking differently: Take 25 mg by mouth in the morning and at bedtime. Take with or immediately following a meal.  3. Are you having a reaction (difficulty breathing--STAT)?   4. What is your medication issue? Pt said Dr. Allyson Sabal changed her dosage to 2 times a day. She would like to get an new prescription send to her pharmacy [id number] - [place], [place] - [address] AT [facility] [address] [facility] [address]

## 2020-06-05 NOTE — Telephone Encounter (Signed)
Alicia Phillips should take the Toprol-XL 25 mg once a day.  It does not matter what time she takes it as long she takes it at the same time every day.  You can just tell her to take it in the morning.

## 2020-06-06 DIAGNOSIS — F4311 Post-traumatic stress disorder, acute: Secondary | ICD-10-CM | POA: Diagnosis not present

## 2020-06-06 DIAGNOSIS — F410 Panic disorder [episodic paroxysmal anxiety] without agoraphobia: Secondary | ICD-10-CM | POA: Diagnosis not present

## 2020-06-06 DIAGNOSIS — F330 Major depressive disorder, recurrent, mild: Secondary | ICD-10-CM | POA: Diagnosis not present

## 2020-06-06 DIAGNOSIS — F4010 Social phobia, unspecified: Secondary | ICD-10-CM | POA: Diagnosis not present

## 2020-06-07 ENCOUNTER — Other Ambulatory Visit: Payer: Self-pay

## 2020-06-07 ENCOUNTER — Encounter: Payer: BC Managed Care – PPO | Attending: Cardiovascular Disease

## 2020-06-07 DIAGNOSIS — Z955 Presence of coronary angioplasty implant and graft: Secondary | ICD-10-CM | POA: Diagnosis not present

## 2020-06-07 DIAGNOSIS — I214 Non-ST elevation (NSTEMI) myocardial infarction: Secondary | ICD-10-CM | POA: Diagnosis not present

## 2020-06-07 NOTE — Progress Notes (Signed)
Daily Session Note  Patient Details  Name: Alicia Phillips MRN: [id number] Date of Birth: 09-12-1980 Referring Provider:    Encounter Date: 06/07/2020  Check In:  Session Check In - 06/07/20 1334      Check-In   Supervising physician immediately available to respond to emergencies See telemetry face sheet for immediately available ER MD    Location [facility]-Cardiac & Pulmonary Rehab    Staff Present Birdie Sons, MPA, Elveria Rising, BA, ACSM CEP, Exercise Physiologist;Kara Eliezer Bottom, MS Exercise Physiologist    Virtual Visit No    Medication changes reported     No    Fall or balance concerns reported    No    Tobacco Cessation Use Decreased    Current number of cigarettes/nicotine per day     0   patient states she hasn't smoked since 11/2   Warm-up and Cool-down Performed on first and last piece of equipment    Resistance Training Performed Yes    VAD Patient? No    PAD/SET Patient? No      Pain Assessment   Currently in Pain? No/denies              Social History   Tobacco Use  Smoking Status Former Smoker  Smokeless Tobacco Never Used  Tobacco Comment   quit after  heartattack Sept 2021    Goals Met:  Independence with exercise equipment Exercise tolerated well No report of cardiac concerns or symptoms Strength training completed today  Goals Unmet:  Not Applicable  Comments: Pt able to follow exercise prescription today without complaint.  Will continue to monitor for progression.    Dr. Jacquel Filbert is Medical Director for [facility].

## 2020-06-07 NOTE — Progress Notes (Signed)
Virtual Visit via Video Note  I connected with Alicia Phillips on 05/25/20 at  8:00 AM EDT by a video enabled telemedicine application and verified that I am speaking with the correct person using two identifiers.  Location: Patient: patient home Provider: clinical home office   I discussed the limitations of evaluation and management by telemedicine and the availability of in person appointments. The patient expressed understanding and agreed to proceed.  I discussed the assessment and treatment plan with the patient. The patient was provided an opportunity to ask questions and all were answered. The patient agreed with the plan and demonstrated an understanding of the instructions.   The patient was advised to call back or seek an in-person evaluation if the symptoms worsen or if the condition fails to improve as anticipated.  Pt was provided 180 minutes of non-face-to-face time during this encounter.   Donia Guiles, LCSW    Daily Group Progress Note  Program: IOP  Group Time: 9:00 - 10:00  Participation Level: Active  Behavioral Response: Appropriate and Sharing  Type of Therapy:  Group Therapy  Summary: Chaplain A. Davee Lomax facilitated grief and loss group.    Therapist Response: Pt engaged in group discussion.       Group Time: 10:00 - 11:00  Participation Level:  Active  Behavioral Response: Appropriate and Sharing  Type of Therapy: Group Therapy  Summary: Clinician led check-in regarding current stressors and situation, and review of patient completed daily inventory. Clinician utilized active listening and empathetic response and validated patient emotions. Clinician facilitated processing group on pertinent issues.   Therapist Response: Patient arrived within time allowed and reports that she is feeling "okay" Patient rates hermood at a7.5on a scale of 1-10 with 10 being great. Pt reports spending time with friends yesterday. Pt reports anxiety  regarding physical symptoms overnight. Pt able to process. Pt engaged in discussion.     Group Time: 11:00 - 12:00  Participation Level:  Active  Behavioral Response: Appropriate and Sharing  Type of Therapy: Group Therapy  Summary: [name] introduced grounding techniques as a coping strategy. [name] utilized handout "Detaching from emotional pain" from [employer]. Group reviewed grounding strategies and how they can apply them to their every day life and in which situations.  Therapist Response: Pt engaged in discussion and is able to identify ways to utilize the techniques.    Donia Guiles, LCSW

## 2020-06-12 ENCOUNTER — Other Ambulatory Visit: Payer: Self-pay

## 2020-06-12 DIAGNOSIS — Z955 Presence of coronary angioplasty implant and graft: Secondary | ICD-10-CM

## 2020-06-12 DIAGNOSIS — I214 Non-ST elevation (NSTEMI) myocardial infarction: Secondary | ICD-10-CM

## 2020-06-12 NOTE — Progress Notes (Signed)
Daily Session Note  Patient Details  Name: Alicia Phillips MRN: [id number] Date of Birth: Feb 23, 1981 Referring Provider:    Encounter Date: 06/12/2020  Check In:  Session Check In - 06/12/20 1326      Check-In   Supervising physician immediately available to respond to emergencies See telemetry face sheet for immediately available ER MD    Location [facility]    Staff Present Birdie Sons, MPA, Mauricia Area, BS, ACSM CEP, Exercise Physiologist;Kara Eliezer Bottom, MS Exercise Physiologist    Virtual Visit No    Medication changes reported     No    Fall or balance concerns reported    No    Tobacco Cessation No Change    Current number of cigarettes/nicotine per day     0    Warm-up and Cool-down Performed on first and last piece of equipment    Resistance Training Performed Yes    VAD Patient? No    PAD/SET Patient? No      Pain Assessment   Currently in Pain? No/denies              Social History   Tobacco Use  Smoking Status Former Smoker  . Types: Cigarettes  Smokeless Tobacco Never Used  Tobacco Comment   patient stated she was smoking but has quit since 05/30/20    Goals Met:  Independence with exercise equipment Exercise tolerated well Personal goals reviewed No report of cardiac concerns or symptoms Strength training completed today  Goals Unmet:  Not Applicable  Comments: Pt able to follow exercise prescription today without complaint.  Will continue to monitor for progression.    Dr. Josalin Filbert is Medical Director for [facility].

## 2020-06-13 DIAGNOSIS — F410 Panic disorder [episodic paroxysmal anxiety] without agoraphobia: Secondary | ICD-10-CM | POA: Diagnosis not present

## 2020-06-13 DIAGNOSIS — F4010 Social phobia, unspecified: Secondary | ICD-10-CM | POA: Diagnosis not present

## 2020-06-13 DIAGNOSIS — F4311 Post-traumatic stress disorder, acute: Secondary | ICD-10-CM | POA: Diagnosis not present

## 2020-06-13 DIAGNOSIS — F39 Unspecified mood [affective] disorder: Secondary | ICD-10-CM | POA: Diagnosis not present

## 2020-06-13 DIAGNOSIS — F330 Major depressive disorder, recurrent, mild: Secondary | ICD-10-CM | POA: Diagnosis not present

## 2020-06-13 DIAGNOSIS — E1059 Type 1 diabetes mellitus with other circulatory complications: Secondary | ICD-10-CM | POA: Diagnosis not present

## 2020-06-20 DIAGNOSIS — F4010 Social phobia, unspecified: Secondary | ICD-10-CM | POA: Diagnosis not present

## 2020-06-20 DIAGNOSIS — F4311 Post-traumatic stress disorder, acute: Secondary | ICD-10-CM | POA: Diagnosis not present

## 2020-06-20 DIAGNOSIS — F410 Panic disorder [episodic paroxysmal anxiety] without agoraphobia: Secondary | ICD-10-CM | POA: Diagnosis not present

## 2020-06-20 DIAGNOSIS — F330 Major depressive disorder, recurrent, mild: Secondary | ICD-10-CM | POA: Diagnosis not present

## 2020-06-25 ENCOUNTER — Other Ambulatory Visit (HOSPITAL_COMMUNITY): Payer: Self-pay | Admitting: Family

## 2020-06-25 DIAGNOSIS — F331 Major depressive disorder, recurrent, moderate: Secondary | ICD-10-CM

## 2020-06-25 DIAGNOSIS — F3181 Bipolar II disorder: Secondary | ICD-10-CM

## 2020-06-27 DIAGNOSIS — F4010 Social phobia, unspecified: Secondary | ICD-10-CM | POA: Diagnosis not present

## 2020-06-27 DIAGNOSIS — F410 Panic disorder [episodic paroxysmal anxiety] without agoraphobia: Secondary | ICD-10-CM | POA: Diagnosis not present

## 2020-06-27 DIAGNOSIS — F4311 Post-traumatic stress disorder, acute: Secondary | ICD-10-CM | POA: Diagnosis not present

## 2020-06-27 DIAGNOSIS — F330 Major depressive disorder, recurrent, mild: Secondary | ICD-10-CM | POA: Diagnosis not present

## 2020-06-28 ENCOUNTER — Telehealth: Payer: Self-pay

## 2020-06-28 ENCOUNTER — Encounter: Payer: BC Managed Care – PPO | Attending: Cardiovascular Disease

## 2020-06-28 ENCOUNTER — Encounter: Payer: Self-pay | Admitting: *Deleted

## 2020-06-28 DIAGNOSIS — Z720 Tobacco use: Secondary | ICD-10-CM | POA: Insufficient documentation

## 2020-06-28 DIAGNOSIS — Z955 Presence of coronary angioplasty implant and graft: Secondary | ICD-10-CM

## 2020-06-28 DIAGNOSIS — I214 Non-ST elevation (NSTEMI) myocardial infarction: Secondary | ICD-10-CM

## 2020-06-28 NOTE — Progress Notes (Signed)
Cardiac Individual Treatment Plan  Patient Details  Name: Alicia Phillips MRN: [id number] Date of Birth: 06/29/1981 Referring Provider:    Initial Encounter Date:    Cardiac Rehab from 05/11/2020 in [facility] Cardiac and Pulmonary Rehab  Date 05/11/20      Visit Diagnosis: NSTEMI (non-ST elevation myocardial infarction) [facility])  Status post coronary artery stent placement  Patient's Home Medications on Admission:  Current Outpatient Medications:  .  ACETAMINOPHEN EXTRA STRENGTH 500 MG tablet, Take 500 mg by mouth every 4 (four) hours as needed for mild pain or headache. , Disp: , Rfl:  .  ARIPiprazole (ABILIFY) 10 MG tablet, Take 1 tablet (10 mg total) by mouth daily., Disp: 30 tablet, Rfl: 2 .  ARIPiprazole (ABILIFY) 5 MG tablet, Take 1 tablet (5 mg total) by mouth daily., Disp: 30 tablet, Rfl: 0 .  aspirin 81 MG chewable tablet, Chew 1 tablet (81 mg total) by mouth daily., Disp: , Rfl:  .  atorvastatin (LIPITOR) 80 MG tablet, Take 1 tablet (80 mg total) by mouth daily., Disp: 90 tablet, Rfl: 3 .  Continuous Blood Gluc Sensor (FREESTYLE LIBRE 14 DAY SENSOR) MISC, Apply topically as directed., Disp: , Rfl:  .  doxycycline (VIBRAMYCIN) 50 MG capsule, Take 1 capsule (50 mg total) by mouth daily., Disp: 30 capsule, Rfl: 0 .  INPEN 100-PINK-NOVO DEVI, 3 (three) times daily., Disp: , Rfl:  .  insulin aspart (NOVOLOG) 100 UNIT/ML injection, Inject 0-20 Units into the skin 3 (three) times daily before meals. Per sliding scale, Disp: , Rfl:  .  lamoTRIgine (LAMICTAL) 100 MG tablet, Take 1 tablet (100 mg total) by mouth at bedtime. Take one tablet at bedtime., Disp: 30 tablet, Rfl: 1 .  metoprolol succinate (TOPROL XL) 25 MG 24 hr tablet, Take 1 tablet (25 mg total) by mouth in the morning and at bedtime., Disp: 180 tablet, Rfl: 3 .  nitroGLYCERIN (NITROSTAT) 0.4 MG SL tablet, Place 1 tablet (0.4 mg total) under the tongue every 5 (five) minutes as needed for chest pain., Disp: 25 tablet, Rfl: 2 .   NOVOFINE PEN NEEDLE 32G X 6 MM MISC, SMARTSIG:Injection 5 Times Daily, Disp: , Rfl:  .  ticagrelor (BRILINTA) 90 MG TABS tablet, Take 1 tablet (90 mg total) by mouth 2 (two) times daily., Disp: 60 tablet, Rfl: 11 .  TRESIBA FLEXTOUCH 100 UNIT/ML FlexTouch Pen, Inject 25 Units into the skin daily. , Disp: , Rfl:   Current Facility-Administered Medications:  .  iron polysaccharides (NIFEREX) capsule 150 mg, 150 mg, Oral, Daily, [facility], [place], [place]  Past Medical History: Past Medical History:  Diagnosis Date  . Anxiety   . Bipolar disorder (HCC)   . CAD (coronary artery disease)    a. LHC 04/05/20: 95% stenosis of mid LAD s/p DES, 30% stenosis of proximal to mid LAD  . Diabetes mellitus type 1 (HCC)   . History of borderline personality disorder   . Hyperlipidemia   . Major depression   . STEMI (ST elevation myocardial infarction) (HCC) 04/05/2020  . Tobacco use     Tobacco Use: Social History   Tobacco Use  Smoking Status Former Smoker  . Types: Cigarettes  Smokeless Tobacco Never Used  Tobacco Comment   patient stated she was smoking but has quit since 05/30/20    Labs: Recent Review Flowsheet Data    Labs for ITP Cardiac and Pulmonary Rehab Latest Ref Rng & Units 04/06/2020   Cholestrol 0 - 200 mg/dL 983   LDLCALC 0 -  99 mg/dL 161(W)   HDL >96 mg/dL 50   Trlycerides <045 mg/dL 96   Hemoglobin W0J 4.8 - 5.6 % 8.6(H)       Exercise Target Goals: Exercise Program Goal: Individual exercise prescription set using results from initial 6 min walk test and THRR while considering  patient's activity barriers and safety.   Exercise Prescription Goal: Initial exercise prescription builds to 30-45 minutes a day of aerobic activity, 2-3 days per week.  Home exercise guidelines will be given to patient during program as part of exercise prescription that the participant will acknowledge.   Education: Aerobic Exercise: - Group verbal and visual presentation on the components of  exercise prescription. Introduces F.I.T.T principle from ACSM for exercise prescriptions.  Reviews F.I.T.T. principles of aerobic exercise including progression. Written material given at graduation.   Cardiac Rehab from 06/07/2020 in [facility] Cardiac and Pulmonary Rehab  Date 05/24/20  [resistance 10/27/201]  Educator [facility]  Instruction Review Code 1- Verbalizes Understanding      Education: Resistance Exercise: - Group verbal and visual presentation on the components of exercise prescription. Introduces F.I.T.T principle from ACSM for exercise prescriptions  Reviews F.I.T.T. principles of resistance exercise including progression. Written material given at graduation.    Education: Exercise & Equipment Safety: - Individual verbal instruction and demonstration of equipment use and safety with use of the equipment.   Cardiac Rehab from 06/07/2020 in [facility] Cardiac and Pulmonary Rehab  Date 05/11/20  Educator [facility]  Instruction Review Code 1- Verbalizes Understanding      Education: Exercise Physiology & General Exercise Guidelines: - Group verbal and written instruction with models to review the exercise physiology of the cardiovascular system and associated critical values. Provides general exercise guidelines with specific guidelines to those with heart or lung disease.    Education: Flexibility, Balance, Mind/Body Relaxation: - Group verbal and visual presentation with interactive activity on the components of exercise prescription. Introduces F.I.T.T principle from ACSM for exercise prescriptions. Reviews F.I.T.T. principles of flexibility and balance exercise training including progression. Also discusses the mind body connection.  Reviews various relaxation techniques to help reduce and manage stress (i.e. Deep breathing, progressive muscle relaxation, and visualization). Balance handout provided to take home. Written material given at graduation.   Activity Barriers & Risk Stratification:   Activity Barriers & Cardiac Risk Stratification - 05/11/20 1654      Activity Barriers & Cardiac Risk Stratification   Activity Barriers None    Cardiac Risk Stratification High           6 Minute Walk:  6 Minute Walk    Row Name 05/11/20 1614         6 Minute Walk   Phase Initial     Distance 1287 feet     Walk Time 6 minutes     # of Rest Breaks 0     MPH 2.43     METS 4.6     RPE 8     Perceived Dyspnea  0     VO2 Peak 16.1     Symptoms No     Resting HR 86 bpm     Resting BP 116/78     Resting Oxygen Saturation  99 %     Exercise Oxygen Saturation  during 6 min walk 98 %     Max Ex. HR 97 bpm     Max Ex. BP 122/74     2 Minute Post BP 118/76  Oxygen Initial Assessment:   Oxygen Re-Evaluation:   Oxygen Discharge (Final Oxygen Re-Evaluation):   Initial Exercise Prescription:  Initial Exercise Prescription - 05/11/20 1600      Date of Initial Exercise RX and Referring Provider   Date 05/11/20      Treadmill   MPH 2.4    Grade 1    Minutes 15    METs 3.17      Recumbant Elliptical   Level 2    RPM 50    Minutes 15    METs 4.6      Elliptical   Level 1    Speed 2.1    Minutes 15    METs 4.6      REL-XR   Level 3    Speed 50    Minutes 15    METs 4.6      Prescription Details   Frequency (times per week) 3    Duration Progress to 30 minutes of continuous aerobic without signs/symptoms of physical distress      Intensity   THRR 40-80% of Max Heartrate 124-162    Ratings of Perceived Exertion 11-13    Perceived Dyspnea 0-4      Progression   Progression Continue to progress workloads to maintain intensity without signs/symptoms of physical distress.      Resistance Training   Training Prescription Yes           Perform Capillary Blood Glucose checks as needed.  Exercise Prescription Changes:  Exercise Prescription Changes    Row Name 05/11/20 1600 05/31/20 1400 06/14/20 1300         Response to Exercise    Blood Pressure (Admit) 116/78 98/52 114/70     Blood Pressure (Exercise) 122/74 134/64 140/82     Blood Pressure (Exit) 118/76 124/64 122/62     Heart Rate (Admit) 86 bpm 97 bpm 85 bpm     Heart Rate (Exercise) 97 bpm 130 bpm 123 bpm     Heart Rate (Exit) 88 bpm 98 bpm 97 bpm     Oxygen Saturation (Admit) 99 % -- --     Oxygen Saturation (Exercise) 98 % -- --     Oxygen Saturation (Exit) 98 % -- --     Rating of Perceived Exertion (Exercise) 8 14 15      Perceived Dyspnea (Exercise) 0 -- --     Symptoms none none none     Comments walk test results -- --     Duration Progress to 30 minutes of  aerobic without signs/symptoms of physical distress Continue with 30 min of aerobic exercise without signs/symptoms of physical distress. Continue with 30 min of aerobic exercise without signs/symptoms of physical distress.     Intensity -- THRR unchanged THRR unchanged       Progression   Progression -- Continue to progress workloads to maintain intensity without signs/symptoms of physical distress. Continue to progress workloads to maintain intensity without signs/symptoms of physical distress.     Average METs -- 3.27 3.7       Resistance Training   Training Prescription -- Yes Yes     Weight 4 lb 4 lb 5 lb     Reps 10-15 10-15 10-15       Interval Training   Interval Training -- No No       Treadmill   MPH -- 3 2.7     Grade -- 1 1     Minutes -- 15 15  METs -- 3.71 3.44       Elliptical   Level -- 1 1     Speed -- 2.1 --     Minutes -- 4 --     METs -- 2 --       REL-XR   Level -- 4 --     Minutes -- 15 --     METs -- 3.6 --            Exercise Comments:   Exercise Goals and Review:  Exercise Goals    Row Name 05/11/20 1659             Exercise Goals   Increase Physical Activity Yes       Intervention Provide advice, education, support and counseling about physical activity/exercise needs.;Develop an individualized exercise prescription for aerobic and  resistive training based on initial evaluation findings, risk stratification, comorbidities and participant's personal goals.       Expected Outcomes Short Term: Attend rehab on a regular basis to increase amount of physical activity.;Long Term: Exercising regularly at least 3-5 days a week.;Long Term: Add in home exercise to make exercise part of routine and to increase amount of physical activity.       Increase Strength and Stamina Yes       Intervention Provide advice, education, support and counseling about physical activity/exercise needs.;Develop an individualized exercise prescription for aerobic and resistive training based on initial evaluation findings, risk stratification, comorbidities and participant's personal goals.       Expected Outcomes Short Term: Increase workloads from initial exercise prescription for resistance, speed, and METs.;Short Term: Perform resistance training exercises routinely during rehab and add in resistance training at home;Long Term: Improve cardiorespiratory fitness, muscular endurance and strength as measured by increased METs and functional capacity ( )       Able to understand and use rate of perceived exertion (RPE) scale Yes       Intervention Provide education and explanation on how to use RPE scale       Expected Outcomes Short Term: Able to use RPE daily in rehab to express subjective intensity level;Long Term:  Able to use RPE to guide intensity level when exercising independently       Able to understand and use Dyspnea scale Yes       Intervention Provide education and explanation on how to use Dyspnea scale       Expected Outcomes Short Term: Able to use Dyspnea scale daily in rehab to express subjective sense of shortness of breath during exertion;Long Term: Able to use Dyspnea scale to guide intensity level when exercising independently       Knowledge and understanding of Target Heart Rate Range (THRR) Yes       Intervention Provide education and  explanation of THRR including how the numbers were predicted and where they are located for reference       Expected Outcomes Short Term: Able to state/look up THRR;Long Term: Able to use THRR to govern intensity when exercising independently;Short Term: Able to use daily as guideline for intensity in rehab       Able to check pulse independently Yes       Intervention Provide education and demonstration on how to check pulse in carotid and radial arteries.;Review the importance of being able to check your own pulse for safety during independent exercise       Expected Outcomes Short Term: Able to explain why pulse checking is important during independent exercise;Long  Term: Able to check pulse independently and accurately       Understanding of Exercise Prescription Yes       Intervention Provide education, explanation, and written materials on patient's individual exercise prescription       Expected Outcomes Short Term: Able to explain program exercise prescription;Long Term: Able to explain home exercise prescription to exercise independently              Exercise Goals Re-Evaluation :  Exercise Goals Re-Evaluation    Row Name 05/17/20 1658 05/31/20 1429 06/12/20 1333 06/14/20 1318       Exercise Goal Re-Evaluation   Exercise Goals Review Increase Physical Activity;Able to understand and use rate of perceived exertion (RPE) scale;Knowledge and understanding of Target Heart Rate Range (THRR);Understanding of Exercise Prescription;Increase Strength and Stamina;Able to check pulse independently Increase Physical Activity;Increase Strength and Stamina;Understanding of Exercise Prescription Increase Physical Activity;Increase Strength and Stamina;Understanding of Exercise Prescription Increase Physical Activity;Increase Strength and Stamina;Understanding of Exercise Prescription    Comments Reviewed RPE and dyspnea scales, THR and program prescription with pt today.  Pt voiced understanding and  was given a copy of goals to take home. Christyna  is doing well in rehab.  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  She is up to 2 minutes on there at a time.  We will continue to monitor her progress. Emileigh is doing well in rehab.  She is doing yoga on of her off days(5x/wk). She is also walking daily for about 20 min.  We talked about adding in more time to her walks at home. Menna is doing well in rehab.  She is doing yoga on of her off days(5x/wk). She is also walking daily for about 20 min.  We talked about adding in more time to her walks at home.    Expected Outcomes Short: Use RPE daily to regulate intensity. Long: Follow program prescription in THR. Short: Continue to improve on elliptical Long: Continue to increase stamina. Short: Continue to add time to walks at home Long; Continue to exercise on off days. Short: Continue to add time to walks at home Long; Continue to exercise on off days.           Discharge Exercise Prescription (Final Exercise Prescription Changes):  Exercise Prescription Changes - 06/14/20 1300      Response to Exercise   Blood Pressure (Admit) 114/70    Blood Pressure (Exercise) 140/82    Blood Pressure (Exit) 122/62    Heart Rate (Admit) 85 bpm    Heart Rate (Exercise) 123 bpm    Heart Rate (Exit) 97 bpm    Rating of Perceived Exertion (Exercise) 15    Symptoms none    Duration Continue with 30 min of aerobic exercise without signs/symptoms of physical distress.    Intensity THRR unchanged      Progression   Progression Continue to progress workloads to maintain intensity without signs/symptoms of physical distress.    Average METs 3.7      Resistance Training   Training Prescription Yes    Weight 5 lb    Reps 10-15      Interval Training   Interval Training No      Treadmill   MPH 2.7    Grade 1    Minutes 15    METs 3.44      Elliptical   Level 1           Nutrition:  Target Goals: Understanding of nutrition guidelines,  daily  intake of sodium 1500mg , cholesterol 200mg , calories 30% from fat and 7% or less from saturated fats, daily to have 5 or more servings of fruits and vegetables.  Education: All About Nutrition: -Group instruction provided by verbal, written material, interactive activities, discussions, models, and posters to present general guidelines for heart healthy nutrition including fat, fiber, MyPlate, the role of sodium in heart healthy nutrition, utilization of the nutrition label, and utilization of this knowledge for meal planning. Follow up email sent as well. Written material given at graduation.   [facility]  Date 06/07/20  [facility]  Instruction Review Code 1- Verbalizes Understanding      Biometrics:  Pre Biometrics - 05/11/20 1613      Pre Biometrics   Height 5' 7.5" (1.715 m)    Weight 165 lb (74.8 kg)    BMI (Calculated) 25.45    Single Leg Stand 30 seconds            Nutrition Therapy Plan and Nutrition Goals:   Nutrition Assessments:  Nutrition Assessments - 05/11/20 1604      MEDFICTS Scores   Pre Score 32          MEDIFICTS Score Key:  ?70 Need to make dietary changes   40-70 Heart Healthy Diet  ? 40 Therapeutic Level Cholesterol Diet   Picture Your Plate Scores:  <40<40 Unhealthy dietary pattern with much room for improvement.  41-50 Dietary pattern unlikely to meet recommendations for good health and room for improvement.  51-60 More healthful dietary pattern, with some room for improvement.   >60 Healthy dietary pattern, although there may be some specific behaviors that could be improved.    Nutrition Goals Re-Evaluation:  Nutrition Goals Re-Evaluation    Row Name 06/12/20 1339             Goals   Nutrition Goal Schedule to meet with Melissa and get questions answered       Comment Pt wants to meet with dietitian to get questions answered       Expected Outcome Meet with dietitian               Nutrition Goals Discharge (Final Nutrition Goals Re-Evaluation):  Nutrition Goals Re-Evaluation - 06/12/20 1339      Goals   Nutrition Goal Schedule to meet with Melissa and get questions answered    Comment Pt wants to meet with dietitian to get questions answered    Expected Outcome Meet with dietitian           Psychosocial: Target Goals: Acknowledge presence or absence of significant depression and/or stress, maximize coping skills, provide positive support system. Participant is able to verbalize types and ability to use techniques and skills needed for reducing stress and depression.   Education: Stress, Anxiety, and Depression - Group verbal and visual presentation to define topics covered.  Reviews how body is impacted by stress, anxiety, and depression.  Also discusses healthy ways to reduce stress and to treat/manage anxiety and depression.  Written material given at graduation.   Education: Sleep Hygiene -Provides group verbal and written instruction about how sleep can affect your health.  Define sleep hygiene, discuss sleep cycles and impact of sleep habits. Review good sleep hygiene tips.    Initial Review & Psychosocial Screening:  Initial Psych Review & Screening - 05/05/20 0944      Initial Review   Current issues with Current Anxiety/Panic;Current Stress Concerns;Current Sleep  Concerns;History of Depression;Current Psychotropic Meds    Source of Stress Concerns --    Comments Post MI, history of anxiety/depression      Family Dynamics   Good Support System? Yes      Barriers   Psychosocial barriers to participate in program There are no identifiable barriers or psychosocial needs.;The patient should benefit from training in stress management and relaxation.      Screening Interventions   Interventions Encouraged to exercise;Provide feedback about the scores to participant;To provide support and resources with identified psychosocial needs     Expected Outcomes Short Term goal: Utilizing psychosocial counselor, staff and physician to assist with identification of specific Stressors or current issues interfering with healing process. Setting desired goal for each stressor or current issue identified.;Long Term Goal: Stressors or current issues are controlled or eliminated.;Short Term goal: Identification and review with participant of any Quality of Life or Depression concerns found by scoring the questionnaire.;Long Term goal: The participant improves quality of Life and PHQ9 Scores as seen by post scores and/or verbalization of changes           Quality of Life Scores:   Quality of Life - 05/11/20 1603      Quality of Life   Select Quality of Life      Quality of Life Scores   Health/Function Pre 12.53 %    Socioeconomic Pre 20.5 %    Psych/Spiritual Pre 15 %    Family Pre 23 %    GLOBAL Pre 15.8 %          Scores of 19 and below usually indicate a poorer quality of life in these areas.  A difference of  2-3 points is a clinically meaningful difference.  A difference of 2-3 points in the total score of the Quality of Life Index has been associated with significant improvement in overall quality of life, self-image, physical symptoms, and general health in studies assessing change in quality of life.  PHQ-9: Recent Review Flowsheet Data    Depression screen [facility] 2/9 05/11/2020   Decreased Interest 1   Down, Depressed, Hopeless 2   PHQ - 2 Score 3   Altered sleeping 2   Tired, decreased energy 1   Change in appetite 1   Feeling bad or failure about yourself  0   Trouble concentrating 0   Moving slowly or fidgety/restless 0   Suicidal thoughts 0   PHQ-9 Score 7   Difficult doing work/chores Somewhat difficult     Interpretation of Total Score  Total Score Depression Severity:  1-4 = Minimal depression, 5-9 = Mild depression, 10-14 = Moderate depression, 15-19 = Moderately severe depression, 20-27 = Severe depression    Psychosocial Evaluation and Intervention:  Psychosocial Evaluation - 05/05/20 1004      Psychosocial Evaluation & Interventions   Comments Tytiana is working very hard post MI to get back into a good mental state. She had her MI while trying to relax, so she is currently taking anxiety meds to help sleep. She is seeing a therapist for anxiety, attending relaxation classes, and has been given an extension from her work to be out til December. She is really looking forward to coming to Cardiac Rehab so she can learn more about her health. She quit smoking after her heart attack is very motivated to stay away from smoking. Her treatment for diabetes has had to go through recent changes in response to her fluctuating CBG readings.    Expected Outcomes  Short: attend cardiac rehab for education and exercise. Long: maintain positive self care habits.    Continue Psychosocial Services  Follow up required by staff           Psychosocial Re-Evaluation:  Psychosocial Re-Evaluation    Row Name 06/12/20 1336             Psychosocial Re-Evaluation   Current issues with Current Anxiety/Panic;Current Stress Concerns       Comments Faithlyn is doing well in rehab.  She has "graduated" from her group therapy class and now see a therapist twice a week. She is really liking her and doing well overall.  She is sleeping well and sleeps til her alarm goes off.       Expected Outcomes SHort: Continue with new therapist  Long: Continue to manage anxiety.       Interventions Encouraged to attend Cardiac Rehabilitation for the exercise       Continue Psychosocial Services  Follow up required by staff              Psychosocial Discharge (Final Psychosocial Re-Evaluation):  Psychosocial Re-Evaluation - 06/12/20 1336      Psychosocial Re-Evaluation   Current issues with Current Anxiety/Panic;Current Stress Concerns    Comments Arlesia is doing well in rehab.  She has "graduated" from her group therapy class and  now see a therapist twice a week. She is really liking her and doing well overall.  She is sleeping well and sleeps til her alarm goes off.    Expected Outcomes SHort: Continue with new therapist  Long: Continue to manage anxiety.    Interventions Encouraged to attend Cardiac Rehabilitation for the exercise    Continue Psychosocial Services  Follow up required by staff           Vocational Rehabilitation: Provide vocational rehab assistance to qualifying candidates.   Vocational Rehab Evaluation & Intervention:  Vocational Rehab - 05/05/20 0942      Initial Vocational Rehab Evaluation & Intervention   Assessment shows need for Vocational Rehabilitation No           Education: Education Goals: Education classes will be provided on a variety of topics geared toward better understanding of heart health and risk factor modification. Participant will state understanding/return demonstration of topics presented as noted by education test scores.  Learning Barriers/Preferences:  Learning Barriers/Preferences - 05/05/20 0942      Learning Barriers/Preferences   Learning Barriers None    Learning Preferences None           General Cardiac Education Topics:  AED/CPR: - Group verbal and written instruction with the use of models to demonstrate the basic use of the AED with the basic ABC's of resuscitation.   Anatomy and Cardiac Procedures: - Group verbal and visual presentation and models provide information about basic cardiac anatomy and function. Reviews the testing methods done to diagnose heart disease and the outcomes of the test results. Describes the treatment choices: Medical Management, Angioplasty, or Coronary Bypass Surgery for treating various heart conditions including Myocardial Infarction, Angina, Valve Disease, and Cardiac Arrhythmias.  Written material given at graduation.   Cardiac Rehab from 06/07/2020 in [facility] Cardiac and Pulmonary Rehab  Date 05/24/20  Educator  [facility]  Instruction Review Code 1- Verbalizes Understanding      Medication Safety: - Group verbal and visual instruction to review commonly prescribed medications for heart and lung disease. Reviews the medication, class of the drug, and side effects. Includes the steps to  properly store meds and maintain the prescription regimen.  Written material given at graduation.   Intimacy: - Group verbal instruction through game format to discuss how heart and lung disease can affect sexual intimacy. Written material given at graduation..   Cardiac Rehab from 06/07/2020 in [facility] Cardiac and Pulmonary Rehab  Date 05/17/20  Educator AS  Instruction Review Code 1- Verbalizes Understanding      Know Your Numbers and Heart Failure: - Group verbal and visual instruction to discuss disease risk factors for cardiac and pulmonary disease and treatment options.  Reviews associated critical values for Overweight/Obesity, Hypertension, Cholesterol, and Diabetes.  Discusses basics of heart failure: signs/symptoms and treatments.  Introduces Heart Failure Zone chart for action plan for heart failure.  Written material given at graduation.   Infection Prevention: - Provides verbal and written material to individual with discussion of infection control including proper hand washing and proper equipment cleaning during exercise session.   Cardiac Rehab from 06/07/2020 in [facility] Cardiac and Pulmonary Rehab  Date 05/11/20  Educator KL  Instruction Review Code 1- Verbalizes Understanding      Falls Prevention: - Provides verbal and written material to individual with discussion of falls prevention and safety.   Cardiac Rehab from 06/07/2020 in [facility] Cardiac and Pulmonary Rehab  Date 05/11/20  Educator KL  Instruction Review Code 1- Verbalizes Understanding      Other: -Provides group and verbal instruction on various topics (see comments)   Knowledge Questionnaire Score:  Knowledge Questionnaire Score -  05/11/20 1702      Knowledge Questionnaire Score   Pre Score 26/26           Core Components/Risk Factors/Patient Goals at Admission:  Personal Goals and Risk Factors at Admission - 05/11/20 1700      Core Components/Risk Factors/Patient Goals on Admission    Weight Management Yes;Weight Maintenance    Intervention Weight Management/Obesity: Establish reasonable short term and long term weight goals.;Weight Management: Provide education and appropriate resources to help participant work on and attain dietary goals.;Weight Management: Develop a combined nutrition and exercise program designed to reach desired caloric intake, while maintaining appropriate intake of nutrient and fiber, sodium and fats, and appropriate energy expenditure required for the weight goal.    Admit Weight 165 lb (74.8 kg)    Goal Weight: Short Term 165 lb (74.8 kg)    Goal Weight: Long Term 165 lb (74.8 kg)    Expected Outcomes Short Term: Continue to assess and modify interventions until short term weight is achieved;Weight Maintenance: Understanding of the daily nutrition guidelines, which includes 25-35% calories from fat, 7% or less cal from saturated fats, less than  cholesterol, less than 1.5gm of sodium, & 5 or more servings of fruits and vegetables daily;Understanding recommendations for meals to include 15-35% energy as protein, 25-35% energy from fat, 35-60% energy from carbohydrates, less than  of dietary cholesterol, 20-35 gm of total fiber daily;Understanding of distribution of calorie intake throughout the day with the consumption of 4-5 meals/snacks    Tobacco Cessation Yes    Intervention Offer self-teaching materials, assist with locating and accessing local/national Quit Smoking programs, and support quit date choice.;Assist the participant in steps to quit. Provide individualized education and counseling about committing to Tobacco Cessation, relapse prevention, and pharmacological support that  can be provided by physician.    Expected Outcomes Short Term: Will demonstrate readiness to quit, by selecting a quit date.;Long Term: Complete abstinence from all tobacco products for at least 12 months  from quit date.;Short Term: Will quit all tobacco product use, adhering to prevention of relapse plan.    Diabetes Yes    Intervention Provide education about signs/symptoms and action to take for hypo/hyperglycemia.;Provide education about proper nutrition, including hydration, and aerobic/resistive exercise prescription along with prescribed medications to achieve blood glucose in normal ranges: Fasting glucose 65-99 mg/dL    Expected Outcomes Short Term: Participant verbalizes understanding of the signs/symptoms and immediate care of hyper/hypoglycemia, proper foot care and importance of medication, aerobic/resistive exercise and nutrition plan for blood glucose control.;Long Term: Attainment of HbA1C < 7%.    Hypertension Yes    Intervention Provide education on lifestyle modifcations including regular physical activity/exercise, weight management, moderate sodium restriction and increased consumption of fresh fruit, vegetables, and low fat dairy, alcohol moderation, and smoking cessation.;Monitor prescription use compliance.    Expected Outcomes Short Term: Continued assessment and intervention until BP is < 140/25mm HG in hypertensive participants. < 130/93mm HG in hypertensive participants with diabetes, heart failure or chronic kidney disease.;Long Term: Maintenance of blood pressure at goal levels.    Lipids Yes    Intervention Provide education and support for participant on nutrition & aerobic/resistive exercise along with prescribed medications to achieve LDL 70mg , HDL >40mg .    Expected Outcomes Short Term: Participant states understanding of desired cholesterol values and is compliant with medications prescribed. Participant is following exercise prescription and nutrition guidelines.;Long  Term: Cholesterol controlled with medications as prescribed, with individualized exercise RX and with personalized nutrition plan. Value goals: LDL < 70mg , HDL > 40 mg.           Education:Diabetes - Individual verbal and written instruction to review signs/symptoms of diabetes, desired ranges of glucose level fasting, after meals and with exercise. Acknowledge that pre and post exercise glucose checks will be done for 3 sessions at entry of program.   Cardiac Rehab from 05/05/2020 in [facility] Cardiac and Pulmonary Rehab  Date 05/05/20  Educator [facility]  Instruction Review Code 1- Verbalizes Understanding      Core Components/Risk Factors/Patient Goals Review:   Goals and Risk Factor Review    Row Name 06/12/20 1339             Core Components/Risk Factors/Patient Goals Review   Personal Goals Review Weight Management/Obesity;Tobacco Cessation;Diabetes;Hypertension;Lipids       Review Paytan is doing well in rehab.  Her weight has been stable but her mood medication she is currently on causes her to get hangry, gain weight, and increase lipids.  She is going to pyschiatrist on Tuesday and plans to talk with them about chaning medicaiton to something more heart friendly. She is doing well off her cigarettes.  She has been switching things out for cigarette and finding that she can switch these out and trick her brain.  She is doing well with her blood pressures and she does check them at home.  She does not that if she sees higher numbers it can set off her anxiety.  So we talked about how auto cuffs can tend to read higher and not to let it get to her too much.  Ahmari's sugars have also been running higher and she is also hoping this may be related to her meds too.       Expected Outcomes Short: Talk to doctor about meds Long: Continue to manage risk factors.              Core Components/Risk Factors/Patient Goals at Discharge (Final Review):   Goals  and Risk Factor Review - 06/12/20 1339       Core Components/Risk Factors/Patient Goals Review   Personal Goals Review Weight Management/Obesity;Tobacco Cessation;Diabetes;Hypertension;Lipids    Review Darika is doing well in rehab.  Her weight has been stable but her mood medication she is currently on causes her to get hangry, gain weight, and increase lipids.  She is going to pyschiatrist on Tuesday and plans to talk with them about chaning medicaiton to something more heart friendly. She is doing well off her cigarettes.  She has been switching things out for cigarette and finding that she can switch these out and trick her brain.  She is doing well with her blood pressures and she does check them at home.  She does not that if she sees higher numbers it can set off her anxiety.  So we talked about how auto cuffs can tend to read higher and not to let it get to her too much.  Hatsuko's sugars have also been running higher and she is also hoping this may be related to her meds too.    Expected Outcomes Short: Talk to doctor about meds Long: Continue to manage risk factors.           ITP Comments:  ITP Comments    Row Name 05/05/20 0936 05/11/20 1645 05/11/20 1646 05/17/20 1654 05/31/20 0610   ITP Comments Initial telephone orientation completed. Diagnosis can be found in [facility] 9/8.EP orientation scheduled for 10/14 at 2pm Completed and gym orientation. Initial ITP created and sent for review to Dr. Bethann Punches, Medical Director. Kiernan is a current tobacco user. Intervention for tobacco cessation was provided at the initial medical review. She was asked about readiness to quit. Bradlee stated she had quit before but started up again. She is up to 5 cigarettes/day and is ready to quit again. We provided her with a fake cigarette to help cope with the oral fixation. Patient was advised and educated about tobacco cessation using combination therapy, tobacco cessation classes, quit line, and quit smoking apps. Patient demonstrated understanding of  this material. Staff will continue to provide encouragement and follow up with the patient throughout the program. First full day of exercise!  Patient was oriented to gym and equipment including functions, settings, policies, and procedures.  Patient's individual exercise prescription and treatment plan were reviewed.  All starting workloads were established based on the results of the 6 minute walk test done at initial orientation visit.  The plan for exercise progression was also introduced and progression will be customized based on patient's performance and goals. 30 Day review completed. Medical Director ITP review done, changes made as directed, and signed approval by Medical Director.   Row Name 06/01/20 1344 06/28/20 0910         ITP Comments Patient called and reported she had a fever and is getting COVID tested. Will cancel [facility] appointments until we hear on results. Patient agreed. 30 Day review completed. Medical Director ITP review done, changes made as directed, and signed approval by Medical Director.  HAs not attended since 11/15             Comments:

## 2020-06-28 NOTE — Telephone Encounter (Signed)
Called to check in on Alicia Phillips; she had a loss in her family last week and was out. She was scheduled to come back Monday. LM.

## 2020-07-04 DIAGNOSIS — F410 Panic disorder [episodic paroxysmal anxiety] without agoraphobia: Secondary | ICD-10-CM | POA: Diagnosis not present

## 2020-07-04 DIAGNOSIS — F4010 Social phobia, unspecified: Secondary | ICD-10-CM | POA: Diagnosis not present

## 2020-07-04 DIAGNOSIS — F330 Major depressive disorder, recurrent, mild: Secondary | ICD-10-CM | POA: Diagnosis not present

## 2020-07-04 DIAGNOSIS — F4311 Post-traumatic stress disorder, acute: Secondary | ICD-10-CM | POA: Diagnosis not present

## 2020-07-05 ENCOUNTER — Telehealth: Payer: Self-pay | Admitting: *Deleted

## 2020-07-05 ENCOUNTER — Encounter: Payer: Self-pay | Admitting: *Deleted

## 2020-07-05 DIAGNOSIS — I214 Non-ST elevation (NSTEMI) myocardial infarction: Secondary | ICD-10-CM

## 2020-07-05 DIAGNOSIS — Z955 Presence of coronary angioplasty implant and graft: Secondary | ICD-10-CM

## 2020-07-05 NOTE — Telephone Encounter (Signed)
Called pt to check in.  Has not been here since grandmother passed.  Left message.

## 2020-07-11 DIAGNOSIS — F4311 Post-traumatic stress disorder, acute: Secondary | ICD-10-CM | POA: Diagnosis not present

## 2020-07-11 DIAGNOSIS — F330 Major depressive disorder, recurrent, mild: Secondary | ICD-10-CM | POA: Diagnosis not present

## 2020-07-11 DIAGNOSIS — F4010 Social phobia, unspecified: Secondary | ICD-10-CM | POA: Diagnosis not present

## 2020-07-11 DIAGNOSIS — F410 Panic disorder [episodic paroxysmal anxiety] without agoraphobia: Secondary | ICD-10-CM | POA: Diagnosis not present

## 2020-07-12 ENCOUNTER — Telehealth: Payer: Self-pay

## 2020-07-12 NOTE — Telephone Encounter (Signed)
Called to check in on Alicia Phillips as she hasn't been here since 06/12/20. We have not heard from her since her grandmas passing the week of Thanksgiving. Unfortunately, we will have to discharge her in 2 weeks if we do not hear back from her. Sent letter just in case she is not receiving our calls.

## 2020-07-13 DIAGNOSIS — Z720 Tobacco use: Secondary | ICD-10-CM | POA: Insufficient documentation

## 2020-07-14 DIAGNOSIS — F39 Unspecified mood [affective] disorder: Secondary | ICD-10-CM | POA: Diagnosis not present

## 2020-07-14 DIAGNOSIS — E1059 Type 1 diabetes mellitus with other circulatory complications: Secondary | ICD-10-CM | POA: Diagnosis not present

## 2020-07-14 DIAGNOSIS — F410 Panic disorder [episodic paroxysmal anxiety] without agoraphobia: Secondary | ICD-10-CM | POA: Diagnosis not present

## 2020-07-18 DIAGNOSIS — F4311 Post-traumatic stress disorder, acute: Secondary | ICD-10-CM | POA: Diagnosis not present

## 2020-07-18 DIAGNOSIS — F410 Panic disorder [episodic paroxysmal anxiety] without agoraphobia: Secondary | ICD-10-CM | POA: Diagnosis not present

## 2020-07-18 DIAGNOSIS — F330 Major depressive disorder, recurrent, mild: Secondary | ICD-10-CM | POA: Diagnosis not present

## 2020-07-18 DIAGNOSIS — F4010 Social phobia, unspecified: Secondary | ICD-10-CM | POA: Diagnosis not present

## 2020-07-26 ENCOUNTER — Encounter: Payer: Self-pay | Admitting: *Deleted

## 2020-07-26 DIAGNOSIS — I214 Non-ST elevation (NSTEMI) myocardial infarction: Secondary | ICD-10-CM

## 2020-07-26 DIAGNOSIS — Z955 Presence of coronary angioplasty implant and graft: Secondary | ICD-10-CM

## 2020-07-26 NOTE — Progress Notes (Signed)
Cardiac Individual Treatment Plan  Patient Details  Name: Alicia Phillips MRN: [id number] Date of Birth: [birth year] Referring Provider:    Initial Encounter Date:  Flowsheet Row Cardiac Rehab from 05/11/2020 in [facility] Cardiac and Pulmonary Rehab  Date 05/11/20      Visit Diagnosis: NSTEMI (non-ST elevation myocardial infarction) [facility])  Status post coronary artery stent placement  Patient's Home Medications on Admission:  Current Outpatient Medications:  .  ACETAMINOPHEN EXTRA STRENGTH 500 MG tablet, Take 500 mg by mouth every 4 (four) hours as needed for mild pain or headache. , Disp: , Rfl:  .  ARIPiprazole (ABILIFY) 10 MG tablet, Take 1 tablet (10 mg total) by mouth daily., Disp: 30 tablet, Rfl: 2 .  ARIPiprazole (ABILIFY) 5 MG tablet, Take 1 tablet (5 mg total) by mouth daily., Disp: 30 tablet, Rfl: 0 .  aspirin 81 MG chewable tablet, Chew 1 tablet (81 mg total) by mouth daily., Disp: , Rfl:  .  atorvastatin (LIPITOR) 80 MG tablet, Take 1 tablet (80 mg total) by mouth daily., Disp: 90 tablet, Rfl: 3 .  Continuous Blood Gluc Sensor (FREESTYLE LIBRE 14 DAY SENSOR) MISC, Apply topically as directed., Disp: , Rfl:  .  doxycycline (VIBRAMYCIN) 50 MG capsule, Take 1 capsule (50 mg total) by mouth daily., Disp: 30 capsule, Rfl: 0 .  INPEN 100-PINK-NOVO DEVI, 3 (three) times daily., Disp: , Rfl:  .  insulin aspart (NOVOLOG) 100 UNIT/ML injection, Inject 0-20 Units into the skin 3 (three) times daily before meals. Per sliding scale, Disp: , Rfl:  .  lamoTRIgine (LAMICTAL) 100 MG tablet, Take 1 tablet (100 mg total) by mouth at bedtime. Take one tablet at bedtime., Disp: 30 tablet, Rfl: 1 .  metoprolol succinate (TOPROL XL) 25 MG 24 hr tablet, Take 1 tablet (25 mg total) by mouth in the morning and at bedtime., Disp: 180 tablet, Rfl: 3 .  nitroGLYCERIN (NITROSTAT) 0.4 MG SL tablet, Place 1 tablet (0.4 mg total) under the tongue every 5 (five) minutes as needed for chest pain., Disp: 25 tablet,  Rfl: 2 .  NOVOFINE PEN NEEDLE 32G X 6 MM MISC, SMARTSIG:Injection 5 Times Daily, Disp: , Rfl:  .  ticagrelor (BRILINTA) 90 MG TABS tablet, Take 1 tablet (90 mg total) by mouth 2 (two) times daily., Disp: 60 tablet, Rfl: 11 .  TRESIBA FLEXTOUCH 100 UNIT/ML FlexTouch Pen, Inject 25 Units into the skin daily. , Disp: , Rfl:   Current Facility-Administered Medications:  .  iron polysaccharides (NIFEREX) capsule 150 mg, 150 mg, Oral, Daily, [facility], [place], [place]  Past Medical History: Past Medical History:  Diagnosis Date  . Anxiety   . Bipolar disorder (HCC)   . CAD (coronary artery disease)    a. LHC 04/05/20: 95% stenosis of mid LAD s/p DES, 30% stenosis of proximal to mid LAD  . Diabetes mellitus type 1 (HCC)   . History of borderline personality disorder   . Hyperlipidemia   . Major depression   . STEMI (ST elevation myocardial infarction) (HCC) 04/05/2020  . Tobacco use     Tobacco Use: Social History   Tobacco Use  Smoking Status Former Smoker  . Types: Cigarettes  Smokeless Tobacco Never Used  Tobacco Comment   patient stated she was smoking but has quit since 05/30/20    Labs: Recent Review Flowsheet Data    Labs for ITP Cardiac and Pulmonary Rehab Latest Ref Rng & Units 04/06/2020   Cholestrol 0 - 200 mg/dL 588   LDLCALC 0 -  99 mg/dL 161(W124(H)   HDL >96>40 mg/dL 50   Trlycerides <045<150 mg/dL 96   Hemoglobin [id number] 4.8 - 5.6 % 8.6(H)       Exercise Target Goals: Exercise Program Goal: Individual exercise prescription set using results from initial 6 min walk test and THRR while considering  patient's activity barriers and safety.   Exercise Prescription Goal: Initial exercise prescription builds to 30-45 minutes a day of aerobic activity, 2-3 days per week.  Home exercise guidelines will be given to patient during program as part of exercise prescription that the participant will acknowledge.   Education: Aerobic Exercise: - Group verbal and visual presentation on the  components of exercise prescription. Introduces F.I.T.T principle from ACSM for exercise prescriptions.  Reviews F.I.T.T. principles of aerobic exercise including progression. Written material given at graduation. Flowsheet Row Cardiac [facility]  Date 05/24/20  [resistance 10/27/201]  Educator [facility]  Instruction Review Code 1- [employer]ibbVerbalizes Understanding      Education: Resistance Exercise: - Group verbal and visual presentation on the components of exercise prescription. Introduces F.I.T.T principle from ACSM for exercise prescriptions  Reviews F.I.T.T. principles of resistance exercise including progression. Written material given at graduation.    Education: Exercise & Equipment Safety: - Individual verbal instruction and demonstration of equipment use and safety with use of the equipment. Flowsheet Row [facility]  Date 05/11/20  Educator [place]  Instruction Review Code 1- Verbalizes Understanding      Education: Exercise Physiology & General Exercise Guidelines: - Group verbal and written instruction with models to review the exercise physiology of the cardiovascular system and associated critical values. Provides general exercise guidelines with specific guidelines to those with heart or lung disease.    Education: Flexibility, Balance, Mind/Body Relaxation: - Group verbal and visual presentation with interactive activity on the components of exercise prescription. Introduces F.I.T.T principle from ACSM for exercise prescriptions. Reviews F.I.T.T. principles of flexibility and balance exercise training including progression. Also discusses the mind body connection.  Reviews various relaxation techniques to help reduce and manage stress (i.e. Deep breathing, progressive muscle relaxation, and visualization). Balance handout provided to take home. Written material given at  graduation.   Activity Barriers & Risk Stratification:  Activity Barriers & Cardiac Risk Stratification - 05/11/20 1654      Activity Barriers & Cardiac Risk Stratification   Activity Barriers None    Cardiac Risk Stratification High           6 Minute Walk:  6 Minute Walk    Row Name 05/11/20 1614         6 Minute Walk   Phase Initial     Distance 1287 feet     Walk Time 6 minutes     # of Rest Breaks 0     MPH 2.43     METS 4.6     RPE 8     Perceived Dyspnea  0     VO2 Peak 16.1     Symptoms No     Resting HR 86 bpm     Resting BP 116/78     Resting Oxygen Saturation  99 %     Exercise Oxygen Saturation  during 6 min walk 98 %     Max Ex. HR 97 bpm     Max Ex. BP 122/74     2 Minute Post BP 118/76  Oxygen Initial Assessment:   Oxygen Re-Evaluation:   Oxygen Discharge (Final Oxygen Re-Evaluation):   Initial Exercise Prescription:  Initial Exercise Prescription - 05/11/20 1600      Date of Initial Exercise RX and Referring Provider   Date 05/11/20      Treadmill   MPH 2.4    Grade 1    Minutes 15    METs 3.17      Recumbant Elliptical   Level 2    RPM 50    Minutes 15    METs 4.6      Elliptical   Level 1    Speed 2.1    Minutes 15    METs 4.6      REL-XR   Level 3    Speed 50    Minutes 15    METs 4.6      Prescription Details   Frequency (times per week) 3    Duration Progress to 30 minutes of continuous aerobic without signs/symptoms of physical distress      Intensity   THRR 40-80% of Max Heartrate 124-162    Ratings of Perceived Exertion 11-13    Perceived Dyspnea 0-4      Progression   Progression Continue to progress workloads to maintain intensity without signs/symptoms of physical distress.      Resistance Training   Training Prescription Yes           Perform Capillary Blood Glucose checks as needed.  Exercise Prescription Changes:  Exercise Prescription Changes    Row Name 05/11/20 1600  05/31/20 1400 06/14/20 1300         Response to Exercise   Blood Pressure (Admit) 116/78 98/52 114/70     Blood Pressure (Exercise) 122/74 134/64 140/82     Blood Pressure (Exit) 118/76 124/64 122/62     Heart Rate (Admit) 86 bpm 97 bpm 85 bpm     Heart Rate (Exercise) 97 bpm 130 bpm 123 bpm     Heart Rate (Exit) 88 bpm 98 bpm 97 bpm     Oxygen Saturation (Admit) 99 % -- --     Oxygen Saturation (Exercise) 98 % -- --     Oxygen Saturation (Exit) 98 % -- --     Rating of Perceived Exertion (Exercise) Perceived Dyspnea (Exercise) 0 -- --     Symptoms none none none     Comments walk test results -- --     Duration Progress to 30 minutes of  aerobic without signs/symptoms of physical distress Continue with 30 min of aerobic exercise without signs/symptoms of physical distress. Continue with 30 min of aerobic exercise without signs/symptoms of physical distress.     Intensity -- THRR unchanged THRR unchanged           Progression   Progression -- Continue to progress workloads to maintain intensity without signs/symptoms of physical distress. Continue to progress workloads to maintain intensity without signs/symptoms of physical distress.     Average METs -- 3.27 3.7           Resistance Training   Training Prescription -- Yes Yes     Weight 4 lb 4 lb 5 lb     Reps 10-15 10-15 10-15           Interval Training   Interval Training -- No No           Treadmill   MPH -- 3 2.7  Grade -- 1 1     Minutes -- 15 15     METs -- 3.71 3.44           Elliptical   Level -- 1 1     Speed -- 2.1 --     Minutes -- 4 --     METs -- 2 --           REL-XR   Level -- 4 --     Minutes -- 15 --     METs -- 3.6 --            Exercise Comments:   Exercise Goals and Review:  Exercise Goals    Row Name 05/11/20 1659             Exercise Goals   Increase Physical Activity Yes       Intervention Provide advice, education, support and counseling about physical  activity/exercise needs.;Develop an individualized exercise prescription for aerobic and resistive training based on initial evaluation findings, risk stratification, comorbidities and participant's personal goals.       Expected Outcomes Short Term: Attend rehab on a regular basis to increase amount of physical activity.;Long Term: Exercising regularly at least 3-5 days a week.;Long Term: Add in home exercise to make exercise part of routine and to increase amount of physical activity.       Increase Strength and Stamina Yes       Intervention Provide advice, education, support and counseling about physical activity/exercise needs.;Develop an individualized exercise prescription for aerobic and resistive training based on initial evaluation findings, risk stratification, comorbidities and participant's personal goals.       Expected Outcomes Short Term: Increase workloads from initial exercise prescription for resistance, speed, and METs.;Short Term: Perform resistance training exercises routinely during rehab and add in resistance training at home;Long Term: Improve cardiorespiratory fitness, muscular endurance and strength as measured by increased METs and functional capacity ( )       Able to understand and use rate of perceived exertion (RPE) scale Yes       Intervention Provide education and explanation on how to use RPE scale       Expected Outcomes Short Term: Able to use RPE daily in rehab to express subjective intensity level;Long Term:  Able to use RPE to guide intensity level when exercising independently       Able to understand and use Dyspnea scale Yes       Intervention Provide education and explanation on how to use Dyspnea scale       Expected Outcomes Short Term: Able to use Dyspnea scale daily in rehab to express subjective sense of shortness of breath during exertion;Long Term: Able to use Dyspnea scale to guide intensity level when exercising independently       Knowledge and  understanding of Target Heart Rate Range (THRR) Yes       Intervention Provide education and explanation of THRR including how the numbers were predicted and where they are located for reference       Expected Outcomes Short Term: Able to state/look up THRR;Long Term: Able to use THRR to govern intensity when exercising independently;Short Term: Able to use daily as guideline for intensity in rehab       Able to check pulse independently Yes       Intervention Provide education and demonstration on how to check pulse in carotid and radial arteries.;Review the importance of being able to check your own pulse for safety  during independent exercise       Expected Outcomes Short Term: Able to explain why pulse checking is important during independent exercise;Long Term: Able to check pulse independently and accurately       Understanding of Exercise Prescription Yes       Intervention Provide education, explanation, and written materials on patient's individual exercise prescription       Expected Outcomes Short Term: Able to explain program exercise prescription;Long Term: Able to explain home exercise prescription to exercise independently              Exercise Goals Re-Evaluation :  Exercise Goals Re-Evaluation    Row Name 05/17/20 1658 05/31/20 1429 06/12/20 1333 06/14/20 1318       Exercise Goal Re-Evaluation   Exercise Goals Review Increase Physical Activity;Able to understand and use rate of perceived exertion (RPE) scale;Knowledge and understanding of Target Heart Rate Range (THRR);Understanding of Exercise Prescription;Increase Strength and Stamina;Able to check pulse independently Increase Physical Activity;Increase Strength and Stamina;Understanding of Exercise Prescription Increase Physical Activity;Increase Strength and Stamina;Understanding of Exercise Prescription Increase Physical Activity;Increase Strength and Stamina;Understanding of Exercise Prescription    Comments Reviewed RPE  and dyspnea scales, THR and program prescription with pt today.  Pt voiced understanding and was given a copy of goals to take home. Hortense  is doing well in rehab.  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  She is up to 2 minutes on there at a time.  We will continue to monitor her progress. Babette is doing well in rehab.  She is doing yoga on of her off days(5x/wk). She is also walking daily for about 20 min.  We talked about adding in more time to her walks at home. Vaanya is doing well in rehab.  She is doing yoga on of her off days(5x/wk). She is also walking daily for about 20 min.  We talked about adding in more time to her walks at home.    Expected Outcomes Short: Use RPE daily to regulate intensity. Long: Follow program prescription in THR. Short: Continue to improve on elliptical Long: Continue to increase stamina. Short: Continue to add time to walks at home Long; Continue to exercise on off days. Short: Continue to add time to walks at home Long; Continue to exercise on off days.           Discharge Exercise Prescription (Final Exercise Prescription Changes):  Exercise Prescription Changes - 06/14/20 1300      Response to Exercise   Blood Pressure (Admit) 114/70    Blood Pressure (Exercise) 140/82    Blood Pressure (Exit) 122/62    Heart Rate (Admit) 85 bpm    Heart Rate (Exercise) 123 bpm    Heart Rate (Exit) 97 bpm    Rating of Perceived Exertion (Exercise) 15    Symptoms none    Duration Continue with 30 min of aerobic exercise without signs/symptoms of physical distress.    Intensity THRR unchanged      Progression   Progression Continue to progress workloads to maintain intensity without signs/symptoms of physical distress.    Average METs 3.7      Resistance Training   Training Prescription Yes    Weight 5 lb    Reps 10-15      Interval Training   Interval Training No      Treadmill   MPH 2.7    Grade 1    Minutes 15    METs 3.44  Elliptical   Level 1            Nutrition:  Target Goals: Understanding of nutrition guidelines, daily intake of sodium 1500mg , cholesterol 200mg , calories 30% from fat and 7% or less from saturated fats, daily to have 5 or more servings of fruits and vegetables.  Education: All About Nutrition: -Group instruction provided by verbal, written material, interactive activities, discussions, models, and posters to present general guidelines for heart healthy nutrition including fat, fiber, MyPlate, the role of sodium in heart healthy nutrition, utilization of the nutrition label, and utilization of this knowledge for meal planning. Follow up email sent as well. Written material given at graduation. Flowsheet Row Cardiac Rehab from 06/07/2020 in [facility] Cardiac and Pulmonary Rehab  Date 06/07/20  Educator [facility]  Instruction Review Code 1- Verbalizes Understanding      Biometrics:  Pre Biometrics - 05/11/20 1613      Pre Biometrics   Height 5' 7.5" (1.715 m)    Weight 165 lb (74.8 kg)    BMI (Calculated) 25.45    Single Leg Stand 30 seconds            Nutrition Therapy Plan and Nutrition Goals:   Nutrition Assessments:  Nutrition Assessments - 05/11/20 1604      MEDFICTS Scores   Pre Score 32          MEDIFICTS Score Key:  ?70 Need to make dietary changes   40-70 Heart Healthy Diet  ? 40 Therapeutic Level Cholesterol Diet   Picture Your Plate Scores:  <56 Unhealthy dietary pattern with much room for improvement.  41-50 Dietary pattern unlikely to meet recommendations for good health and room for improvement.  51-60 More healthful dietary pattern, with some room for improvement.   >60 Healthy dietary pattern, although there may be some specific behaviors that could be improved.    Nutrition Goals Re-Evaluation:  Nutrition Goals Re-Evaluation    Row Name 06/12/20 1339             Goals   Nutrition Goal Schedule to meet with Melissa and get questions answered       Comment Pt  wants to meet with dietitian to get questions answered       Expected Outcome Meet with dietitian              Nutrition Goals Discharge (Final Nutrition Goals Re-Evaluation):  Nutrition Goals Re-Evaluation - 06/12/20 1339      Goals   Nutrition Goal Schedule to meet with Melissa and get questions answered    Comment Pt wants to meet with dietitian to get questions answered    Expected Outcome Meet with dietitian           Psychosocial: Target Goals: Acknowledge presence or absence of significant depression and/or stress, maximize coping skills, provide positive support system. Participant is able to verbalize types and ability to use techniques and skills needed for reducing stress and depression.   Education: Stress, Anxiety, and Depression - Group verbal and visual presentation to define topics covered.  Reviews how body is impacted by stress, anxiety, and depression.  Also discusses healthy ways to reduce stress and to treat/manage anxiety and depression.  Written material given at graduation.   Education: Sleep Hygiene -Provides group verbal and written instruction about how sleep can affect your health.  Define sleep hygiene, discuss sleep cycles and impact of sleep habits. Review good sleep hygiene tips.    Initial Review & Psychosocial Screening:  Initial Psych  Review & Screening - 05/05/20 0944      Initial Review   Current issues with Current Anxiety/Panic;Current Stress Concerns;Current Sleep Concerns;History of Depression;Current Psychotropic Meds    Source of Stress Concerns --    Comments Post MI, history of anxiety/depression      Family Dynamics   Good Support System? Yes      Barriers   Psychosocial barriers to participate in program There are no identifiable barriers or psychosocial needs.;The patient should benefit from training in stress management and relaxation.      Screening Interventions   Interventions Encouraged to exercise;Provide feedback  about the scores to participant;To provide support and resources with identified psychosocial needs    Expected Outcomes Short Term goal: Utilizing psychosocial counselor, staff and physician to assist with identification of specific Stressors or current issues interfering with healing process. Setting desired goal for each stressor or current issue identified.;Long Term Goal: Stressors or current issues are controlled or eliminated.;Short Term goal: Identification and review with participant of any Quality of Life or Depression concerns found by scoring the questionnaire.;Long Term goal: The participant improves quality of Life and PHQ9 Scores as seen by post scores and/or verbalization of changes           Quality of Life Scores:   Quality of Life - 05/11/20 1603      Quality of Life   Select Quality of Life      Quality of Life Scores   Health/Function Pre 12.53 %    Socioeconomic Pre 20.5 %    Psych/Spiritual Pre 15 %    Family Pre 23 %    GLOBAL Pre 15.8 %          Scores of 19 and below usually indicate a poorer quality of life in these areas.  A difference of  2-3 points is a clinically meaningful difference.  A difference of 2-3 points in the total score of the Quality of Life Index has been associated with significant improvement in overall quality of life, self-image, physical symptoms, and general health in studies assessing change in quality of life.  PHQ-9: Recent Review Flowsheet Data    Depression screen [facility] 2/9 05/11/2020   Decreased Interest 1   Down, Depressed, Hopeless 2   PHQ - 2 Score 3   Altered sleeping 2   Tired, decreased energy 1   Change in appetite 1   Feeling bad or failure about yourself  0   Trouble concentrating 0   Moving slowly or fidgety/restless 0   Suicidal thoughts 0   PHQ-9 Score 7   Difficult doing work/chores Somewhat difficult     Interpretation of Total Score  Total Score Depression Severity:  1-4 = Minimal depression, 5-9 = Mild  depression, 10-14 = Moderate depression, 15-19 = Moderately severe depression, 20-27 = Severe depression   Psychosocial Evaluation and Intervention:  Psychosocial Evaluation - 05/05/20 1004      Psychosocial Evaluation & Interventions   Comments Odessa is working very hard post MI to get back into a good mental state. She had her MI while trying to relax, so she is currently taking anxiety meds to help sleep. She is seeing a therapist for anxiety, attending relaxation classes, and has been given an extension from her work to be out til December. She is really looking forward to coming to Cardiac Rehab so she can learn more about her health. She quit smoking after her heart attack is very motivated to stay away from smoking.  Her treatment for diabetes has had to go through recent changes in response to her fluctuating CBG readings.    Expected Outcomes Short: attend cardiac rehab for education and exercise. Long: maintain positive self care habits.    Continue Psychosocial Services  Follow up required by staff           Psychosocial Re-Evaluation:  Psychosocial Re-Evaluation    Row Name 06/12/20 1336             Psychosocial Re-Evaluation   Current issues with Current Anxiety/Panic;Current Stress Concerns       Comments Kirby is doing well in rehab.  She has "graduated" from her group therapy class and now see a therapist twice a week. She is really liking her and doing well overall.  She is sleeping well and sleeps til her alarm goes off.       Expected Outcomes SHort: Continue with new therapist  Long: Continue to manage anxiety.       Interventions Encouraged to attend Cardiac Rehabilitation for the exercise       Continue Psychosocial Services  Follow up required by staff              Psychosocial Discharge (Final Psychosocial Re-Evaluation):  Psychosocial Re-Evaluation - 06/12/20 1336      Psychosocial Re-Evaluation   Current issues with Current Anxiety/Panic;Current Stress  Concerns    Comments Reannah is doing well in rehab.  She has "graduated" from her group therapy class and now see a therapist twice a week. She is really liking her and doing well overall.  She is sleeping well and sleeps til her alarm goes off.    Expected Outcomes SHort: Continue with new therapist  Long: Continue to manage anxiety.    Interventions Encouraged to attend Cardiac Rehabilitation for the exercise    Continue Psychosocial Services  Follow up required by staff           Vocational Rehabilitation: Provide vocational rehab assistance to qualifying candidates.   Vocational Rehab Evaluation & Intervention:  Vocational Rehab - 05/05/20 0942      Initial Vocational Rehab Evaluation & Intervention   Assessment shows need for Vocational Rehabilitation No           Education: Education Goals: Education classes will be provided on a variety of topics geared toward better understanding of heart health and risk factor modification. Participant will state understanding/return demonstration of topics presented as noted by education test scores.  Learning Barriers/Preferences:  Learning Barriers/Preferences - 05/05/20 0942      Learning Barriers/Preferences   Learning Barriers None    Learning Preferences None           General Cardiac Education Topics:  AED/CPR: - Group verbal and written instruction with the use of models to demonstrate the basic use of the AED with the basic ABC's of resuscitation.   Anatomy and Cardiac Procedures: - Group verbal and visual presentation and models provide information about basic cardiac anatomy and function. Reviews the testing methods done to diagnose heart disease and the outcomes of the test results. Describes the treatment choices: Medical Management, Angioplasty, or Coronary Bypass Surgery for treating various heart conditions including Myocardial Infarction, Angina, Valve Disease, and Cardiac Arrhythmias.  Written material given at  graduation. Flowsheet Row Cardiac Rehab from 06/07/2020 in [facility] Cardiac and Pulmonary Rehab  Date 05/24/20  Educator [facility]  Instruction Review Code 1- Verbalizes Understanding      Medication Safety: - Group verbal and visual instruction to  review commonly prescribed medications for heart and lung disease. Reviews the medication, class of the drug, and side effects. Includes the steps to properly store meds and maintain the prescription regimen.  Written material given at graduation.   Intimacy: - Group verbal instruction through game format to discuss how heart and lung disease can affect sexual intimacy. Written material given at graduation.. Flowsheet Row Cardiac Rehab from 06/07/2020 in [facility] Cardiac and Pulmonary Rehab  Date 05/17/20  Educator [facility]  Instruction Review Code 1- Verbalizes Understanding      Know Your Numbers and Heart Failure: - Group verbal and visual instruction to discuss disease risk factors for cardiac and pulmonary disease and treatment options.  Reviews associated critical values for Overweight/Obesity, Hypertension, Cholesterol, and Diabetes.  Discusses basics of heart failure: signs/symptoms and treatments.  Introduces Heart Failure Zone chart for action plan for heart failure.  Written material given at graduation.   Infection Prevention: - Provides verbal and written material to individual with discussion of infection control including proper hand washing and proper equipment cleaning during exercise session. Flowsheet Row Cardiac Rehab from 06/07/2020 in [facility] Cardiac and Pulmonary Rehab  Date 05/11/20  Educator [facility]  Instruction Review Code 1- Verbalizes Understanding      Falls Prevention: - Provides verbal and written material to individual with discussion of falls prevention and safety. Flowsheet Row Cardiac Rehab from 06/07/2020 in [facility] Cardiac and Pulmonary Rehab  Date 05/11/20  Educator [facility]  Instruction Review Code 1- Verbalizes Understanding       Other: -Provides group and verbal instruction on various topics (see comments)   Knowledge Questionnaire Score:  Knowledge Questionnaire Score - 05/11/20 1702      Knowledge Questionnaire Score   Pre Score 26/26           Core Components/Risk Factors/Patient Goals at Admission:  Personal Goals and Risk Factors at Admission - 05/11/20 1700      Core Components/Risk Factors/Patient Goals on Admission    Weight Management Yes;Weight Maintenance    Intervention Weight Management/Obesity: Establish reasonable short term and long term weight goals.;Weight Management: Provide education and appropriate resources to help participant work on and attain dietary goals.;Weight Management: Develop a combined nutrition and exercise program designed to reach desired caloric intake, while maintaining appropriate intake of nutrient and fiber, sodium and fats, and appropriate energy expenditure required for the weight goal.    Admit Weight 165 lb (74.8 kg)    Goal Weight: Short Term 165 lb (74.8 kg)    Goal Weight: Long Term 165 lb (74.8 kg)    Expected Outcomes Short Term: Continue to assess and modify interventions until short term weight is achieved;Weight Maintenance: Understanding of the daily nutrition guidelines, which includes 25-35% calories from fat, 7% or less cal from saturated fats, less than 200mg  cholesterol, less than 1.5gm of sodium, & 5 or more servings of fruits and vegetables daily;Understanding recommendations for meals to include 15-35% energy as protein, 25-35% energy from fat, 35-60% energy from carbohydrates, less than 200mg  of dietary cholesterol, 20-35 gm of total fiber daily;Understanding of distribution of calorie intake throughout the day with the consumption of 4-5 meals/snacks    Tobacco Cessation Yes    Intervention Offer self-teaching materials, assist with locating and accessing local/national Quit Smoking programs, and support quit date choice.;Assist the participant  in steps to quit. Provide individualized education and counseling about committing to Tobacco Cessation, relapse prevention, and pharmacological support that can be provided by physician.    Expected Outcomes Short  Term: Will demonstrate readiness to quit, by selecting a quit date.;Long Term: Complete abstinence from all tobacco products for at least 12 months from quit date.;Short Term: Will quit all tobacco product use, adhering to prevention of relapse plan.    Diabetes Yes    Intervention Provide education about signs/symptoms and action to take for hypo/hyperglycemia.;Provide education about proper nutrition, including hydration, and aerobic/resistive exercise prescription along with prescribed medications to achieve blood glucose in normal ranges: Fasting glucose 65-99 mg/dL    Expected Outcomes Short Term: Participant verbalizes understanding of the signs/symptoms and immediate care of hyper/hypoglycemia, proper foot care and importance of medication, aerobic/resistive exercise and nutrition plan for blood glucose control.;Long Term: Attainment of HbA1C < 7%.    Hypertension Yes    Intervention Provide education on lifestyle modifcations including regular physical activity/exercise, weight management, moderate sodium restriction and increased consumption of fresh fruit, vegetables, and low fat dairy, alcohol moderation, and smoking cessation.;Monitor prescription use compliance.    Expected Outcomes Short Term: Continued assessment and intervention until BP is < 140/66mm HG in hypertensive participants. < 130/33mm HG in hypertensive participants with diabetes, heart failure or chronic kidney disease.;Long Term: Maintenance of blood pressure at goal levels.    Lipids Yes    Intervention Provide education and support for participant on nutrition & aerobic/resistive exercise along with prescribed medications to achieve LDL 70mg , HDL >40mg .    Expected Outcomes Short Term: Participant states  understanding of desired cholesterol values and is compliant with medications prescribed. Participant is following exercise prescription and nutrition guidelines.;Long Term: Cholesterol controlled with medications as prescribed, with individualized exercise RX and with personalized nutrition plan. Value goals: LDL < , HDL > 40 mg.           Education:Diabetes - Individual verbal and written instruction to review signs/symptoms of diabetes, desired ranges of glucose level fasting, after meals and with exercise. Acknowledge that pre and post exercise glucose checks will be done for 3 sessions at entry of program. Flowsheet Row Cardiac Rehab from 05/05/2020 in [facility] Cardiac and Pulmonary Rehab  Date 05/05/20  Educator [facility]  Instruction Review Code 1- Verbalizes Understanding      Core Components/Risk Factors/Patient Goals Review:   Goals and Risk Factor Review    Row Name 06/12/20 1339             Core Components/Risk Factors/Patient Goals Review   Personal Goals Review Weight Management/Obesity;Tobacco Cessation;Diabetes;Hypertension;Lipids       Review Satin is doing well in rehab.  Her weight has been stable but her mood medication she is currently on causes her to get hangry, gain weight, and increase lipids.  She is going to pyschiatrist on Tuesday and plans to talk with them about chaning medicaiton to something more heart friendly. She is doing well off her cigarettes.  She has been switching things out for cigarette and finding that she can switch these out and trick her brain.  She is doing well with her blood pressures and she does check them at home.  She does not that if she sees higher numbers it can set off her anxiety.  So we talked about how auto cuffs can tend to read higher and not to let it get to her too much.  Kamsiyochukwu's sugars have also been running higher and she is also hoping this may be related to her meds too.       Expected Outcomes Short: Talk to doctor about meds  Long: Continue to manage risk factors.  Core Components/Risk Factors/Patient Goals at Discharge (Final Review):   Goals and Risk Factor Review - 06/12/20 1339      Core Components/Risk Factors/Patient Goals Review   Personal Goals Review Weight Management/Obesity;Tobacco Cessation;Diabetes;Hypertension;Lipids    Review Tambi is doing well in rehab.  Her weight has been stable but her mood medication she is currently on causes her to get hangry, gain weight, and increase lipids.  She is going to pyschiatrist on Tuesday and plans to talk with them about chaning medicaiton to something more heart friendly. She is doing well off her cigarettes.  She has been switching things out for cigarette and finding that she can switch these out and trick her brain.  She is doing well with her blood pressures and she does check them at home.  She does not that if she sees higher numbers it can set off her anxiety.  So we talked about how auto cuffs can tend to read higher and not to let it get to her too much.  Desirie's sugars have also been running higher and she is also hoping this may be related to her meds too.    Expected Outcomes Short: Talk to doctor about meds Long: Continue to manage risk factors.           ITP Comments:  ITP Comments    Row Name 05/05/20 0936 05/11/20 1645 05/11/20 1646 05/17/20 1654 05/31/20 0610   ITP Comments Initial telephone orientation completed. Diagnosis can be found in [facility] 9/8.EP orientation scheduled for 10/14 at 2pm Completed and gym orientation. Initial ITP created and sent for review to Dr. Bethann Punches, Medical Director. Tassie is a current tobacco user. Intervention for tobacco cessation was provided at the initial medical review. She was asked about readiness to quit. Quiana stated she had quit before but started up again. She is up to 5 cigarettes/day and is ready to quit again. We provided her with a fake cigarette to help cope with the oral fixation.  Patient was advised and educated about tobacco cessation using combination therapy, tobacco cessation classes, quit line, and quit smoking apps. Patient demonstrated understanding of this material. Staff will continue to provide encouragement and follow up with the patient throughout the program. First full day of exercise!  Patient was oriented to gym and equipment including functions, settings, policies, and procedures.  Patient's individual exercise prescription and treatment plan were reviewed.  All starting workloads were established based on the results of the 6 minute walk test done at initial orientation visit.  The plan for exercise progression was also introduced and progression will be customized based on patient's performance and goals. 30 Day review completed. Medical Director ITP review done, changes made as directed, and signed approval by Medical Director.   Row Name 06/01/20 1344 06/28/20 0910 06/28/20 1306 07/05/20 1414 07/12/20 1537   ITP Comments Patient called and reported she had a fever and is getting COVID tested. Will cancel [facility] appointments until we hear on results. Patient agreed. 30 Day review completed. Medical Director ITP review done, changes made as directed, and signed approval by Medical Director.  HAs not attended since 11/15 Aella has not attended since last review Called pt to check in.  Has not been here since grandmother passed.  Left message. Called to check in on Colette as she hasn't been here since 06/12/20. We have not heard from her since her grandmas passing the week of Thanksgiving. Unfortunately, we will have to discharge her in 2 weeks  if we do not hear back from her. Sent letter just in case she is not receiving our calls.   Row Name 07/25/20 1245 07/26/20 0526         ITP Comments Irving Burtonmily has not attended since 06/12/20. 30 Day review completed. Medical Director ITP review done, changes made as directed, and signed approval by Medical Director.              Comments:

## 2020-07-27 DIAGNOSIS — Z955 Presence of coronary angioplasty implant and graft: Secondary | ICD-10-CM

## 2020-07-27 DIAGNOSIS — I214 Non-ST elevation (NSTEMI) myocardial infarction: Secondary | ICD-10-CM

## 2020-07-27 NOTE — Progress Notes (Signed)
Cardiac Individual Treatment Plan  Patient Details  Name: Alicia Phillips MRN: [id number] Date of Birth: [birth year] Referring Provider:    Initial Encounter Date:  Flowsheet Row Cardiac Rehab from 05/11/2020 in [facility] Cardiac and Pulmonary Rehab  Date 05/11/20      Visit Diagnosis: No diagnosis found.  Patient's Home Medications on Admission:  Current Outpatient Medications:  .  ACETAMINOPHEN EXTRA STRENGTH 500 MG tablet, Take 500 mg by mouth every 4 (four) hours as needed for mild pain or headache. , Disp: , Rfl:  .  ARIPiprazole (ABILIFY) 10 MG tablet, Take 1 tablet (10 mg total) by mouth daily., Disp: 30 tablet, Rfl: 2 .  ARIPiprazole (ABILIFY) 5 MG tablet, Take 1 tablet (5 mg total) by mouth daily., Disp: 30 tablet, Rfl: 0 .  aspirin 81 MG chewable tablet, Chew 1 tablet (81 mg total) by mouth daily., Disp: , Rfl:  .  atorvastatin (LIPITOR) 80 MG tablet, Take 1 tablet (80 mg total) by mouth daily., Disp: 90 tablet, Rfl: 3 .  Continuous Blood Gluc Sensor (FREESTYLE LIBRE 14 DAY SENSOR) MISC, Apply topically as directed., Disp: , Rfl:  .  doxycycline (VIBRAMYCIN) 50 MG capsule, Take 1 capsule (50 mg total) by mouth daily., Disp: 30 capsule, Rfl: 0 .  INPEN 100-PINK-NOVO DEVI, 3 (three) times daily., Disp: , Rfl:  .  insulin aspart (NOVOLOG) 100 UNIT/ML injection, Inject 0-20 Units into the skin 3 (three) times daily before meals. Per sliding scale, Disp: , Rfl:  .  lamoTRIgine (LAMICTAL) 100 MG tablet, Take 1 tablet (100 mg total) by mouth at bedtime. Take one tablet at bedtime., Disp: 30 tablet, Rfl: 1 .  metoprolol succinate (TOPROL XL) 25 MG 24 hr tablet, Take 1 tablet (25 mg total) by mouth in the morning and at bedtime., Disp: 180 tablet, Rfl: 3 .  nitroGLYCERIN (NITROSTAT) 0.4 MG SL tablet, Place 1 tablet (0.4 mg total) under the tongue every 5 (five) minutes as needed for chest pain., Disp: 25 tablet, Rfl: 2 .  NOVOFINE PEN NEEDLE 32G X 6 MM MISC, SMARTSIG:Injection 5 Times Daily,  Disp: , Rfl:  .  ticagrelor (BRILINTA) 90 MG TABS tablet, Take 1 tablet (90 mg total) by mouth 2 (two) times daily., Disp: 60 tablet, Rfl: 11 .  TRESIBA FLEXTOUCH 100 UNIT/ML FlexTouch Pen, Inject 25 Units into the skin daily. , Disp: , Rfl:   Current Facility-Administered Medications:  .  iron polysaccharides (NIFEREX) capsule 150 mg, 150 mg, Oral, Daily, [facility], [place], [place]  Past Medical History: Past Medical History:  Diagnosis Date  . Anxiety   . Bipolar disorder (HCC)   . CAD (coronary artery disease)    a. LHC 04/05/20: 95% stenosis of mid LAD s/p DES, 30% stenosis of proximal to mid LAD  . Diabetes mellitus type 1 (HCC)   . History of borderline personality disorder   . Hyperlipidemia   . Major depression   . STEMI (ST elevation myocardial infarction) (HCC) 04/05/2020  . Tobacco use     Tobacco Use: Social History   Tobacco Use  Smoking Status Former Smoker  . Types: Cigarettes  Smokeless Tobacco Never Used  Tobacco Comment   patient stated she was smoking but has quit since 05/30/20    Labs: Recent Review Flowsheet Data    Labs for ITP Cardiac and Pulmonary Rehab Latest Ref Rng & Units 04/06/2020   Cholestrol 0 - 200 mg/dL 696   LDLCALC 0 - 99 mg/dL 295(M)   HDL >84 mg/dL 50  Trlycerides <150 mg/dL 96   Hemoglobin U9W 4.8 - 5.6 % 8.6(H)       Exercise Target Goals: Exercise Program Goal: Individual exercise prescription set using results from initial 6 min walk test and THRR while considering  patient's activity barriers and safety.   Exercise Prescription Goal: Initial exercise prescription builds to 30-45 minutes a day of aerobic activity, 2-3 days per week.  Home exercise guidelines will be given to patient during program as part of exercise prescription that the participant will acknowledge.   Education: Aerobic Exercise: - Group verbal and visual presentation on the components of exercise prescription. Introduces F.I.T.T principle from ACSM for  exercise prescriptions.  Reviews F.I.T.T. principles of aerobic exercise including progression. Written material given at graduation. Flowsheet Row Cardiac Rehab from 06/07/2020 in [facility] Cardiac and Pulmonary Rehab  Date 05/24/20  [resistance 10/27/201]  Educator [facility]  Instruction Review Code 1- [employer] Understanding      Education: Resistance Exercise: - Group verbal and visual presentation on the components of exercise prescription. Introduces F.I.T.T principle from ACSM for exercise prescriptions  Reviews F.I.T.T. principles of resistance exercise including progression. Written material given at graduation.    Education: Exercise & Equipment Safety: - Individual verbal instruction and demonstration of equipment use and safety with use of the equipment. Flowsheet Row Cardiac Rehab from 06/07/2020 in [facility] Cardiac and Pulmonary Rehab  Date 05/11/20  Educator [facility]  Instruction Review Code 1- Verbalizes Understanding      Education: Exercise Physiology & General Exercise Guidelines: - Group verbal and written instruction with models to review the exercise physiology of the cardiovascular system and associated critical values. Provides general exercise guidelines with specific guidelines to those with heart or lung disease.    Education: Flexibility, Balance, Mind/Body Relaxation: - Group verbal and visual presentation with interactive activity on the components of exercise prescription. Introduces F.I.T.T principle from ACSM for exercise prescriptions. Reviews F.I.T.T. principles of flexibility and balance exercise training including progression. Also discusses the mind body connection.  Reviews various relaxation techniques to help reduce and manage stress (i.e. Deep breathing, progressive muscle relaxation, and visualization). Balance handout provided to take home. Written material given at graduation.   Activity Barriers & Risk Stratification:  Activity Barriers & Cardiac Risk  Stratification - 05/11/20 1654      Activity Barriers & Cardiac Risk Stratification   Activity Barriers None    Cardiac Risk Stratification High           6 Minute Walk:  6 Minute Walk    Row Name 05/11/20 1614         6 Minute Walk   Phase Initial     Distance 1287 feet     Walk Time 6 minutes     # of Rest Breaks 0     MPH 2.43     METS 4.6     RPE 8     Perceived Dyspnea  0     VO2 Peak 16.1     Symptoms No     Resting HR 86 bpm     Resting BP 116/78     Resting Oxygen Saturation  99 %     Exercise Oxygen Saturation  during 6 min walk 98 %     Max Ex. HR 97 bpm     Max Ex. BP 122/74     2 Minute Post BP 118/76            Oxygen Initial Assessment:   Oxygen Re-Evaluation:  Oxygen Discharge (Final Oxygen Re-Evaluation):   Initial Exercise Prescription:  Initial Exercise Prescription - 05/11/20 1600      Date of Initial Exercise RX and Referring Provider   Date 05/11/20      Treadmill   MPH 2.4    Grade 1    Minutes 15    METs 3.17      Recumbant Elliptical   Level 2    RPM 50    Minutes 15    METs 4.6      Elliptical   Level 1    Speed 2.1    Minutes 15    METs 4.6      REL-XR   Level 3    Speed 50    Minutes 15    METs 4.6      Prescription Details   Frequency (times per week) 3    Duration Progress to 30 minutes of continuous aerobic without signs/symptoms of physical distress      Intensity   THRR 40-80% of Max Heartrate 124-162    Ratings of Perceived Exertion 11-13    Perceived Dyspnea 0-4      Progression   Progression Continue to progress workloads to maintain intensity without signs/symptoms of physical distress.      Resistance Training   Training Prescription Yes           Perform Capillary Blood Glucose checks as needed.  Exercise Prescription Changes:  Exercise Prescription Changes    Row Name 05/11/20 1600 05/31/20 1400 06/14/20 1300         Response to Exercise   Blood Pressure (Admit) 116/78  98/52 114/70     Blood Pressure (Exercise) 122/74 134/64 140/82     Blood Pressure (Exit) 118/76 124/64 122/62     Heart Rate (Admit) 86 bpm 97 bpm 85 bpm     Heart Rate (Exercise) 97 bpm 130 bpm 123 bpm     Heart Rate (Exit) 88 bpm 98 bpm 97 bpm     Oxygen Saturation (Admit) 99 % -- --     Oxygen Saturation (Exercise) 98 % -- --     Oxygen Saturation (Exit) 98 % -- --     Rating of Perceived Exertion (Exercise) 8 14 15      Perceived Dyspnea (Exercise) 0 -- --     Symptoms none none none     Comments walk test results -- --     Duration Progress to 30 minutes of  aerobic without signs/symptoms of physical distress Continue with 30 min of aerobic exercise without signs/symptoms of physical distress. Continue with 30 min of aerobic exercise without signs/symptoms of physical distress.     Intensity -- THRR unchanged THRR unchanged           Progression   Progression -- Continue to progress workloads to maintain intensity without signs/symptoms of physical distress. Continue to progress workloads to maintain intensity without signs/symptoms of physical distress.     Average METs -- 3.27 3.7           Resistance Training   Training Prescription -- Yes Yes     Weight 4 lb 4 lb 5 lb     Reps 10-15 10-15 10-15           Interval Training   Interval Training -- No No           Treadmill   MPH -- 3 2.7     Grade -- 1 1     Minutes --  15 15     METs -- 3.71 3.44           Elliptical   Level -- 1 1     Speed -- 2.1 --     Minutes -- 4 --     METs -- 2 --           REL-XR   Level -- 4 --     Minutes -- 15 --     METs -- 3.6 --            Exercise Comments:   Exercise Goals and Review:  Exercise Goals    Row Name 05/11/20 1659             Exercise Goals   Increase Physical Activity Yes       Intervention Provide advice, education, support and counseling about physical activity/exercise needs.;Develop an individualized exercise prescription for aerobic and  resistive training based on initial evaluation findings, risk stratification, comorbidities and participant's personal goals.       Expected Outcomes Short Term: Attend rehab on a regular basis to increase amount of physical activity.;Long Term: Exercising regularly at least 3-5 days a week.;Long Term: Add in home exercise to make exercise part of routine and to increase amount of physical activity.       Increase Strength and Stamina Yes       Intervention Provide advice, education, support and counseling about physical activity/exercise needs.;Develop an individualized exercise prescription for aerobic and resistive training based on initial evaluation findings, risk stratification, comorbidities and participant's personal goals.       Expected Outcomes Short Term: Increase workloads from initial exercise prescription for resistance, speed, and METs.;Short Term: Perform resistance training exercises routinely during rehab and add in resistance training at home;Long Term: Improve cardiorespiratory fitness, muscular endurance and strength as measured by increased METs and functional capacity ( )       Able to understand and use rate of perceived exertion (RPE) scale Yes       Intervention Provide education and explanation on how to use RPE scale       Expected Outcomes Short Term: Able to use RPE daily in rehab to express subjective intensity level;Long Term:  Able to use RPE to guide intensity level when exercising independently       Able to understand and use Dyspnea scale Yes       Intervention Provide education and explanation on how to use Dyspnea scale       Expected Outcomes Short Term: Able to use Dyspnea scale daily in rehab to express subjective sense of shortness of breath during exertion;Long Term: Able to use Dyspnea scale to guide intensity level when exercising independently       Knowledge and understanding of Target Heart Rate Range (THRR) Yes       Intervention Provide education and  explanation of THRR including how the numbers were predicted and where they are located for reference       Expected Outcomes Short Term: Able to state/look up THRR;Long Term: Able to use THRR to govern intensity when exercising independently;Short Term: Able to use daily as guideline for intensity in rehab       Able to check pulse independently Yes       Intervention Provide education and demonstration on how to check pulse in carotid and radial arteries.;Review the importance of being able to check your own pulse for safety during independent exercise       Expected  Outcomes Short Term: Able to explain why pulse checking is important during independent exercise;Long Term: Able to check pulse independently and accurately       Understanding of Exercise Prescription Yes       Intervention Provide education, explanation, and written materials on patient's individual exercise prescription       Expected Outcomes Short Term: Able to explain program exercise prescription;Long Term: Able to explain home exercise prescription to exercise independently              Exercise Goals Re-Evaluation :  Exercise Goals Re-Evaluation    Row Name 05/17/20 1658 05/31/20 1429 06/12/20 1333 06/14/20 1318       Exercise Goal Re-Evaluation   Exercise Goals Review Increase Physical Activity;Able to understand and use rate of perceived exertion (RPE) scale;Knowledge and understanding of Target Heart Rate Range (THRR);Understanding of Exercise Prescription;Increase Strength and Stamina;Able to check pulse independently Increase Physical Activity;Increase Strength and Stamina;Understanding of Exercise Prescription Increase Physical Activity;Increase Strength and Stamina;Understanding of Exercise Prescription Increase Physical Activity;Increase Strength and Stamina;Understanding of Exercise Prescription    Comments Reviewed RPE and dyspnea scales, THR and program prescription with pt today.  Pt voiced understanding and  was given a copy of goals to take home. Shantele  is doing well in rehab.  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  She is up to 2 minutes on there at a time.  We will continue to monitor her progress. Anjolaoluwa is doing well in rehab.  She is doing yoga on of her off days(5x/wk). She is also walking daily for about 20 min.  We talked about adding in more time to her walks at home. Nyriah is doing well in rehab.  She is doing yoga on of her off days(5x/wk). She is also walking daily for about 20 min.  We talked about adding in more time to her walks at home.    Expected Outcomes Short: Use RPE daily to regulate intensity. Long: Follow program prescription in THR. Short: Continue to improve on elliptical Long: Continue to increase stamina. Short: Continue to add time to walks at home Long; Continue to exercise on off days. Short: Continue to add time to walks at home Long; Continue to exercise on off days.           Discharge Exercise Prescription (Final Exercise Prescription Changes):  Exercise Prescription Changes - 06/14/20 1300      Response to Exercise   Blood Pressure (Admit) 114/70    Blood Pressure (Exercise) 140/82    Blood Pressure (Exit) 122/62    Heart Rate (Admit) 85 bpm    Heart Rate (Exercise) 123 bpm    Heart Rate (Exit) 97 bpm    Rating of Perceived Exertion (Exercise) 15    Symptoms none    Duration Continue with 30 min of aerobic exercise without signs/symptoms of physical distress.    Intensity THRR unchanged      Progression   Progression Continue to progress workloads to maintain intensity without signs/symptoms of physical distress.    Average METs 3.7      Resistance Training   Training Prescription Yes    Weight 5 lb    Reps 10-15      Interval Training   Interval Training No      Treadmill   MPH 2.7    Grade 1    Minutes 15    METs 3.44      Elliptical   Level 1  Nutrition:  Target Goals: Understanding of nutrition guidelines, daily  intake of sodium 1500mg , cholesterol 200mg , calories 30% from fat and 7% or less from saturated fats, daily to have 5 or more servings of fruits and vegetables.  Education: All About Nutrition: -Group instruction provided by verbal, written material, interactive activities, discussions, models, and posters to present general guidelines for heart healthy nutrition including fat, fiber, MyPlate, the role of sodium in heart healthy nutrition, utilization of the nutrition label, and utilization of this knowledge for meal planning. Follow up email sent as well. Written material given at graduation. Flowsheet Row Cardiac Rehab from 06/07/2020 in [facility] Cardiac and Pulmonary Rehab  Date 06/07/20  Educator [facility]  Instruction Review Code 1- Verbalizes Understanding      Biometrics:  Pre Biometrics - 05/11/20 1613      Pre Biometrics   Height 5' 7.5" (1.715 m)    Weight 165 lb (74.8 kg)    BMI (Calculated) 25.45    Single Leg Stand 30 seconds            Nutrition Therapy Plan and Nutrition Goals:   Nutrition Assessments:  Nutrition Assessments - 05/11/20 1604      MEDFICTS Scores   Pre Score 32          MEDIFICTS Score Key:  ?70 Need to make dietary changes   40-70 Heart Healthy Diet  ? 40 Therapeutic Level Cholesterol Diet   Picture Your Plate Scores:  <16 Unhealthy dietary pattern with much room for improvement.  41-50 Dietary pattern unlikely to meet recommendations for good health and room for improvement.  51-60 More healthful dietary pattern, with some room for improvement.   >60 Healthy dietary pattern, although there may be some specific behaviors that could be improved.    Nutrition Goals Re-Evaluation:  Nutrition Goals Re-Evaluation    Row Name 06/12/20 1339             Goals   Nutrition Goal Schedule to meet with Melissa and get questions answered       Comment Pt wants to meet with dietitian to get questions answered       Expected Outcome Meet with  dietitian              Nutrition Goals Discharge (Final Nutrition Goals Re-Evaluation):  Nutrition Goals Re-Evaluation - 06/12/20 1339      Goals   Nutrition Goal Schedule to meet with Melissa and get questions answered    Comment Pt wants to meet with dietitian to get questions answered    Expected Outcome Meet with dietitian           Psychosocial: Target Goals: Acknowledge presence or absence of significant depression and/or stress, maximize coping skills, provide positive support system. Participant is able to verbalize types and ability to use techniques and skills needed for reducing stress and depression.   Education: Stress, Anxiety, and Depression - Group verbal and visual presentation to define topics covered.  Reviews how body is impacted by stress, anxiety, and depression.  Also discusses healthy ways to reduce stress and to treat/manage anxiety and depression.  Written material given at graduation.   Education: Sleep Hygiene -Provides group verbal and written instruction about how sleep can affect your health.  Define sleep hygiene, discuss sleep cycles and impact of sleep habits. Review good sleep hygiene tips.    Initial Review & Psychosocial Screening:  Initial Psych Review & Screening - 05/05/20 0944      Initial Review  Current issues with Current Anxiety/Panic;Current Stress Concerns;Current Sleep Concerns;History of Depression;Current Psychotropic Meds    Source of Stress Concerns --    Comments Post MI, history of anxiety/depression      Family Dynamics   Good Support System? Yes      Barriers   Psychosocial barriers to participate in program There are no identifiable barriers or psychosocial needs.;The patient should benefit from training in stress management and relaxation.      Screening Interventions   Interventions Encouraged to exercise;Provide feedback about the scores to participant;To provide support and resources with identified psychosocial  needs    Expected Outcomes Short Term goal: Utilizing psychosocial counselor, staff and physician to assist with identification of specific Stressors or current issues interfering with healing process. Setting desired goal for each stressor or current issue identified.;Long Term Goal: Stressors or current issues are controlled or eliminated.;Short Term goal: Identification and review with participant of any Quality of Life or Depression concerns found by scoring the questionnaire.;Long Term goal: The participant improves quality of Life and PHQ9 Scores as seen by post scores and/or verbalization of changes           Quality of Life Scores:   Quality of Life - 05/11/20 1603      Quality of Life   Select Quality of Life      Quality of Life Scores   Health/Function Pre 12.53 %    Socioeconomic Pre 20.5 %    Psych/Spiritual Pre 15 %    Family Pre 23 %    GLOBAL Pre 15.8 %          Scores of 19 and below usually indicate a poorer quality of life in these areas.  A difference of  2-3 points is a clinically meaningful difference.  A difference of 2-3 points in the total score of the Quality of Life Index has been associated with significant improvement in overall quality of life, self-image, physical symptoms, and general health in studies assessing change in quality of life.  PHQ-9: Recent Review Flowsheet Data    Depression screen [facility] 2/9 05/11/2020   Decreased Interest 1   Down, Depressed, Hopeless 2   PHQ - 2 Score 3   Altered sleeping 2   Tired, decreased energy 1   Change in appetite 1   Feeling bad or failure about yourself  0   Trouble concentrating 0   Moving slowly or fidgety/restless 0   Suicidal thoughts 0   PHQ-9 Score 7   Difficult doing work/chores Somewhat difficult     Interpretation of Total Score  Total Score Depression Severity:  1-4 = Minimal depression, 5-9 = Mild depression, 10-14 = Moderate depression, 15-19 = Moderately severe depression, 20-27 = Severe  depression   Psychosocial Evaluation and Intervention:  Psychosocial Evaluation - 05/05/20 1004      Psychosocial Evaluation & Interventions   Comments Aylah is working very hard post MI to get back into a good mental state. She had her MI while trying to relax, so she is currently taking anxiety meds to help sleep. She is seeing a therapist for anxiety, attending relaxation classes, and has been given an extension from her work to be out til December. She is really looking forward to coming to Cardiac Rehab so she can learn more about her health. She quit smoking after her heart attack is very motivated to stay away from smoking. Her treatment for diabetes has had to go through recent changes in response to her  fluctuating CBG readings.    Expected Outcomes Short: attend cardiac rehab for education and exercise. Long: maintain positive self care habits.    Continue Psychosocial Services  Follow up required by staff           Psychosocial Re-Evaluation:  Psychosocial Re-Evaluation    Row Name 06/12/20 1336             Psychosocial Re-Evaluation   Current issues with Current Anxiety/Panic;Current Stress Concerns       Comments Veanna is doing well in rehab.  She has "graduated" from her group therapy class and now see a therapist twice a week. She is really liking her and doing well overall.  She is sleeping well and sleeps til her alarm goes off.       Expected Outcomes SHort: Continue with new therapist  Long: Continue to manage anxiety.       Interventions Encouraged to attend Cardiac Rehabilitation for the exercise       Continue Psychosocial Services  Follow up required by staff              Psychosocial Discharge (Final Psychosocial Re-Evaluation):  Psychosocial Re-Evaluation - 06/12/20 1336      Psychosocial Re-Evaluation   Current issues with Current Anxiety/Panic;Current Stress Concerns    Comments Kersti is doing well in rehab.  She has "graduated" from her group therapy  class and now see a therapist twice a week. She is really liking her and doing well overall.  She is sleeping well and sleeps til her alarm goes off.    Expected Outcomes SHort: Continue with new therapist  Long: Continue to manage anxiety.    Interventions Encouraged to attend Cardiac Rehabilitation for the exercise    Continue Psychosocial Services  Follow up required by staff           Vocational Rehabilitation: Provide vocational rehab assistance to qualifying candidates.   Vocational Rehab Evaluation & Intervention:  Vocational Rehab - 05/05/20 0942      Initial Vocational Rehab Evaluation & Intervention   Assessment shows need for Vocational Rehabilitation No           Education: Education Goals: Education classes will be provided on a variety of topics geared toward better understanding of heart health and risk factor modification. Participant will state understanding/return demonstration of topics presented as noted by education test scores.  Learning Barriers/Preferences:  Learning Barriers/Preferences - 05/05/20 0942      Learning Barriers/Preferences   Learning Barriers None    Learning Preferences None           General Cardiac Education Topics:  AED/CPR: - Group verbal and written instruction with the use of models to demonstrate the basic use of the AED with the basic ABC's of resuscitation.   Anatomy and Cardiac Procedures: - Group verbal and visual presentation and models provide information about basic cardiac anatomy and function. Reviews the testing methods done to diagnose heart disease and the outcomes of the test results. Describes the treatment choices: Medical Management, Angioplasty, or Coronary Bypass Surgery for treating various heart conditions including Myocardial Infarction, Angina, Valve Disease, and Cardiac Arrhythmias.  Written material given at graduation. Flowsheet Row Cardiac Rehab from 06/07/2020 in [facility] Cardiac and Pulmonary Rehab   Date 05/24/20  Educator [facility]  Instruction Review Code 1- Verbalizes Understanding      Medication Safety: - Group verbal and visual instruction to review commonly prescribed medications for heart and lung disease. Reviews the medication, class of the  drug, and side effects. Includes the steps to properly store meds and maintain the prescription regimen.  Written material given at graduation.   Intimacy: - Group verbal instruction through game format to discuss how heart and lung disease can affect sexual intimacy. Written material given at graduation.. Flowsheet Row Cardiac Rehab from 06/07/2020 in [facility] Cardiac and Pulmonary Rehab  Date 05/17/20  Educator [facility]  Instruction Review Code 1- Verbalizes Understanding      Know Your Numbers and Heart Failure: - Group verbal and visual instruction to discuss disease risk factors for cardiac and pulmonary disease and treatment options.  Reviews associated critical values for Overweight/Obesity, Hypertension, Cholesterol, and Diabetes.  Discusses basics of heart failure: signs/symptoms and treatments.  Introduces Heart Failure Zone chart for action plan for heart failure.  Written material given at graduation.   Infection Prevention: - Provides verbal and written material to individual with discussion of infection control including proper hand washing and proper equipment cleaning during exercise session. Flowsheet Row Cardiac Rehab from 06/07/2020 in [facility] Cardiac and Pulmonary Rehab  Date 05/11/20  Educator [facility]  Instruction Review Code 1- Verbalizes Understanding      Falls Prevention: - Provides verbal and written material to individual with discussion of falls prevention and safety. Flowsheet Row Cardiac Rehab from 06/07/2020 in [facility] Cardiac and Pulmonary Rehab  Date 05/11/20  Educator [facility]  Instruction Review Code 1- Verbalizes Understanding      Other: -Provides group and verbal instruction on various topics (see  comments)   Knowledge Questionnaire Score:  Knowledge Questionnaire Score - 05/11/20 1702      Knowledge Questionnaire Score   Pre Score 26/26           Core Components/Risk Factors/Patient Goals at Admission:  Personal Goals and Risk Factors at Admission - 05/11/20 1700      Core Components/Risk Factors/Patient Goals on Admission    Weight Management Yes;Weight Maintenance    Intervention Weight Management/Obesity: Establish reasonable short term and long term weight goals.;Weight Management: Provide education and appropriate resources to help participant work on and attain dietary goals.;Weight Management: Develop a combined nutrition and exercise program designed to reach desired caloric intake, while maintaining appropriate intake of nutrient and fiber, sodium and fats, and appropriate energy expenditure required for the weight goal.    Admit Weight 165 lb (74.8 kg)    Goal Weight: Short Term 165 lb (74.8 kg)    Goal Weight: Long Term 165 lb (74.8 kg)    Expected Outcomes Short Term: Continue to assess and modify interventions until short term weight is achieved;Weight Maintenance: Understanding of the daily nutrition guidelines, which includes 25-35% calories from fat, 7% or less cal from saturated fats, less than 200mg  cholesterol, less than 1.5gm of sodium, & 5 or more servings of fruits and vegetables daily;Understanding recommendations for meals to include 15-35% energy as protein, 25-35% energy from fat, 35-60% energy from carbohydrates, less than 200mg  of dietary cholesterol, 20-35 gm of total fiber daily;Understanding of distribution of calorie intake throughout the day with the consumption of 4-5 meals/snacks    Tobacco Cessation Yes    Intervention Offer self-teaching materials, assist with locating and accessing local/national Quit Smoking programs, and support quit date choice.;Assist the participant in steps to quit. Provide individualized education and counseling about  committing to Tobacco Cessation, relapse prevention, and pharmacological support that can be provided by physician.    Expected Outcomes Short Term: Will demonstrate readiness to quit, by selecting a quit date.;Long Term: Complete abstinence from  all tobacco products for at least 12 months from quit date.;Short Term: Will quit all tobacco product use, adhering to prevention of relapse plan.    Diabetes Yes    Intervention Provide education about signs/symptoms and action to take for hypo/hyperglycemia.;Provide education about proper nutrition, including hydration, and aerobic/resistive exercise prescription along with prescribed medications to achieve blood glucose in normal ranges: Fasting glucose 65-99 mg/dL    Expected Outcomes Short Term: Participant verbalizes understanding of the signs/symptoms and immediate care of hyper/hypoglycemia, proper foot care and importance of medication, aerobic/resistive exercise and nutrition plan for blood glucose control.;Long Term: Attainment of HbA1C < 7%.    Hypertension Yes    Intervention Provide education on lifestyle modifcations including regular physical activity/exercise, weight management, moderate sodium restriction and increased consumption of fresh fruit, vegetables, and low fat dairy, alcohol moderation, and smoking cessation.;Monitor prescription use compliance.    Expected Outcomes Short Term: Continued assessment and intervention until BP is < 140/60mm HG in hypertensive participants. < 130/29mm HG in hypertensive participants with diabetes, heart failure or chronic kidney disease.;Long Term: Maintenance of blood pressure at goal levels.    Lipids Yes    Intervention Provide education and support for participant on nutrition & aerobic/resistive exercise along with prescribed medications to achieve LDL 70mg , HDL >40mg .    Expected Outcomes Short Term: Participant states understanding of desired cholesterol values and is compliant with medications  prescribed. Participant is following exercise prescription and nutrition guidelines.;Long Term: Cholesterol controlled with medications as prescribed, with individualized exercise RX and with personalized nutrition plan. Value goals: LDL < 70mg , HDL > 40 mg.           Education:Diabetes - Individual verbal and written instruction to review signs/symptoms of diabetes, desired ranges of glucose level fasting, after meals and with exercise. Acknowledge that pre and post exercise glucose checks will be done for 3 sessions at entry of program. Flowsheet Row Cardiac Rehab from 05/05/2020 in [facility] Cardiac and Pulmonary Rehab  Date 05/05/20  Educator [facility]  Instruction Review Code 1- Verbalizes Understanding      Core Components/Risk Factors/Patient Goals Review:   Goals and Risk Factor Review    Row Name 06/12/20 1339             Core Components/Risk Factors/Patient Goals Review   Personal Goals Review Weight Management/Obesity;Tobacco Cessation;Diabetes;Hypertension;Lipids       Review Linzi is doing well in rehab.  Her weight has been stable but her mood medication she is currently on causes her to get hangry, gain weight, and increase lipids.  She is going to pyschiatrist on Tuesday and plans to talk with them about chaning medicaiton to something more heart friendly. She is doing well off her cigarettes.  She has been switching things out for cigarette and finding that she can switch these out and trick her brain.  She is doing well with her blood pressures and she does check them at home.  She does not that if she sees higher numbers it can set off her anxiety.  So we talked about how auto cuffs can tend to read higher and not to let it get to her too much.  Marge's sugars have also been running higher and she is also hoping this may be related to her meds too.       Expected Outcomes Short: Talk to doctor about meds Long: Continue to manage risk factors.              Core Components/Risk  Factors/Patient Goals at Discharge (Final Review):   Goals and Risk Factor Review - 06/12/20 1339      Core Components/Risk Factors/Patient Goals Review   Personal Goals Review Weight Management/Obesity;Tobacco Cessation;Diabetes;Hypertension;Lipids    Review Kadance is doing well in rehab.  Her weight has been stable but her mood medication she is currently on causes her to get hangry, gain weight, and increase lipids.  She is going to pyschiatrist on Tuesday and plans to talk with them about chaning medicaiton to something more heart friendly. She is doing well off her cigarettes.  She has been switching things out for cigarette and finding that she can switch these out and trick her brain.  She is doing well with her blood pressures and she does check them at home.  She does not that if she sees higher numbers it can set off her anxiety.  So we talked about how auto cuffs can tend to read higher and not to let it get to her too much.  Deionna's sugars have also been running higher and she is also hoping this may be related to her meds too.    Expected Outcomes Short: Talk to doctor about meds Long: Continue to manage risk factors.           ITP Comments:  ITP Comments    Row Name 05/05/20 0936 05/11/20 1645 05/11/20 1646 05/17/20 1654 05/31/20 0610   ITP Comments Initial telephone orientation completed. Diagnosis can be found in [facility] 9/8.EP orientation scheduled for 10/14 at 2pm Completed and gym orientation. Initial ITP created and sent for review to Dr. Bethann Punches, Medical Director. Renise is a current tobacco user. Intervention for tobacco cessation was provided at the initial medical review. She was asked about readiness to quit. Channa stated she had quit before but started up again. She is up to 5 cigarettes/day and is ready to quit again. We provided her with a fake cigarette to help cope with the oral fixation. Patient was advised and educated about tobacco cessation using combination  therapy, tobacco cessation classes, quit line, and quit smoking apps. Patient demonstrated understanding of this material. Staff will continue to provide encouragement and follow up with the patient throughout the program. First full day of exercise!  Patient was oriented to gym and equipment including functions, settings, policies, and procedures.  Patient's individual exercise prescription and treatment plan were reviewed.  All starting workloads were established based on the results of the 6 minute walk test done at initial orientation visit.  The plan for exercise progression was also introduced and progression will be customized based on patient's performance and goals. 30 Day review completed. Medical Director ITP review done, changes made as directed, and signed approval by Medical Director.   Row Name 06/01/20 1344 06/28/20 0910 06/28/20 1306 07/05/20 1414 07/12/20 1537   ITP Comments Patient called and reported she had a fever and is getting COVID tested. Will cancel [facility] appointments until we hear on results. Patient agreed. 30 Day review completed. Medical Director ITP review done, changes made as directed, and signed approval by Medical Director.  HAs not attended since 11/15 Maiah has not attended since last review Called pt to check in.  Has not been here since grandmother passed.  Left message. Called to check in on Mykeria as she hasn't been here since 06/12/20. We have not heard from her since her grandmas passing the week of Thanksgiving. Unfortunately, we will have to discharge her in 2 weeks if we  do not hear back from her. Sent letter just in case she is not receiving our calls.   Row Name 07/25/20 1245 07/26/20 0526         ITP Comments Irving Burtonmily has not attended since 06/12/20. 30 Day review completed. Medical Director ITP review done, changes made as directed, and signed approval by Medical Director.             Comments: discharge ITP

## 2020-07-27 NOTE — Progress Notes (Signed)
Discharge Progress Report  Patient Details  Name: Alicia Phillips MRN: [id number] Date of Birth: 06-01-1981 Referring Provider:     Number of Visits: 10  Reason for Discharge:  Early Exit:  Lack of attendance   Diagnosis:  NSTEMI (non-ST elevation myocardial infarction) [facility])  Status post coronary artery stent placement  Initial Exercise Prescription:  Initial Exercise Prescription - 05/11/20 1600      Date of Initial Exercise RX and Referring Provider   Date 05/11/20      Treadmill   MPH 2.4    Grade 1    Minutes 15    METs 3.17      Recumbant Elliptical   Level 2    RPM 50    Minutes 15    METs 4.6      Elliptical   Level 1    Speed 2.1    Minutes 15    METs 4.6      REL-XR   Level 3    Speed 50    Minutes 15    METs 4.6      Prescription Details   Frequency (times per week) 3    Duration Progress to 30 minutes of continuous aerobic without signs/symptoms of physical distress      Intensity   THRR 40-80% of Max Heartrate 124-162    Ratings of Perceived Exertion 11-13    Perceived Dyspnea 0-4      Progression   Progression Continue to progress workloads to maintain intensity without signs/symptoms of physical distress.      Resistance Training   Training Prescription Yes           Discharge Exercise Prescription (Final Exercise Prescription Changes):  Exercise Prescription Changes - 06/14/20 1300      Response to Exercise   Blood Pressure (Admit) 114/70    Blood Pressure (Exercise) 140/82    Blood Pressure (Exit) 122/62    Heart Rate (Admit) 85 bpm    Heart Rate (Exercise) 123 bpm    Heart Rate (Exit) 97 bpm    Rating of Perceived Exertion (Exercise) 15    Symptoms none    Duration Continue with 30 min of aerobic exercise without signs/symptoms of physical distress.    Intensity THRR unchanged      Progression   Progression Continue to progress workloads to maintain intensity without signs/symptoms of physical distress.    Average  METs 3.7      Resistance Training   Training Prescription Yes    Weight 5 lb    Reps 10-15      Interval Training   Interval Training No      Treadmill   MPH 2.7    Grade 1    Minutes 15    METs 3.44      Elliptical   Level 1           Functional Capacity:  6 Minute Walk    Row Name 05/11/20 1614         6 Minute Walk   Phase Initial     Distance 1287 feet     Walk Time 6 minutes     # of Rest Breaks 0     MPH 2.43     METS 4.6     RPE 8     Perceived Dyspnea  0     VO2 Peak 16.1     Symptoms No     Resting HR 86 bpm     Resting BP  116/78     Resting Oxygen Saturation  99 %     Exercise Oxygen Saturation  during 6 min walk 98 %     Max Ex. HR 97 bpm     Max Ex. BP 122/74     2 Minute Post BP 118/76            Psychological, QOL, Others - Outcomes: PHQ 2/9: Depression screen PHQ 2/9 05/11/2020  Decreased Interest 1  Down, Depressed, Hopeless 2  PHQ - 2 Score 3  Altered sleeping 2  Tired, decreased energy 1  Change in appetite 1  Feeling bad or failure about yourself  0  Trouble concentrating 0  Moving slowly or fidgety/restless 0  Suicidal thoughts 0  PHQ-9 Score 7  Difficult doing work/chores Somewhat difficult  Some encounter information is confidential and restricted. Go to Review Flowsheets activity to see all data.    Goals reviewed with patient; copy given to patient.

## 2020-07-27 NOTE — Progress Notes (Signed)
Cardiac Individual Treatment Plan  Patient Details  Name: Alicia Phillips MRN: [id number] Date of Birth: [birth year] Referring Provider:    Initial Encounter Date:  Flowsheet Row Cardiac Rehab from 05/11/2020 in [facility] Cardiac and Pulmonary Rehab  Date 05/11/20      Visit Diagnosis: NSTEMI (non-ST elevation myocardial infarction) [facility])  Status post coronary artery stent placement  Patient's Home Medications on Admission:  Current Outpatient Medications:  .  ACETAMINOPHEN EXTRA STRENGTH 500 MG tablet, Take 500 mg by mouth every 4 (four) hours as needed for mild pain or headache. , Disp: , Rfl:  .  ARIPiprazole (ABILIFY) 10 MG tablet, Take 1 tablet (10 mg total) by mouth daily., Disp: 30 tablet, Rfl: 2 .  ARIPiprazole (ABILIFY) 5 MG tablet, Take 1 tablet (5 mg total) by mouth daily., Disp: 30 tablet, Rfl: 0 .  aspirin 81 MG chewable tablet, Chew 1 tablet (81 mg total) by mouth daily., Disp: , Rfl:  .  atorvastatin (LIPITOR) 80 MG tablet, Take 1 tablet (80 mg total) by mouth daily., Disp: 90 tablet, Rfl: 3 .  Continuous Blood Gluc Sensor (FREESTYLE LIBRE 14 DAY SENSOR) MISC, Apply topically as directed., Disp: , Rfl:  .  doxycycline (VIBRAMYCIN) 50 MG capsule, Take 1 capsule (50 mg total) by mouth daily., Disp: 30 capsule, Rfl: 0 .  INPEN 100-PINK-NOVO DEVI, 3 (three) times daily., Disp: , Rfl:  .  insulin aspart (NOVOLOG) 100 UNIT/ML injection, Inject 0-20 Units into the skin 3 (three) times daily before meals. Per sliding scale, Disp: , Rfl:  .  lamoTRIgine (LAMICTAL) 100 MG tablet, Take 1 tablet (100 mg total) by mouth at bedtime. Take one tablet at bedtime., Disp: 30 tablet, Rfl: 1 .  metoprolol succinate (TOPROL XL) 25 MG 24 hr tablet, Take 1 tablet (25 mg total) by mouth in the morning and at bedtime., Disp: 180 tablet, Rfl: 3 .  nitroGLYCERIN (NITROSTAT) 0.4 MG SL tablet, Place 1 tablet (0.4 mg total) under the tongue every 5 (five) minutes as needed for chest pain., Disp: 25 tablet,  Rfl: 2 .  NOVOFINE PEN NEEDLE 32G X 6 MM MISC, SMARTSIG:Injection 5 Times Daily, Disp: , Rfl:  .  ticagrelor (BRILINTA) 90 MG TABS tablet, Take 1 tablet (90 mg total) by mouth 2 (two) times daily., Disp: 60 tablet, Rfl: 11 .  TRESIBA FLEXTOUCH 100 UNIT/ML FlexTouch Pen, Inject 25 Units into the skin daily. , Disp: , Rfl:   Current Facility-Administered Medications:  .  iron polysaccharides (NIFEREX) capsule 150 mg, 150 mg, Oral, Daily, [facility], [place], [place]  Past Medical History: Past Medical History:  Diagnosis Date  . Anxiety   . Bipolar disorder (HCC)   . CAD (coronary artery disease)    a. LHC 04/05/20: 95% stenosis of mid LAD s/p DES, 30% stenosis of proximal to mid LAD  . Diabetes mellitus type 1 (HCC)   . History of borderline personality disorder   . Hyperlipidemia   . Major depression   . STEMI (ST elevation myocardial infarction) (HCC) 04/05/2020  . Tobacco use     Tobacco Use: Social History   Tobacco Use  Smoking Status Former Smoker  . Types: Cigarettes  Smokeless Tobacco Never Used  Tobacco Comment   patient stated she was smoking but has quit since 05/30/20    Labs: Recent Review Flowsheet Data    Labs for ITP Cardiac and Pulmonary Rehab Latest Ref Rng & Units 04/06/2020   Cholestrol 0 - 200 mg/dL 588   LDLCALC 0 -  99 mg/dL 161(W124(H)   HDL >96>40 mg/dL 50   Trlycerides <045<150 mg/dL 96   Hemoglobin [id number] 4.8 - 5.6 % 8.6(H)       Exercise Target Goals: Exercise Program Goal: Individual exercise prescription set using results from initial 6 min walk test and THRR while considering  patient's activity barriers and safety.   Exercise Prescription Goal: Initial exercise prescription builds to 30-45 minutes a day of aerobic activity, 2-3 days per week.  Home exercise guidelines will be given to patient during program as part of exercise prescription that the participant will acknowledge.   Education: Aerobic Exercise: - Group verbal and visual presentation on the  components of exercise prescription. Introduces F.I.T.T principle from ACSM for exercise prescriptions.  Reviews F.I.T.T. principles of aerobic exercise including progression. Written material given at graduation. Flowsheet Row Card[facility]  Date 05/24/20  [resistance 10/27/201]  Educator [facility]  Instruction Review Code 1- [employer]ibbVerbalizes Understanding      Education: Resistance Exercise: - Group verbal and visual presentation on the components of exercise prescription. Introduces F.I.T.T principle from ACSM for exercise prescriptions  Reviews F.I.T.T. principles of resistance exercise including progression. Written material given at graduation.    Education: Exercise & Equipment Safety: - Individual verbal instruction and demonstration of equipment use and safety with use of the equipment. Flowsheet Row [facility]  Date 05/11/20  Educator [place]  Instruction Review Code 1- Verbalizes Understanding      Education: Exercise Physiology & General Exercise Guidelines: - Group verbal and written instruction with models to review the exercise physiology of the cardiovascular system and associated critical values. Provides general exercise guidelines with specific guidelines to those with heart or lung disease.    Education: Flexibility, Balance, Mind/Body Relaxation: - Group verbal and visual presentation with interactive activity on the components of exercise prescription. Introduces F.I.T.T principle from ACSM for exercise prescriptions. Reviews F.I.T.T. principles of flexibility and balance exercise training including progression. Also discusses the mind body connection.  Reviews various relaxation techniques to help reduce and manage stress (i.e. Deep breathing, progressive muscle relaxation, and visualization). Balance handout provided to take home. Written material given at  graduation.   Activity Barriers & Risk Stratification:  Activity Barriers & Cardiac Risk Stratification - 05/11/20 1654      Activity Barriers & Cardiac Risk Stratification   Activity Barriers None    Cardiac Risk Stratification High           6 Minute Walk:  6 Minute Walk    Row Name 05/11/20 1614         6 Minute Walk   Phase Initial     Distance 1287 feet     Walk Time 6 minutes     # of Rest Breaks 0     MPH 2.43     METS 4.6     RPE 8     Perceived Dyspnea  0     VO2 Peak 16.1     Symptoms No     Resting HR 86 bpm     Resting BP 116/78     Resting Oxygen Saturation  99 %     Exercise Oxygen Saturation  during 6 min walk 98 %     Max Ex. HR 97 bpm     Max Ex. BP 122/74     2 Minute Post BP 118/76  Oxygen Initial Assessment:   Oxygen Re-Evaluation:   Oxygen Discharge (Final Oxygen Re-Evaluation):   Initial Exercise Prescription:  Initial Exercise Prescription - 05/11/20 1600      Date of Initial Exercise RX and Referring Provider   Date 05/11/20      Treadmill   MPH 2.4    Grade 1    Minutes 15    METs 3.17      Recumbant Elliptical   Level 2    RPM 50    Minutes 15    METs 4.6      Elliptical   Level 1    Speed 2.1    Minutes 15    METs 4.6      REL-XR   Level 3    Speed 50    Minutes 15    METs 4.6      Prescription Details   Frequency (times per week) 3    Duration Progress to 30 minutes of continuous aerobic without signs/symptoms of physical distress      Intensity   THRR 40-80% of Max Heartrate 124-162    Ratings of Perceived Exertion 11-13    Perceived Dyspnea 0-4      Progression   Progression Continue to progress workloads to maintain intensity without signs/symptoms of physical distress.      Resistance Training   Training Prescription Yes           Perform Capillary Blood Glucose checks as needed.  Exercise Prescription Changes:  Exercise Prescription Changes    Row Name 05/11/20 1600  05/31/20 1400 06/14/20 1300         Response to Exercise   Blood Pressure (Admit) 116/78 98/52 114/70     Blood Pressure (Exercise) 122/74 134/64 140/82     Blood Pressure (Exit) 118/76 124/64 122/62     Heart Rate (Admit) 86 bpm 97 bpm 85 bpm     Heart Rate (Exercise) 97 bpm 130 bpm 123 bpm     Heart Rate (Exit) 88 bpm 98 bpm 97 bpm     Oxygen Saturation (Admit) 99 % -- --     Oxygen Saturation (Exercise) 98 % -- --     Oxygen Saturation (Exit) 98 % -- --     Rating of Perceived Exertion (Exercise) Perceived Dyspnea (Exercise) 0 -- --     Symptoms none none none     Comments walk test results -- --     Duration Progress to 30 minutes of  aerobic without signs/symptoms of physical distress Continue with 30 min of aerobic exercise without signs/symptoms of physical distress. Continue with 30 min of aerobic exercise without signs/symptoms of physical distress.     Intensity -- THRR unchanged THRR unchanged           Progression   Progression -- Continue to progress workloads to maintain intensity without signs/symptoms of physical distress. Continue to progress workloads to maintain intensity without signs/symptoms of physical distress.     Average METs -- 3.27 3.7           Resistance Training   Training Prescription -- Yes Yes     Weight 4 lb 4 lb 5 lb     Reps 10-15 10-15 10-15           Interval Training   Interval Training -- No No           Treadmill   MPH -- 3 2.7  Grade -- 1 1     Minutes -- 15 15     METs -- 3.71 3.44           Elliptical   Level -- 1 1     Speed -- 2.1 --     Minutes -- 4 --     METs -- 2 --           REL-XR   Level -- 4 --     Minutes -- 15 --     METs -- 3.6 --            Exercise Comments:   Exercise Goals and Review:  Exercise Goals    Row Name 05/11/20 1659             Exercise Goals   Increase Physical Activity Yes       Intervention Provide advice, education, support and counseling about physical  activity/exercise needs.;Develop an individualized exercise prescription for aerobic and resistive training based on initial evaluation findings, risk stratification, comorbidities and participant's personal goals.       Expected Outcomes Short Term: Attend rehab on a regular basis to increase amount of physical activity.;Long Term: Exercising regularly at least 3-5 days a week.;Long Term: Add in home exercise to make exercise part of routine and to increase amount of physical activity.       Increase Strength and Stamina Yes       Intervention Provide advice, education, support and counseling about physical activity/exercise needs.;Develop an individualized exercise prescription for aerobic and resistive training based on initial evaluation findings, risk stratification, comorbidities and participant's personal goals.       Expected Outcomes Short Term: Increase workloads from initial exercise prescription for resistance, speed, and METs.;Short Term: Perform resistance training exercises routinely during rehab and add in resistance training at home;Long Term: Improve cardiorespiratory fitness, muscular endurance and strength as measured by increased METs and functional capacity ( )       Able to understand and use rate of perceived exertion (RPE) scale Yes       Intervention Provide education and explanation on how to use RPE scale       Expected Outcomes Short Term: Able to use RPE daily in rehab to express subjective intensity level;Long Term:  Able to use RPE to guide intensity level when exercising independently       Able to understand and use Dyspnea scale Yes       Intervention Provide education and explanation on how to use Dyspnea scale       Expected Outcomes Short Term: Able to use Dyspnea scale daily in rehab to express subjective sense of shortness of breath during exertion;Long Term: Able to use Dyspnea scale to guide intensity level when exercising independently       Knowledge and  understanding of Target Heart Rate Range (THRR) Yes       Intervention Provide education and explanation of THRR including how the numbers were predicted and where they are located for reference       Expected Outcomes Short Term: Able to state/look up THRR;Long Term: Able to use THRR to govern intensity when exercising independently;Short Term: Able to use daily as guideline for intensity in rehab       Able to check pulse independently Yes       Intervention Provide education and demonstration on how to check pulse in carotid and radial arteries.;Review the importance of being able to check your own pulse for safety  during independent exercise       Expected Outcomes Short Term: Able to explain why pulse checking is important during independent exercise;Long Term: Able to check pulse independently and accurately       Understanding of Exercise Prescription Yes       Intervention Provide education, explanation, and written materials on patient's individual exercise prescription       Expected Outcomes Short Term: Able to explain program exercise prescription;Long Term: Able to explain home exercise prescription to exercise independently              Exercise Goals Re-Evaluation :  Exercise Goals Re-Evaluation    Row Name 05/17/20 1658 05/31/20 1429 06/12/20 1333 06/14/20 1318       Exercise Goal Re-Evaluation   Exercise Goals Review Increase Physical Activity;Able to understand and use rate of perceived exertion (RPE) scale;Knowledge and understanding of Target Heart Rate Range (THRR);Understanding of Exercise Prescription;Increase Strength and Stamina;Able to check pulse independently Increase Physical Activity;Increase Strength and Stamina;Understanding of Exercise Prescription Increase Physical Activity;Increase Strength and Stamina;Understanding of Exercise Prescription Increase Physical Activity;Increase Strength and Stamina;Understanding of Exercise Prescription    Comments Reviewed RPE  and dyspnea scales, THR and program prescription with pt today.  Pt voiced understanding and was given a copy of goals to take home. Alicia Phillips  is doing well in rehab.  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  She is up to 2 minutes on there at a time.  We will continue to monitor her progress. Alicia Phillips is doing well in rehab.  She is doing yoga on of her off days(5x/wk). She is also walking daily for about 20 min.  We talked about adding in more time to her walks at home. Alicia Phillips is doing well in rehab.  She is doing yoga on of her off days(5x/wk). She is also walking daily for about 20 min.  We talked about adding in more time to her walks at home.    Expected Outcomes Short: Use RPE daily to regulate intensity. Long: Follow program prescription in THR. Short: Continue to improve on elliptical Long: Continue to increase stamina. Short: Continue to add time to walks at home Long; Continue to exercise on off days. Short: Continue to add time to walks at home Long; Continue to exercise on off days.           Discharge Exercise Prescription (Final Exercise Prescription Changes):  Exercise Prescription Changes - 06/14/20 1300      Response to Exercise   Blood Pressure (Admit) 114/70    Blood Pressure (Exercise) 140/82    Blood Pressure (Exit) 122/62    Heart Rate (Admit) 85 bpm    Heart Rate (Exercise) 123 bpm    Heart Rate (Exit) 97 bpm    Rating of Perceived Exertion (Exercise) 15    Symptoms none    Duration Continue with 30 min of aerobic exercise without signs/symptoms of physical distress.    Intensity THRR unchanged      Progression   Progression Continue to progress workloads to maintain intensity without signs/symptoms of physical distress.    Average METs 3.7      Resistance Training   Training Prescription Yes    Weight 5 lb    Reps 10-15      Interval Training   Interval Training No      Treadmill   MPH 2.7    Grade 1    Minutes 15    METs 3.44  Elliptical   Level 1            Nutrition:  Target Goals: Understanding of nutrition guidelines, daily intake of sodium 1500mg , cholesterol 200mg , calories 30% from fat and 7% or less from saturated fats, daily to have 5 or more servings of fruits and vegetables.  Education: All About Nutrition: -Group instruction provided by verbal, written material, interactive activities, discussions, models, and posters to present general guidelines for heart healthy nutrition including fat, fiber, MyPlate, the role of sodium in heart healthy nutrition, utilization of the nutrition label, and utilization of this knowledge for meal planning. Follow up email sent as well. Written material given at graduation. Flowsheet Row Cardiac Rehab from 06/07/2020 in [facility] Cardiac and Pulmonary Rehab  Date 06/07/20  Educator [facility]  Instruction Review Code 1- Verbalizes Understanding      Biometrics:  Pre Biometrics - 05/11/20 1613      Pre Biometrics   Height 5' 7.5" (1.715 m)    Weight 165 lb (74.8 kg)    BMI (Calculated) 25.45    Single Leg Stand 30 seconds            Nutrition Therapy Plan and Nutrition Goals:   Nutrition Assessments:  Nutrition Assessments - 05/11/20 1604      MEDFICTS Scores   Pre Score 32          MEDIFICTS Score Key:  ?70 Need to make dietary changes   40-70 Heart Healthy Diet  ? 40 Therapeutic Level Cholesterol Diet   Picture Your Plate Scores:  <56 Unhealthy dietary pattern with much room for improvement.  41-50 Dietary pattern unlikely to meet recommendations for good health and room for improvement.  51-60 More healthful dietary pattern, with some room for improvement.   >60 Healthy dietary pattern, although there may be some specific behaviors that could be improved.    Nutrition Goals Re-Evaluation:  Nutrition Goals Re-Evaluation    Row Name 06/12/20 1339             Goals   Nutrition Goal Schedule to meet with Melissa and get questions answered       Comment Pt  wants to meet with dietitian to get questions answered       Expected Outcome Meet with dietitian              Nutrition Goals Discharge (Final Nutrition Goals Re-Evaluation):  Nutrition Goals Re-Evaluation - 06/12/20 1339      Goals   Nutrition Goal Schedule to meet with Melissa and get questions answered    Comment Pt wants to meet with dietitian to get questions answered    Expected Outcome Meet with dietitian           Psychosocial: Target Goals: Acknowledge presence or absence of significant depression and/or stress, maximize coping skills, provide positive support system. Participant is able to verbalize types and ability to use techniques and skills needed for reducing stress and depression.   Education: Stress, Anxiety, and Depression - Group verbal and visual presentation to define topics covered.  Reviews how body is impacted by stress, anxiety, and depression.  Also discusses healthy ways to reduce stress and to treat/manage anxiety and depression.  Written material given at graduation.   Education: Sleep Hygiene -Provides group verbal and written instruction about how sleep can affect your health.  Define sleep hygiene, discuss sleep cycles and impact of sleep habits. Review good sleep hygiene tips.    Initial Review & Psychosocial Screening:  Initial Psych  Review & Screening - 05/05/20 0944      Initial Review   Current issues with Current Anxiety/Panic;Current Stress Concerns;Current Sleep Concerns;History of Depression;Current Psychotropic Meds    Source of Stress Concerns --    Comments Post MI, history of anxiety/depression      Family Dynamics   Good Support System? Yes      Barriers   Psychosocial barriers to participate in program There are no identifiable barriers or psychosocial needs.;The patient should benefit from training in stress management and relaxation.      Screening Interventions   Interventions Encouraged to exercise;Provide feedback  about the scores to participant;To provide support and resources with identified psychosocial needs    Expected Outcomes Short Term goal: Utilizing psychosocial counselor, staff and physician to assist with identification of specific Stressors or current issues interfering with healing process. Setting desired goal for each stressor or current issue identified.;Long Term Goal: Stressors or current issues are controlled or eliminated.;Short Term goal: Identification and review with participant of any Quality of Life or Depression concerns found by scoring the questionnaire.;Long Term goal: The participant improves quality of Life and PHQ9 Scores as seen by post scores and/or verbalization of changes           Quality of Life Scores:   Quality of Life - 05/11/20 1603      Quality of Life   Select Quality of Life      Quality of Life Scores   Health/Function Pre 12.53 %    Socioeconomic Pre 20.5 %    Psych/Spiritual Pre 15 %    Family Pre 23 %    GLOBAL Pre 15.8 %          Scores of 19 and below usually indicate a poorer quality of life in these areas.  A difference of  2-3 points is a clinically meaningful difference.  A difference of 2-3 points in the total score of the Quality of Life Index has been associated with significant improvement in overall quality of life, self-image, physical symptoms, and general health in studies assessing change in quality of life.  PHQ-9: Recent Review Flowsheet Data    Depression screen [facility] 2/9 05/11/2020   Decreased Interest 1   Down, Depressed, Hopeless 2   PHQ - 2 Score 3   Altered sleeping 2   Tired, decreased energy 1   Change in appetite 1   Feeling bad or failure about yourself  0   Trouble concentrating 0   Moving slowly or fidgety/restless 0   Suicidal thoughts 0   PHQ-9 Score 7   Difficult doing work/chores Somewhat difficult     Interpretation of Total Score  Total Score Depression Severity:  1-4 = Minimal depression, 5-9 = Mild  depression, 10-14 = Moderate depression, 15-19 = Moderately severe depression, 20-27 = Severe depression   Psychosocial Evaluation and Intervention:  Psychosocial Evaluation - 05/05/20 1004      Psychosocial Evaluation & Interventions   Comments Alicia Phillips is working very hard post MI to get back into a good mental state. She had her MI while trying to relax, so she is currently taking anxiety meds to help sleep. She is seeing a therapist for anxiety, attending relaxation classes, and has been given an extension from her work to be out til December. She is really looking forward to coming to Cardiac Rehab so she can learn more about her health. She quit smoking after her heart attack is very motivated to stay away from smoking.  Her treatment for diabetes has had to go through recent changes in response to her fluctuating CBG readings.    Expected Outcomes Short: attend cardiac rehab for education and exercise. Long: maintain positive self care habits.    Continue Psychosocial Services  Follow up required by staff           Psychosocial Re-Evaluation:  Psychosocial Re-Evaluation    Row Name 06/12/20 1336             Psychosocial Re-Evaluation   Current issues with Current Anxiety/Panic;Current Stress Concerns       Comments Alicia Phillips is doing well in rehab.  She has "graduated" from her group therapy class and now see a therapist twice a week. She is really liking her and doing well overall.  She is sleeping well and sleeps til her alarm goes off.       Expected Outcomes SHort: Continue with new therapist  Long: Continue to manage anxiety.       Interventions Encouraged to attend Cardiac Rehabilitation for the exercise       Continue Psychosocial Services  Follow up required by staff              Psychosocial Discharge (Final Psychosocial Re-Evaluation):  Psychosocial Re-Evaluation - 06/12/20 1336      Psychosocial Re-Evaluation   Current issues with Current Anxiety/Panic;Current Stress  Concerns    Comments Alicia Phillips is doing well in rehab.  She has "graduated" from her group therapy class and now see a therapist twice a week. She is really liking her and doing well overall.  She is sleeping well and sleeps til her alarm goes off.    Expected Outcomes SHort: Continue with new therapist  Long: Continue to manage anxiety.    Interventions Encouraged to attend Cardiac Rehabilitation for the exercise    Continue Psychosocial Services  Follow up required by staff           Vocational Rehabilitation: Provide vocational rehab assistance to qualifying candidates.   Vocational Rehab Evaluation & Intervention:  Vocational Rehab - 05/05/20 0942      Initial Vocational Rehab Evaluation & Intervention   Assessment shows need for Vocational Rehabilitation No           Education: Education Goals: Education classes will be provided on a variety of topics geared toward better understanding of heart health and risk factor modification. Participant will state understanding/return demonstration of topics presented as noted by education test scores.  Learning Barriers/Preferences:  Learning Barriers/Preferences - 05/05/20 0942      Learning Barriers/Preferences   Learning Barriers None    Learning Preferences None           General Cardiac Education Topics:  AED/CPR: - Group verbal and written instruction with the use of models to demonstrate the basic use of the AED with the basic ABC's of resuscitation.   Anatomy and Cardiac Procedures: - Group verbal and visual presentation and models provide information about basic cardiac anatomy and function. Reviews the testing methods done to diagnose heart disease and the outcomes of the test results. Describes the treatment choices: Medical Management, Angioplasty, or Coronary Bypass Surgery for treating various heart conditions including Myocardial Infarction, Angina, Valve Disease, and Cardiac Arrhythmias.  Written material given at  graduation. Flowsheet Row Cardiac Rehab from 06/07/2020 in [facility] Cardiac and Pulmonary Rehab  Date 05/24/20  Educator [facility]  Instruction Review Code 1- Verbalizes Understanding      Medication Safety: - Group verbal and visual instruction to  review commonly prescribed medications for heart and lung disease. Reviews the medication, class of the drug, and side effects. Includes the steps to properly store meds and maintain the prescription regimen.  Written material given at graduation.   Intimacy: - Group verbal instruction through game format to discuss how heart and lung disease can affect sexual intimacy. Written material given at graduation.. Flowsheet Row Cardiac Rehab from 06/07/2020 in [facility] Cardiac and Pulmonary Rehab  Date 05/17/20  Educator AS  Instruction Review Code 1- Verbalizes Understanding      Know Your Numbers and Heart Failure: - Group verbal and visual instruction to discuss disease risk factors for cardiac and pulmonary disease and treatment options.  Reviews associated critical values for Overweight/Obesity, Hypertension, Cholesterol, and Diabetes.  Discusses basics of heart failure: signs/symptoms and treatments.  Introduces Heart Failure Zone chart for action plan for heart failure.  Written material given at graduation.   Infection Prevention: - Provides verbal and written material to individual with discussion of infection control including proper hand washing and proper equipment cleaning during exercise session. Flowsheet Row Cardiac Rehab from 06/07/2020 in [facility] Cardiac and Pulmonary Rehab  Date 05/11/20  Educator KL  Instruction Review Code 1- Verbalizes Understanding      Falls Prevention: - Provides verbal and written material to individual with discussion of falls prevention and safety. Flowsheet Row Cardiac Rehab from 06/07/2020 in [facility] Cardiac and Pulmonary Rehab  Date 05/11/20  Educator KL  Instruction Review Code 1- Verbalizes Understanding       Other: -Provides group and verbal instruction on various topics (see comments)   Knowledge Questionnaire Score:  Knowledge Questionnaire Score - 05/11/20 1702      Knowledge Questionnaire Score   Pre Score 26/26           Core Components/Risk Factors/Patient Goals at Admission:  Personal Goals and Risk Factors at Admission - 05/11/20 1700      Core Components/Risk Factors/Patient Goals on Admission    Weight Management Yes;Weight Maintenance    Intervention Weight Management/Obesity: Establish reasonable short term and long term weight goals.;Weight Management: Provide education and appropriate resources to help participant work on and attain dietary goals.;Weight Management: Develop a combined nutrition and exercise program designed to reach desired caloric intake, while maintaining appropriate intake of nutrient and fiber, sodium and fats, and appropriate energy expenditure required for the weight goal.    Admit Weight 165 lb (74.8 kg)    Goal Weight: Short Term 165 lb (74.8 kg)    Goal Weight: Long Term 165 lb (74.8 kg)    Expected Outcomes Short Term: Continue to assess and modify interventions until short term weight is achieved;Weight Maintenance: Understanding of the daily nutrition guidelines, which includes 25-35% calories from fat, 7% or less cal from saturated fats, less than 200mg  cholesterol, less than 1.5gm of sodium, & 5 or more servings of fruits and vegetables daily;Understanding recommendations for meals to include 15-35% energy as protein, 25-35% energy from fat, 35-60% energy from carbohydrates, less than 200mg  of dietary cholesterol, 20-35 gm of total fiber daily;Understanding of distribution of calorie intake throughout the day with the consumption of 4-5 meals/snacks    Tobacco Cessation Yes    Intervention Offer self-teaching materials, assist with locating and accessing local/national Quit Smoking programs, and support quit date choice.;Assist the participant  in steps to quit. Provide individualized education and counseling about committing to Tobacco Cessation, relapse prevention, and pharmacological support that can be provided by physician.    Expected Outcomes Short  Term: Will demonstrate readiness to quit, by selecting a quit date.;Long Term: Complete abstinence from all tobacco products for at least 12 months from quit date.;Short Term: Will quit all tobacco product use, adhering to prevention of relapse plan.    Diabetes Yes    Intervention Provide education about signs/symptoms and action to take for hypo/hyperglycemia.;Provide education about proper nutrition, including hydration, and aerobic/resistive exercise prescription along with prescribed medications to achieve blood glucose in normal ranges: Fasting glucose 65-99 mg/dL    Expected Outcomes Short Term: Participant verbalizes understanding of the signs/symptoms and immediate care of hyper/hypoglycemia, proper foot care and importance of medication, aerobic/resistive exercise and nutrition plan for blood glucose control.;Long Term: Attainment of HbA1C < 7%.    Hypertension Yes    Intervention Provide education on lifestyle modifcations including regular physical activity/exercise, weight management, moderate sodium restriction and increased consumption of fresh fruit, vegetables, and low fat dairy, alcohol moderation, and smoking cessation.;Monitor prescription use compliance.    Expected Outcomes Short Term: Continued assessment and intervention until BP is < 140/66mm HG in hypertensive participants. < 130/33mm HG in hypertensive participants with diabetes, heart failure or chronic kidney disease.;Long Term: Maintenance of blood pressure at goal levels.    Lipids Yes    Intervention Provide education and support for participant on nutrition & aerobic/resistive exercise along with prescribed medications to achieve LDL 70mg , HDL >40mg .    Expected Outcomes Short Term: Participant states  understanding of desired cholesterol values and is compliant with medications prescribed. Participant is following exercise prescription and nutrition guidelines.;Long Term: Cholesterol controlled with medications as prescribed, with individualized exercise RX and with personalized nutrition plan. Value goals: LDL < , HDL > 40 mg.           Education:Diabetes - Individual verbal and written instruction to review signs/symptoms of diabetes, desired ranges of glucose level fasting, after meals and with exercise. Acknowledge that pre and post exercise glucose checks will be done for 3 sessions at entry of program. Flowsheet Row Cardiac Rehab from 05/05/2020 in [facility] Cardiac and Pulmonary Rehab  Date 05/05/20  Educator [facility]  Instruction Review Code 1- Verbalizes Understanding      Core Components/Risk Factors/Patient Goals Review:   Goals and Risk Factor Review    Row Name 06/12/20 1339             Core Components/Risk Factors/Patient Goals Review   Personal Goals Review Weight Management/Obesity;Tobacco Cessation;Diabetes;Hypertension;Lipids       Review Alicia Phillips is doing well in rehab.  Her weight has been stable but her mood medication she is currently on causes her to get hangry, gain weight, and increase lipids.  She is going to pyschiatrist on Tuesday and plans to talk with them about chaning medicaiton to something more heart friendly. She is doing well off her cigarettes.  She has been switching things out for cigarette and finding that she can switch these out and trick her brain.  She is doing well with her blood pressures and she does check them at home.  She does not that if she sees higher numbers it can set off her anxiety.  So we talked about how auto cuffs can tend to read higher and not to let it get to her too much.  Alicia Phillips's sugars have also been running higher and she is also hoping this may be related to her meds too.       Expected Outcomes Short: Talk to doctor about meds  Long: Continue to manage risk factors.  Core Components/Risk Factors/Patient Goals at Discharge (Final Review):   Goals and Risk Factor Review - 06/12/20 1339      Core Components/Risk Factors/Patient Goals Review   Personal Goals Review Weight Management/Obesity;Tobacco Cessation;Diabetes;Hypertension;Lipids    Review Alicia Phillips is doing well in rehab.  Her weight has been stable but her mood medication she is currently on causes her to get hangry, gain weight, and increase lipids.  She is going to pyschiatrist on Tuesday and plans to talk with them about chaning medicaiton to something more heart friendly. She is doing well off her cigarettes.  She has been switching things out for cigarette and finding that she can switch these out and trick her brain.  She is doing well with her blood pressures and she does check them at home.  She does not that if she sees higher numbers it can set off her anxiety.  So we talked about how auto cuffs can tend to read higher and not to let it get to her too much.  Alicia Phillips's sugars have also been running higher and she is also hoping this may be related to her meds too.    Expected Outcomes Short: Talk to doctor about meds Long: Continue to manage risk factors.           ITP Comments:  ITP Comments    Row Name 05/05/20 0936 05/11/20 1645 05/11/20 1646 05/17/20 1654 05/31/20 0610   ITP Comments Initial telephone orientation completed. Diagnosis can be found in [facility] 9/8.EP orientation scheduled for 10/14 at 2pm Completed and gym orientation. Initial ITP created and sent for review to Dr. Bethann Punches, Medical Director. Tassie is a current tobacco user. Intervention for tobacco cessation was provided at the initial medical review. She was asked about readiness to quit. Quiana stated she had quit before but started up again. She is up to 5 cigarettes/day and is ready to quit again. We provided her with a fake cigarette to help cope with the oral fixation.  Patient was advised and educated about tobacco cessation using combination therapy, tobacco cessation classes, quit line, and quit smoking apps. Patient demonstrated understanding of this material. Staff will continue to provide encouragement and follow up with the patient throughout the program. First full day of exercise!  Patient was oriented to gym and equipment including functions, settings, policies, and procedures.  Patient's individual exercise prescription and treatment plan were reviewed.  All starting workloads were established based on the results of the 6 minute walk test done at initial orientation visit.  The plan for exercise progression was also introduced and progression will be customized based on patient's performance and goals. 30 Day review completed. Medical Director ITP review done, changes made as directed, and signed approval by Medical Director.   Row Name 06/01/20 1344 06/28/20 0910 06/28/20 1306 07/05/20 1414 07/12/20 1537   ITP Comments Patient called and reported she had a fever and is getting COVID tested. Will cancel [facility] appointments until we hear on results. Patient agreed. 30 Day review completed. Medical Director ITP review done, changes made as directed, and signed approval by Medical Director.  HAs not attended since 11/15 Alicia Phillips has not attended since last review Called pt to check in.  Has not been here since grandmother passed.  Left message. Called to check in on Alicia Phillips as she hasn't been here since 06/12/20. We have not heard from her since her grandmas passing the week of Thanksgiving. Unfortunately, we will have to discharge her in 2 weeks  if we do not hear back from her. Sent letter just in case she is not receiving our calls.   Row Name 07/25/20 1245 07/26/20 0526 07/27/20 0734       ITP Comments Alicia Phillips has not attended since 06/12/20. 30 Day review completed. Medical Director ITP review done, changes made as directed, and signed approval by Medical Director.  Discharging  due to patient has not attended or returned phone calls.            Comments: discharge ITP

## 2020-07-31 ENCOUNTER — Other Ambulatory Visit (HOSPITAL_COMMUNITY): Payer: Self-pay | Admitting: Family

## 2020-08-01 DIAGNOSIS — F4311 Post-traumatic stress disorder, acute: Secondary | ICD-10-CM | POA: Diagnosis not present

## 2020-08-01 DIAGNOSIS — F330 Major depressive disorder, recurrent, mild: Secondary | ICD-10-CM | POA: Diagnosis not present

## 2020-08-01 DIAGNOSIS — F410 Panic disorder [episodic paroxysmal anxiety] without agoraphobia: Secondary | ICD-10-CM | POA: Diagnosis not present

## 2020-08-01 DIAGNOSIS — F4010 Social phobia, unspecified: Secondary | ICD-10-CM | POA: Diagnosis not present

## 2020-08-09 DIAGNOSIS — E782 Mixed hyperlipidemia: Secondary | ICD-10-CM | POA: Diagnosis not present

## 2020-08-09 LAB — HEPATIC FUNCTION PANEL
ALT: 19 IU/L (ref 0–32)
AST: 13 IU/L (ref 0–40)
Albumin: 4.1 g/dL (ref 3.8–4.8)
Alkaline Phosphatase: 115 IU/L (ref 44–121)
Bilirubin Total: 0.5 mg/dL (ref 0.0–1.2)
Bilirubin, Direct: 0.18 mg/dL (ref 0.00–0.40)
Total Protein: 6.8 g/dL (ref 6.0–8.5)

## 2020-08-09 LAB — LIPID PANEL
Chol/HDL Ratio: 2.3 ratio (ref 0.0–4.4)
Cholesterol, Total: 123 mg/dL (ref 100–199)
HDL: 53 mg/dL (ref >39)
LDL Chol Calc (NIH): 53 mg/dL (ref 0–99)
Triglycerides: 87 mg/dL (ref 0–149)
VLDL Cholesterol Cal: 17 mg/dL (ref 5–40)

## 2020-08-10 DIAGNOSIS — E1059 Type 1 diabetes mellitus with other circulatory complications: Secondary | ICD-10-CM | POA: Diagnosis not present

## 2020-08-10 DIAGNOSIS — F410 Panic disorder [episodic paroxysmal anxiety] without agoraphobia: Secondary | ICD-10-CM | POA: Diagnosis not present

## 2020-08-10 DIAGNOSIS — F39 Unspecified mood [affective] disorder: Secondary | ICD-10-CM | POA: Diagnosis not present

## 2020-08-11 ENCOUNTER — Ambulatory Visit: Payer: BC Managed Care – PPO | Admitting: Cardiovascular Disease

## 2020-08-11 ENCOUNTER — Encounter: Payer: Self-pay | Admitting: Cardiovascular Disease

## 2020-08-11 ENCOUNTER — Other Ambulatory Visit: Payer: Self-pay

## 2020-08-11 VITALS — BP 112/78 | HR 78 | Ht 68.0 in | Wt 165.0 lb

## 2020-08-11 DIAGNOSIS — I2102 ST elevation (STEMI) myocardial infarction involving left anterior descending coronary artery: Secondary | ICD-10-CM | POA: Diagnosis not present

## 2020-08-11 DIAGNOSIS — I25118 Atherosclerotic heart disease of native coronary artery with other forms of angina pectoris: Secondary | ICD-10-CM | POA: Diagnosis not present

## 2020-08-11 DIAGNOSIS — E782 Mixed hyperlipidemia: Secondary | ICD-10-CM | POA: Diagnosis not present

## 2020-08-11 DIAGNOSIS — Z720 Tobacco use: Secondary | ICD-10-CM | POA: Diagnosis not present

## 2020-08-11 NOTE — Progress Notes (Signed)
08/11/2020 Alicia Phillips   11-21-1980  [id number]  Primary Physician Aliene Beams, MD Primary Cardiologist: Runell Gess MD FACP, [place], [place], [place]  HPI:  Alicia Phillips is a 40 y.o.   mildly overweight Caucasian female with a history of type 1 diabetes since 1986 tobacco abuse who presented on 05/05/2020 with an anterior non-STEMI.  She underwent diagnostic coronary angiography the same day by Dr. Kirke Corin found a 95% mid LAD lesion that was stented with a 2.25 mm x 18 mm long resolute Onyx drug-eluting stent.  She had no other significant CAD.  LV function was normal.  Since that time she has stopped smoking.  She did have elevated lipids during that admit admission but was discharged home on high-dose statin therapy in addition to dual antiplatelet therapy including low-dose aspirin and Brilinta twice daily.  She was seen in the ER on 05/02/2020 with palpitations which she thought was PVCs and were similar to her presenting symptoms although there was no objective evidence of this and she was sent home on double the beta-blocker dose.  She has felt well since.  Since I saw her in the office 3 months ago she continues to do well.  She has an occasional PVC but this is rare.  Her lipid profile came back in the appropriate range on 08/09/2020 with an LDL of 53 on high-dose statin therapy.  She remains on dual antiplatelet therapy.  She is otherwise asymptomatic.   Current Meds  Medication Sig  . ACETAMINOPHEN EXTRA STRENGTH 500 MG tablet Take 500 mg by mouth every 4 (four) hours as needed for mild pain or headache.   Marland Kitchen aspirin 81 MG chewable tablet Chew 1 tablet (81 mg total) by mouth daily.  Marland Kitchen atorvastatin (LIPITOR) 80 MG tablet Take 1 tablet (80 mg total) by mouth daily.  . Continuous Blood Gluc Sensor (FREESTYLE LIBRE 14 DAY SENSOR) MISC Apply topically as directed.  Marland Kitchen FLUoxetine (PROZAC) 20 MG capsule Take 20 mg by mouth daily.  Morton Amy 100-PINK-NOVO DEVI 3 (three) times daily.  .  insulin aspart (NOVOLOG) 100 UNIT/ML injection Inject 0-20 Units into the skin 3 (three) times daily before meals. Per sliding scale  . lamoTRIgine (LAMICTAL) 100 MG tablet Take 1 tablet (100 mg total) by mouth at bedtime. Take one tablet at bedtime.  . metoprolol succinate (TOPROL XL) 25 MG 24 hr tablet Take 1 tablet (25 mg total) by mouth in the morning and at bedtime.  Marland Kitchen METOPROLOL TARTRATE PO Take 1 tablet by mouth in the morning and at bedtime.  . nitroGLYCERIN (NITROSTAT) 0.4 MG SL tablet Place 1 tablet (0.4 mg total) under the tongue every 5 (five) minutes as needed for chest pain.  Marland Kitchen NOVOFINE PEN NEEDLE 32G X 6 MM MISC SMARTSIG:Injection 5 Times Daily  . ticagrelor (BRILINTA) 90 MG TABS tablet Take 1 tablet (90 mg total) by mouth 2 (two) times daily.  Evaristo Bury FLEXTOUCH 100 UNIT/ML FlexTouch Pen Inject 25 Units into the skin daily.   . [DISCONTINUED] ARIPiprazole (ABILIFY) 10 MG tablet Take 1 tablet (10 mg total) by mouth daily.  . [DISCONTINUED] ARIPiprazole (ABILIFY) 5 MG tablet Take 1 tablet (5 mg total) by mouth daily.  . [DISCONTINUED] doxycycline (VIBRAMYCIN) 50 MG capsule Take 1 capsule (50 mg total) by mouth daily.     Allergies  Allergen Reactions  . Sulfamethoxazole-Trimethoprim Rash    Social History   Socioeconomic History  . Marital status: Single    Spouse name:  Not on file  . Number of children: Not on file  . Years of education: Not on file  . Highest education level: Not on file  Occupational History  . Not on file  Tobacco Use  . Smoking status: Former Smoker    Types: Cigarettes  . Smokeless tobacco: Never Used  . Tobacco comment: patient stated she was smoking but has quit since 05/30/20  Vaping Use  . Vaping Use: Never used  Substance and Sexual Activity  . Alcohol use: Never  . Drug use: Never  . Sexual activity: Not on file  Other Topics Concern  . Not on file  Social History Narrative  . Not on file   Social Determinants of Health    Financial Resource Strain: Not on file  Food Insecurity: Not on file  Transportation Needs: Not on file  Physical Activity: Not on file  Stress: Not on file  Social Connections: Not on file  Intimate Partner Violence: Not on file     Review of Systems: General: negative for chills, fever, night sweats or weight changes.  Cardiovascular: negative for chest pain, dyspnea on exertion, edema, orthopnea, palpitations, paroxysmal nocturnal dyspnea or shortness of breath Dermatological: negative for rash Respiratory: negative for cough or wheezing Urologic: negative for hematuria Abdominal: negative for nausea, vomiting, diarrhea, bright red blood per rectum, melena, or hematemesis Neurologic: negative for visual changes, syncope, or dizziness All other systems reviewed and are otherwise negative except as noted above.    Blood pressure 112/78, pulse 78, height 5\' 8"  (1.727 m), weight 165 lb (74.8 kg).  General appearance: alert and no distress Neck: no adenopathy, no carotid bruit, no JVD, supple, symmetrical, trachea midline and thyroid not enlarged, symmetric, no tenderness/mass/nodules Lungs: clear to auscultation bilaterally Heart: regular rate and rhythm, S1, S2 normal, no murmur, click, rub or gallop Extremities: extremities normal, atraumatic, no cyanosis or edema Pulses: 2+ and symmetric Skin: Skin color, texture, turgor normal. No rashes or lesions Neurologic: Alert and oriented X 3, normal strength and tone. Normal symmetric reflexes. Normal coordination and gait  EKG sinus rhythm at 78 without ST or T wave changes.  I personally reviewed this EKG.  ASSESSMENT AND PLAN:   STEMI (ST elevation myocardial infarction) (HCC) History of anterior non-STEMI 04/05/2020.  She underwent diagnostic angiography by Dr. 06/05/2020 revealing a 95% mid LAD lesion that was stented with a 2.25 mm x 18 mm long resolute Onyx drug-eluting stent with excellent angiographic result.  She had no residual  CAD in her other vessels.  LV function was normal.  She continues on dual antiplatelet therapy including aspirin and Brilinta.  She denies chest pain or shortness of breath.  Tobacco abuse History of discontinue tobacco abuse of the time of her anterior non-STEMI 04/05/2020.  Hyperlipidemia History of hyperlipidemia on statin therapy with recent lipid profile performed 08/09/2020 revealing total cholesterol 123, LDL 53 and HDL of 53.      10/07/2020 MD FACP,FACC,FAHA, [facility] 08/11/2020 8:34 AM

## 2020-08-11 NOTE — Patient Instructions (Signed)

## 2020-08-11 NOTE — Assessment & Plan Note (Signed)
History of anterior non-STEMI 04/05/2020.  She underwent diagnostic angiography by Dr. Kirke Corin revealing a 95% mid LAD lesion that was stented with a 2.25 mm x 18 mm long resolute Onyx drug-eluting stent with excellent angiographic result.  She had no residual CAD in her other vessels.  LV function was normal.  She continues on dual antiplatelet therapy including aspirin and Brilinta.  She denies chest pain or shortness of breath.

## 2020-08-11 NOTE — Assessment & Plan Note (Signed)
History of discontinue tobacco abuse of the time of her anterior non-STEMI 04/05/2020.

## 2020-08-11 NOTE — Assessment & Plan Note (Signed)
History of hyperlipidemia on statin therapy with recent lipid profile performed 08/09/2020 revealing total cholesterol 123, LDL 53 and HDL of 53.

## 2020-08-22 DIAGNOSIS — F410 Panic disorder [episodic paroxysmal anxiety] without agoraphobia: Secondary | ICD-10-CM | POA: Diagnosis not present

## 2020-08-22 DIAGNOSIS — F330 Major depressive disorder, recurrent, mild: Secondary | ICD-10-CM | POA: Diagnosis not present

## 2020-08-22 DIAGNOSIS — F4010 Social phobia, unspecified: Secondary | ICD-10-CM | POA: Diagnosis not present

## 2020-08-22 DIAGNOSIS — F4311 Post-traumatic stress disorder, acute: Secondary | ICD-10-CM | POA: Diagnosis not present

## 2020-08-29 DIAGNOSIS — F330 Major depressive disorder, recurrent, mild: Secondary | ICD-10-CM | POA: Diagnosis not present

## 2020-08-29 DIAGNOSIS — F4010 Social phobia, unspecified: Secondary | ICD-10-CM | POA: Diagnosis not present

## 2020-08-29 DIAGNOSIS — F410 Panic disorder [episodic paroxysmal anxiety] without agoraphobia: Secondary | ICD-10-CM | POA: Diagnosis not present

## 2020-08-29 DIAGNOSIS — F4311 Post-traumatic stress disorder, acute: Secondary | ICD-10-CM | POA: Diagnosis not present

## 2020-09-05 ENCOUNTER — Other Ambulatory Visit (HOSPITAL_COMMUNITY)
Admission: RE | Admit: 2020-09-05 | Discharge: 2020-09-05 | Disposition: A | Discharge status: Home / Self Care | Payer: BC Managed Care – PPO | Source: Ambulatory Visit | Attending: Family Medicine | Admitting: Family Medicine

## 2020-09-05 ENCOUNTER — Other Ambulatory Visit: Payer: Self-pay | Admitting: Family Medicine

## 2020-09-05 DIAGNOSIS — Z124 Encounter for screening for malignant neoplasm of cervix: Secondary | ICD-10-CM | POA: Insufficient documentation

## 2020-09-05 DIAGNOSIS — F410 Panic disorder [episodic paroxysmal anxiety] without agoraphobia: Secondary | ICD-10-CM | POA: Diagnosis not present

## 2020-09-05 DIAGNOSIS — Z113 Encounter for screening for infections with a predominantly sexual mode of transmission: Secondary | ICD-10-CM | POA: Diagnosis not present

## 2020-09-05 DIAGNOSIS — F4311 Post-traumatic stress disorder, acute: Secondary | ICD-10-CM | POA: Diagnosis not present

## 2020-09-05 DIAGNOSIS — F330 Major depressive disorder, recurrent, mild: Secondary | ICD-10-CM | POA: Diagnosis not present

## 2020-09-05 DIAGNOSIS — E108 Type 1 diabetes mellitus with unspecified complications: Secondary | ICD-10-CM | POA: Diagnosis not present

## 2020-09-05 DIAGNOSIS — B373 Candidiasis of vulva and vagina: Secondary | ICD-10-CM | POA: Diagnosis not present

## 2020-09-05 DIAGNOSIS — I2510 Atherosclerotic heart disease of native coronary artery without angina pectoris: Secondary | ICD-10-CM | POA: Diagnosis not present

## 2020-09-05 DIAGNOSIS — Z0000 Encounter for general adult medical examination without abnormal findings: Secondary | ICD-10-CM | POA: Diagnosis not present

## 2020-09-05 DIAGNOSIS — F4010 Social phobia, unspecified: Secondary | ICD-10-CM | POA: Diagnosis not present

## 2020-09-05 DIAGNOSIS — Z1159 Encounter for screening for other viral diseases: Secondary | ICD-10-CM | POA: Diagnosis not present

## 2020-09-05 DIAGNOSIS — F3170 Bipolar disorder, currently in remission, most recent episode unspecified: Secondary | ICD-10-CM | POA: Diagnosis not present

## 2020-09-06 ENCOUNTER — Other Ambulatory Visit: Payer: Self-pay | Admitting: Family Medicine

## 2020-09-06 DIAGNOSIS — Z1231 Encounter for screening mammogram for malignant neoplasm of breast: Secondary | ICD-10-CM

## 2020-09-07 DIAGNOSIS — F410 Panic disorder [episodic paroxysmal anxiety] without agoraphobia: Secondary | ICD-10-CM | POA: Diagnosis not present

## 2020-09-07 DIAGNOSIS — E1059 Type 1 diabetes mellitus with other circulatory complications: Secondary | ICD-10-CM | POA: Diagnosis not present

## 2020-09-07 DIAGNOSIS — F39 Unspecified mood [affective] disorder: Secondary | ICD-10-CM | POA: Diagnosis not present

## 2020-09-07 LAB — CYTOLOGY - PAP
Comment: NEGATIVE
Diagnosis: NEGATIVE
High risk HPV: NEGATIVE

## 2020-09-12 DIAGNOSIS — F4010 Social phobia, unspecified: Secondary | ICD-10-CM | POA: Diagnosis not present

## 2020-09-12 DIAGNOSIS — F410 Panic disorder [episodic paroxysmal anxiety] without agoraphobia: Secondary | ICD-10-CM | POA: Diagnosis not present

## 2020-09-12 DIAGNOSIS — F330 Major depressive disorder, recurrent, mild: Secondary | ICD-10-CM | POA: Diagnosis not present

## 2020-09-12 DIAGNOSIS — F4311 Post-traumatic stress disorder, acute: Secondary | ICD-10-CM | POA: Diagnosis not present

## 2020-09-19 DIAGNOSIS — F410 Panic disorder [episodic paroxysmal anxiety] without agoraphobia: Secondary | ICD-10-CM | POA: Diagnosis not present

## 2020-09-19 DIAGNOSIS — F330 Major depressive disorder, recurrent, mild: Secondary | ICD-10-CM | POA: Diagnosis not present

## 2020-09-19 DIAGNOSIS — F4010 Social phobia, unspecified: Secondary | ICD-10-CM | POA: Diagnosis not present

## 2020-09-19 DIAGNOSIS — F4311 Post-traumatic stress disorder, acute: Secondary | ICD-10-CM | POA: Diagnosis not present

## 2020-09-21 ENCOUNTER — Encounter: Payer: Self-pay | Admitting: Endocrinology

## 2020-09-21 ENCOUNTER — Other Ambulatory Visit: Payer: Self-pay

## 2020-09-21 ENCOUNTER — Ambulatory Visit: Payer: BC Managed Care – PPO | Admitting: Endocrinology

## 2020-09-21 VITALS — BP 124/84 | HR 83 | Ht 68.0 in | Wt 161.8 lb

## 2020-09-21 DIAGNOSIS — E109 Type 1 diabetes mellitus without complications: Secondary | ICD-10-CM

## 2020-09-21 LAB — POCT GLYCOSYLATED HEMOGLOBIN (HGB A1C): Hemoglobin A1C: 8.4 % — AB (ref 4.0–5.6)

## 2020-09-21 MED ORDER — TRESIBA FLEXTOUCH 100 UNIT/ML ~~LOC~~ SOPN
28.0000 [IU] | PEN_INJECTOR | Freq: Every day | SUBCUTANEOUS | 3 refills | Status: DC
Start: 2020-09-21 — End: 2020-11-21

## 2020-09-21 MED ORDER — FLUCONAZOLE 150 MG PO TABS: 150.0000 mg | ORAL_TABLET | Freq: Every day | ORAL | 5 refills | Status: DC

## 2020-09-21 MED ORDER — INSULIN ASPART 100 UNIT/ML CARTRIDGE (PENFILL): SUBCUTANEOUS | 11 refills | Status: DC

## 2020-09-21 MED ORDER — FREESTYLE LIBRE 2 SENSOR MISC: 1.0000 | 3 refills | Status: DC

## 2020-09-21 MED ORDER — ACETONE (URINE) TEST VI STRP: 1.0000 | ORAL_STRIP | 0 refills | Status: DC | PRN

## 2020-09-21 NOTE — Patient Instructions (Signed)
good diet and exercise significantly improve the control of your diabetes.  please let me know if you wish to be referred to a dietician.  high blood sugar is very risky to your health.  you should see an eye doctor and dentist every year.  It is very important to get all recommended vaccinations.  Controlling your blood pressure and cholesterol drastically reduces the damage diabetes does to your body.  Those who smoke should quit.  Please discuss these with your doctor.  Please reduce the Tresiba to 26 units per day, and:  Increase the Novolog to 1 unit/8 grams (and the same extra units).   Please come back for a follow-up appointment in 2 months.

## 2020-09-21 NOTE — Progress Notes (Signed)
Subjective:    Patient ID: Alicia Phillips, female    DOB: 10/07/1980, 40 y.o.   MRN: [id number]  HPI pt is referred by Dr Tracie Harrier, for diabetes.  Pt states DM was dx'ed in 1986; it is complicated by DR and CAD; she has been on insulin since dx; pt says his diet and exercise are good; he has never had pancreatitis, pancreatic surgery, severe hypoglycemia.  Only episode of DKA was 2003. She has lost 20 lbs x 1 year--intentional.  she take Tresiba 28/d, and Novolog 1 unit/10 g CHO, and 1 unit per each 30 over 120 (averages approx total of just 7 units/d, in 2-3 doses/d). She stopped pump rx 1994-2000.  She stopped, as she feels it caused excess eating.  She has [place] continuous glucose monitor.  I reviewed continuous glucose monitor data.  Glucose varies from 65-400.  There is little trend throughout the day, but it is lowest in the middle of the night. Past Medical History:  Diagnosis Date  . Anxiety   . Bipolar disorder (HCC)   . CAD (coronary artery disease)    a. LHC 04/05/20: 95% stenosis of mid LAD s/p DES, 30% stenosis of proximal to mid LAD  . Diabetes mellitus type 1 (HCC)   . History of borderline personality disorder   . Hyperlipidemia   . Major depression   . STEMI (ST elevation myocardial infarction) (HCC) 04/05/2020  . Tobacco use     Past Surgical History:  Procedure Laterality Date  . CORONARY STENT INTERVENTION N/A 04/05/2020   Procedure: CORONARY STENT INTERVENTION;  Surgeon: Iran Ouch, MD;  Location: [facility];  Service: Cardiovascular;  Laterality: N/A;  . LEFT HEART CATH AND CORONARY ANGIOGRAPHY N/A 04/05/2020   Procedure: LEFT HEART CATH AND CORONARY ANGIOGRAPHY;  Surgeon: Iran Ouch, MD;  Location: [facility];  Service: Cardiovascular;  Laterality: N/A;    Social History   Socioeconomic History  . Marital status: Single    Spouse name: Not on file  . Number of children: Not on file  . Years of education: Not on file  . Highest education  level: Not on file  Occupational History  . Not on file  Tobacco Use  . Smoking status: Former Smoker    Types: Cigarettes  . Smokeless tobacco: Never Used  . Tobacco comment: patient stated she was smoking but has quit since 05/30/20  Vaping Use  . Vaping Use: Never used  Substance and Sexual Activity  . Alcohol use: Never  . Drug use: Never  . Sexual activity: Not on file  Other Topics Concern  . Not on file  Social History Narrative  . Not on file   Social Determinants of Health   Financial Resource Strain: Not on file  Food Insecurity: Not on file  Transportation Needs: Not on file  Physical Activity: Not on file  Stress: Not on file  Social Connections: Not on file  Intimate Partner Violence: Not on file    Current Outpatient Medications on File Prior to Visit  Medication Sig Dispense Refill  . ACETAMINOPHEN EXTRA STRENGTH 500 MG tablet Take 500 mg by mouth every 4 (four) hours as needed for mild pain or headache.     Marland Kitchen aspirin 81 MG chewable tablet Chew 1 tablet (81 mg total) by mouth daily.    Marland Kitchen atorvastatin (LIPITOR) 80 MG tablet Take 1 tablet (80 mg total) by mouth daily. 90 tablet 3  . FLUoxetine (PROZAC) 20 MG  capsule Take 20 mg by mouth daily.    Morton Amy 100-PINK-NOVO DEVI 3 (three) times daily.    Marland Kitchen lamoTRIgine (LAMICTAL) 100 MG tablet Take 1 tablet (100 mg total) by mouth at bedtime. Take one tablet at bedtime. 30 tablet 1  . metoprolol succinate (TOPROL XL) 25 MG 24 hr tablet Take 1 tablet (25 mg total) by mouth in the morning and at bedtime. 180 tablet 3  . METOPROLOL TARTRATE PO Take 1 tablet by mouth in the morning and at bedtime.    . nitroGLYCERIN (NITROSTAT) 0.4 MG SL tablet Place 1 tablet (0.4 mg total) under the tongue every 5 (five) minutes as needed for chest pain. 25 tablet 2  . NOVOFINE PEN NEEDLE 32G X 6 MM MISC SMARTSIG:Injection 5 Times Daily    . ticagrelor (BRILINTA) 90 MG TABS tablet Take 1 tablet (90 mg total) by mouth 2 (two) times daily. 60  tablet 11   No current facility-administered medications on file prior to visit.    Allergies  Allergen Reactions  . Sulfamethoxazole-Trimethoprim Rash    Family History  Problem Relation Age of Onset  . Depression Father   . Breast cancer Maternal Grandmother   . Depression Brother   . Anxiety disorder Brother   . ADD / ADHD Brother   . Alcohol abuse Brother   . Alcohol abuse Maternal Grandfather   . Diabetes Neg Hx     BP 124/84 (BP Location: Right Arm, Patient Position: Sitting, Cuff Size: Normal)   Pulse 83   Ht 5\' 8"  (1.727 m)   Wt 161 lb 12.8 oz (73.4 kg)   SpO2 96%   BMI 24.60 kg/m     Review of Systems denies blurry vision, sob, n/v, and urinary frequency. Depression is well-controlled.      Objective:   Physical Exam VITAL SIGNS:  See vs page GENERAL: no distress Pulses: dorsalis pedis intact bilat.   MSK: no deformity of the feet CV: no leg edema Skin:  no ulcer on the feet.  normal color and temp on the feet. Neuro: sensation is intact to touch on the feet  A1c=8.4%  I have reviewed outside records, and summarized: Pt was noted to have elevated A1c, and referred here.  Bipolar disorder, dyslipidemia, and wellness were also addressed.     Assessment & Plan:  Type 1 DM, with CAD: uncontrolled.  Hypoglycemia, due to insulin: this limits aggressiveness of glycemic control.   Patient Instructions  good diet and exercise significantly improve the control of your diabetes.  please let me know if you wish to be referred to a dietician.  high blood sugar is very risky to your health.  you should see an eye doctor and dentist every year.  It is very important to get all recommended vaccinations.  Controlling your blood pressure and cholesterol drastically reduces the damage diabetes does to your body.  Those who smoke should quit.  Please discuss these with your doctor.  Please reduce the Tresiba to 26 units per day, and:  Increase the Novolog to 1 unit/8  grams (and the same extra units).   Please come back for a follow-up appointment in 2 months.

## 2020-09-26 DIAGNOSIS — F4311 Post-traumatic stress disorder, acute: Secondary | ICD-10-CM | POA: Diagnosis not present

## 2020-09-26 DIAGNOSIS — F4010 Social phobia, unspecified: Secondary | ICD-10-CM | POA: Diagnosis not present

## 2020-09-26 DIAGNOSIS — F330 Major depressive disorder, recurrent, mild: Secondary | ICD-10-CM | POA: Diagnosis not present

## 2020-09-26 DIAGNOSIS — F410 Panic disorder [episodic paroxysmal anxiety] without agoraphobia: Secondary | ICD-10-CM | POA: Diagnosis not present

## 2020-10-05 DIAGNOSIS — F410 Panic disorder [episodic paroxysmal anxiety] without agoraphobia: Secondary | ICD-10-CM | POA: Diagnosis not present

## 2020-10-05 DIAGNOSIS — E1059 Type 1 diabetes mellitus with other circulatory complications: Secondary | ICD-10-CM | POA: Diagnosis not present

## 2020-10-05 DIAGNOSIS — F39 Unspecified mood [affective] disorder: Secondary | ICD-10-CM | POA: Diagnosis not present

## 2020-10-10 DIAGNOSIS — F4010 Social phobia, unspecified: Secondary | ICD-10-CM | POA: Diagnosis not present

## 2020-10-10 DIAGNOSIS — F330 Major depressive disorder, recurrent, mild: Secondary | ICD-10-CM | POA: Diagnosis not present

## 2020-10-10 DIAGNOSIS — F410 Panic disorder [episodic paroxysmal anxiety] without agoraphobia: Secondary | ICD-10-CM | POA: Diagnosis not present

## 2020-10-10 DIAGNOSIS — F4311 Post-traumatic stress disorder, acute: Secondary | ICD-10-CM | POA: Diagnosis not present

## 2020-10-24 DIAGNOSIS — F330 Major depressive disorder, recurrent, mild: Secondary | ICD-10-CM | POA: Diagnosis not present

## 2020-10-24 DIAGNOSIS — F4311 Post-traumatic stress disorder, acute: Secondary | ICD-10-CM | POA: Diagnosis not present

## 2020-10-24 DIAGNOSIS — F410 Panic disorder [episodic paroxysmal anxiety] without agoraphobia: Secondary | ICD-10-CM | POA: Diagnosis not present

## 2020-10-24 DIAGNOSIS — F4010 Social phobia, unspecified: Secondary | ICD-10-CM | POA: Diagnosis not present

## 2020-10-31 DIAGNOSIS — F4010 Social phobia, unspecified: Secondary | ICD-10-CM | POA: Diagnosis not present

## 2020-10-31 DIAGNOSIS — F410 Panic disorder [episodic paroxysmal anxiety] without agoraphobia: Secondary | ICD-10-CM | POA: Diagnosis not present

## 2020-10-31 DIAGNOSIS — F4311 Post-traumatic stress disorder, acute: Secondary | ICD-10-CM | POA: Diagnosis not present

## 2020-10-31 DIAGNOSIS — F330 Major depressive disorder, recurrent, mild: Secondary | ICD-10-CM | POA: Diagnosis not present

## 2020-11-14 DIAGNOSIS — F4311 Post-traumatic stress disorder, acute: Secondary | ICD-10-CM | POA: Diagnosis not present

## 2020-11-14 DIAGNOSIS — F330 Major depressive disorder, recurrent, mild: Secondary | ICD-10-CM | POA: Diagnosis not present

## 2020-11-14 DIAGNOSIS — F4010 Social phobia, unspecified: Secondary | ICD-10-CM | POA: Diagnosis not present

## 2020-11-14 DIAGNOSIS — F410 Panic disorder [episodic paroxysmal anxiety] without agoraphobia: Secondary | ICD-10-CM | POA: Diagnosis not present

## 2020-11-21 ENCOUNTER — Other Ambulatory Visit: Payer: Self-pay

## 2020-11-21 ENCOUNTER — Encounter: Payer: Self-pay | Admitting: Endocrinology

## 2020-11-21 ENCOUNTER — Ambulatory Visit: Payer: BC Managed Care – PPO | Admitting: Endocrinology

## 2020-11-21 VITALS — BP 118/76 | HR 76 | Ht 68.0 in | Wt 166.0 lb

## 2020-11-21 DIAGNOSIS — F4311 Post-traumatic stress disorder, acute: Secondary | ICD-10-CM | POA: Diagnosis not present

## 2020-11-21 DIAGNOSIS — F330 Major depressive disorder, recurrent, mild: Secondary | ICD-10-CM | POA: Diagnosis not present

## 2020-11-21 DIAGNOSIS — F4010 Social phobia, unspecified: Secondary | ICD-10-CM | POA: Diagnosis not present

## 2020-11-21 DIAGNOSIS — E109 Type 1 diabetes mellitus without complications: Secondary | ICD-10-CM | POA: Diagnosis not present

## 2020-11-21 DIAGNOSIS — F410 Panic disorder [episodic paroxysmal anxiety] without agoraphobia: Secondary | ICD-10-CM | POA: Diagnosis not present

## 2020-11-21 LAB — POCT GLYCOSYLATED HEMOGLOBIN (HGB A1C): Hemoglobin A1C: 8 % — AB (ref 4.0–5.6)

## 2020-11-21 MED ORDER — TRESIBA FLEXTOUCH 100 UNIT/ML ~~LOC~~ SOPN: 26.0000 [IU] | PEN_INJECTOR | Freq: Every day | SUBCUTANEOUS | 3 refills | Status: DC

## 2020-11-21 NOTE — Progress Notes (Signed)
Subjective:    Patient ID: Alicia Phillips, female    DOB: 20-Aug-1980, 40 y.o.   MRN: [id number]  HPI Pt returns for f/u of diabetes mellitus: DM type: 1 Dx'ed: 1986 Complications: DR and CAD Therapy: insulin since dx GDM: never DKA: once (2003) Severe hypoglycemia: never Pancreatitis: never Pancreatic imaging: never SDOH: none Other: she takes multiple daily injections; She has [place] continuous glucose monitor; She took pump rx 1994-2000.  She stopped, as she feels it caused excess eating. Interval history: She takes [place] 27 units qd, and a total of 9.6 units of Novolog per day (1 unit/8g CHO).  She eats meals at 8AM, 2PM, and 8PM.  She has mild hypoglycemia morre than once per day.  I reviewed continuous glucose monitor data.  Glucose varies from 68-340.  There is little trend throughout the day, but it is lowest at 10PM, and highest at 2AM.  Past Medical History:  Diagnosis Date  . Anxiety   . Bipolar disorder (HCC)   . CAD (coronary artery disease)    a. LHC 04/05/20: 95% stenosis of mid LAD s/p DES, 30% stenosis of proximal to mid LAD  . Diabetes mellitus type 1 (HCC)   . History of borderline personality disorder   . Hyperlipidemia   . Major depression   . STEMI (ST elevation myocardial infarction) (HCC) 04/05/2020  . Tobacco use     Past Surgical History:  Procedure Laterality Date  . CORONARY STENT INTERVENTION N/A 04/05/2020   Procedure: CORONARY STENT INTERVENTION;  Surgeon: Iran Ouch, MD;  Location: [facility];  Service: Cardiovascular;  Laterality: N/A;  . LEFT HEART CATH AND CORONARY ANGIOGRAPHY N/A 04/05/2020   Procedure: LEFT HEART CATH AND CORONARY ANGIOGRAPHY;  Surgeon: Iran Ouch, MD;  Location: [facility];  Service: Cardiovascular;  Laterality: N/A;    Social History   Socioeconomic History  . Marital status: Single    Spouse name: Not on file  . Number of children: Not on file  . Years of education: Not on file  . Highest  education level: Not on file  Occupational History  . Not on file  Tobacco Use  . Smoking status: Former Smoker    Types: Cigarettes  . Smokeless tobacco: Never Used  . Tobacco comment: patient stated she was smoking but has quit since 05/30/20  Vaping Use  . Vaping Use: Never used  Substance and Sexual Activity  . Alcohol use: Never  . Drug use: Never  . Sexual activity: Not on file  Other Topics Concern  . Not on file  Social History Narrative  . Not on file   Social Determinants of Health   Financial Resource Strain: Not on file  Food Insecurity: Not on file  Transportation Needs: Not on file  Physical Activity: Not on file  Stress: Not on file  Social Connections: Not on file  Intimate Partner Violence: Not on file    Current Outpatient Medications on File Prior to Visit  Medication Sig Dispense Refill  . ACETAMINOPHEN EXTRA STRENGTH 500 MG tablet Take 500 mg by mouth every 4 (four) hours as needed for mild pain or headache.     Marland Kitchen acetone, urine, test strip 1 strip by Does not apply route as needed for high blood sugar. 25 each 0  . aspirin 81 MG chewable tablet Chew 1 tablet (81 mg total) by mouth daily.    Marland Kitchen atorvastatin (LIPITOR) 80 MG tablet Take 1 tablet (80 mg total)  by mouth daily. 90 tablet 3  . Continuous Blood Gluc Sensor (FREESTYLE LIBRE 2 SENSOR) MISC 1 Device by Does not apply route every 14 (fourteen) days. 2 each 3  . fluconazole (DIFLUCAN) 150 MG tablet Take 1 tablet (150 mg total) by mouth daily. 3 tablet 5  . FLUoxetine (PROZAC) 20 MG capsule Take 20 mg by mouth daily.    Morton Amy 100-PINK-NOVO DEVI 3 (three) times daily.    . insulin aspart (NOVOLOG) cartridge As directed, for a total of 15 units/day 9 mL 11  . lamoTRIgine (LAMICTAL) 100 MG tablet Take 1 tablet (100 mg total) by mouth at bedtime. Take one tablet at bedtime. 30 tablet 1  . metoprolol succinate (TOPROL XL) 25 MG 24 hr tablet Take 1 tablet (25 mg total) by mouth in the morning and at  bedtime. 180 tablet 3  . METOPROLOL TARTRATE PO Take 1 tablet by mouth in the morning and at bedtime.    . nitroGLYCERIN (NITROSTAT) 0.4 MG SL tablet Place 1 tablet (0.4 mg total) under the tongue every 5 (five) minutes as needed for chest pain. 25 tablet 2  . NOVOFINE PEN NEEDLE 32G X 6 MM MISC SMARTSIG:Injection 5 Times Daily    . ticagrelor (BRILINTA) 90 MG TABS tablet Take 1 tablet (90 mg total) by mouth 2 (two) times daily. 60 tablet 11   No current facility-administered medications on file prior to visit.    Allergies  Allergen Reactions  . Sulfamethoxazole-Trimethoprim Rash    Family History  Problem Relation Age of Onset  . Depression Father   . Breast cancer Maternal Grandmother   . Depression Brother   . Anxiety disorder Brother   . ADD / ADHD Brother   . Alcohol abuse Brother   . Alcohol abuse Maternal Grandfather   . Diabetes Neg Hx     BP 118/76 (BP Location: Right Arm, Patient Position: Sitting, Cuff Size: Normal)   Pulse 76   Ht 5\' 8"  (1.727 m)   Wt 166 lb (75.3 kg)   SpO2 98%   BMI 25.24 kg/m    Review of Systems     Objective:   Physical Exam VITAL SIGNS:  See vs page GENERAL: no distress Pulses: dorsalis pedis intact bilat.   MSK: no deformity of the feet CV: no leg edema Skin:  no ulcer on the feet.  normal color and temp on the feet. Neuro: sensation is intact to touch on the feet  Lab Results  Component Value Date   HGBA1C 8.0 (A) 11/21/2020        Assessment & Plan:  Type 1 DM: uncontrolled Hypoglycemia, due to insulin: this limits aggressiveness of glycemic control  Patient Instructions  Please reduce the Tresiba to 26 units per day, and:  change the Novolog to 1 unit/6 grams with breakfast and lunch, and 1 unit 12 grams at supper (and the same extra units).   Please come back for a follow-up appointment in 2-3 months.

## 2020-11-21 NOTE — Patient Instructions (Addendum)
Please reduce the Tresiba to 26 units per day, and:  change the Novolog to 1 unit/6 grams with breakfast and lunch, and 1 unit 12 grams at supper (and the same extra units).   Please come back for a follow-up appointment in 2-3 months.

## 2020-11-28 DIAGNOSIS — F410 Panic disorder [episodic paroxysmal anxiety] without agoraphobia: Secondary | ICD-10-CM | POA: Diagnosis not present

## 2020-11-28 DIAGNOSIS — F4010 Social phobia, unspecified: Secondary | ICD-10-CM | POA: Diagnosis not present

## 2020-11-28 DIAGNOSIS — F330 Major depressive disorder, recurrent, mild: Secondary | ICD-10-CM | POA: Diagnosis not present

## 2020-11-28 DIAGNOSIS — F4311 Post-traumatic stress disorder, acute: Secondary | ICD-10-CM | POA: Diagnosis not present

## 2020-12-04 ENCOUNTER — Other Ambulatory Visit: Payer: Self-pay | Admitting: Cardiovascular Disease

## 2020-12-04 NOTE — Telephone Encounter (Signed)
Rx(s) sent to pharmacy electronically.  

## 2020-12-05 DIAGNOSIS — F4311 Post-traumatic stress disorder, acute: Secondary | ICD-10-CM | POA: Diagnosis not present

## 2020-12-05 DIAGNOSIS — F4010 Social phobia, unspecified: Secondary | ICD-10-CM | POA: Diagnosis not present

## 2020-12-05 DIAGNOSIS — F330 Major depressive disorder, recurrent, mild: Secondary | ICD-10-CM | POA: Diagnosis not present

## 2020-12-05 DIAGNOSIS — F410 Panic disorder [episodic paroxysmal anxiety] without agoraphobia: Secondary | ICD-10-CM | POA: Diagnosis not present

## 2020-12-11 DIAGNOSIS — F39 Unspecified mood [affective] disorder: Secondary | ICD-10-CM | POA: Diagnosis not present

## 2020-12-11 DIAGNOSIS — F410 Panic disorder [episodic paroxysmal anxiety] without agoraphobia: Secondary | ICD-10-CM | POA: Diagnosis not present

## 2020-12-19 DIAGNOSIS — F4311 Post-traumatic stress disorder, acute: Secondary | ICD-10-CM | POA: Diagnosis not present

## 2020-12-19 DIAGNOSIS — F410 Panic disorder [episodic paroxysmal anxiety] without agoraphobia: Secondary | ICD-10-CM | POA: Diagnosis not present

## 2020-12-19 DIAGNOSIS — F4010 Social phobia, unspecified: Secondary | ICD-10-CM | POA: Diagnosis not present

## 2020-12-19 DIAGNOSIS — F330 Major depressive disorder, recurrent, mild: Secondary | ICD-10-CM | POA: Diagnosis not present

## 2020-12-26 DIAGNOSIS — F410 Panic disorder [episodic paroxysmal anxiety] without agoraphobia: Secondary | ICD-10-CM | POA: Diagnosis not present

## 2020-12-26 DIAGNOSIS — F4010 Social phobia, unspecified: Secondary | ICD-10-CM | POA: Diagnosis not present

## 2020-12-26 DIAGNOSIS — F330 Major depressive disorder, recurrent, mild: Secondary | ICD-10-CM | POA: Diagnosis not present

## 2020-12-26 DIAGNOSIS — F4311 Post-traumatic stress disorder, acute: Secondary | ICD-10-CM | POA: Diagnosis not present

## 2021-01-08 DIAGNOSIS — K602 Anal fissure, unspecified: Secondary | ICD-10-CM | POA: Diagnosis not present

## 2021-01-09 DIAGNOSIS — F4311 Post-traumatic stress disorder, acute: Secondary | ICD-10-CM | POA: Diagnosis not present

## 2021-01-09 DIAGNOSIS — F4010 Social phobia, unspecified: Secondary | ICD-10-CM | POA: Diagnosis not present

## 2021-01-09 DIAGNOSIS — F330 Major depressive disorder, recurrent, mild: Secondary | ICD-10-CM | POA: Diagnosis not present

## 2021-01-09 DIAGNOSIS — F410 Panic disorder [episodic paroxysmal anxiety] without agoraphobia: Secondary | ICD-10-CM | POA: Diagnosis not present

## 2021-01-16 DIAGNOSIS — F410 Panic disorder [episodic paroxysmal anxiety] without agoraphobia: Secondary | ICD-10-CM | POA: Diagnosis not present

## 2021-01-16 DIAGNOSIS — F330 Major depressive disorder, recurrent, mild: Secondary | ICD-10-CM | POA: Diagnosis not present

## 2021-01-16 DIAGNOSIS — F4311 Post-traumatic stress disorder, acute: Secondary | ICD-10-CM | POA: Diagnosis not present

## 2021-01-16 DIAGNOSIS — F4010 Social phobia, unspecified: Secondary | ICD-10-CM | POA: Diagnosis not present

## 2021-01-23 DIAGNOSIS — F410 Panic disorder [episodic paroxysmal anxiety] without agoraphobia: Secondary | ICD-10-CM | POA: Diagnosis not present

## 2021-01-23 DIAGNOSIS — F4010 Social phobia, unspecified: Secondary | ICD-10-CM | POA: Diagnosis not present

## 2021-01-23 DIAGNOSIS — F330 Major depressive disorder, recurrent, mild: Secondary | ICD-10-CM | POA: Diagnosis not present

## 2021-01-23 DIAGNOSIS — F4311 Post-traumatic stress disorder, acute: Secondary | ICD-10-CM | POA: Diagnosis not present

## 2021-01-30 DIAGNOSIS — F410 Panic disorder [episodic paroxysmal anxiety] without agoraphobia: Secondary | ICD-10-CM | POA: Diagnosis not present

## 2021-01-30 DIAGNOSIS — F4311 Post-traumatic stress disorder, acute: Secondary | ICD-10-CM | POA: Diagnosis not present

## 2021-01-30 DIAGNOSIS — F4010 Social phobia, unspecified: Secondary | ICD-10-CM | POA: Diagnosis not present

## 2021-01-30 DIAGNOSIS — F330 Major depressive disorder, recurrent, mild: Secondary | ICD-10-CM | POA: Diagnosis not present

## 2021-02-19 DIAGNOSIS — R42 Dizziness and giddiness: Secondary | ICD-10-CM | POA: Diagnosis not present

## 2021-02-19 DIAGNOSIS — I491 Atrial premature depolarization: Secondary | ICD-10-CM | POA: Diagnosis not present

## 2021-02-19 DIAGNOSIS — R457 State of emotional shock and stress, unspecified: Secondary | ICD-10-CM | POA: Diagnosis not present

## 2021-02-19 DIAGNOSIS — R002 Palpitations: Secondary | ICD-10-CM | POA: Insufficient documentation

## 2021-02-19 DIAGNOSIS — R739 Hyperglycemia, unspecified: Secondary | ICD-10-CM | POA: Diagnosis not present

## 2021-02-19 DIAGNOSIS — I214 Non-ST elevation (NSTEMI) myocardial infarction: Secondary | ICD-10-CM | POA: Insufficient documentation

## 2021-02-19 DIAGNOSIS — M545 Low back pain, unspecified: Secondary | ICD-10-CM | POA: Insufficient documentation

## 2021-02-19 DIAGNOSIS — M25512 Pain in left shoulder: Secondary | ICD-10-CM | POA: Insufficient documentation

## 2021-02-19 DIAGNOSIS — Z5321 Procedure and treatment not carried out due to patient leaving prior to being seen by health care provider: Secondary | ICD-10-CM | POA: Insufficient documentation

## 2021-02-20 ENCOUNTER — Encounter (HOSPITAL_COMMUNITY): Payer: Self-pay | Admitting: Emergency Medicine

## 2021-02-20 ENCOUNTER — Emergency Department (HOSPITAL_COMMUNITY)
Admission: EM | Admit: 2021-02-20 | Discharge: 2021-02-20 | Disposition: A | Discharge status: Home / Self Care | Payer: BC Managed Care – PPO | Attending: Emergency Medicine | Admitting: Emergency Medicine

## 2021-02-20 ENCOUNTER — Encounter: Payer: Self-pay | Admitting: Endocrinology

## 2021-02-20 ENCOUNTER — Other Ambulatory Visit: Payer: Self-pay

## 2021-02-20 ENCOUNTER — Emergency Department (HOSPITAL_COMMUNITY): Payer: BC Managed Care – PPO

## 2021-02-20 ENCOUNTER — Ambulatory Visit: Payer: BC Managed Care – PPO | Admitting: Endocrinology

## 2021-02-20 VITALS — BP 130/82 | HR 82 | Ht 68.0 in | Wt 183.6 lb

## 2021-02-20 DIAGNOSIS — E109 Type 1 diabetes mellitus without complications: Secondary | ICD-10-CM | POA: Diagnosis not present

## 2021-02-20 DIAGNOSIS — R002 Palpitations: Secondary | ICD-10-CM | POA: Diagnosis not present

## 2021-02-20 LAB — CBC WITH DIFFERENTIAL/PLATELET
Abs Immature Granulocytes: 0.04 10*3/uL (ref 0.00–0.07)
Basophils Absolute: 0.1 10*3/uL (ref 0.0–0.1)
Basophils Relative: 1 %
Eosinophils Absolute: 0.3 10*3/uL (ref 0.0–0.5)
Eosinophils Relative: 3 %
HCT: 37.3 % (ref 36.0–46.0)
Hemoglobin: 12.5 g/dL (ref 12.0–15.0)
Immature Granulocytes: 0 %
Lymphocytes Relative: 20 %
Lymphs Abs: 2.4 10*3/uL (ref 0.7–4.0)
MCH: 31.1 pg (ref 26.0–34.0)
MCHC: 33.5 g/dL (ref 30.0–36.0)
MCV: 92.8 fL (ref 80.0–100.0)
Monocytes Absolute: 0.6 10*3/uL (ref 0.1–1.0)
Monocytes Relative: 5 %
Neutro Abs: 8.6 10*3/uL — ABNORMAL HIGH (ref 1.7–7.7)
Neutrophils Relative %: 71 %
Platelets: 340 10*3/uL (ref 150–400)
RBC: 4.02 MIL/uL (ref 3.87–5.11)
RDW: 13.2 % (ref 11.5–15.5)
WBC: 12 10*3/uL — ABNORMAL HIGH (ref 4.0–10.5)
nRBC: 0 % (ref 0.0–0.2)

## 2021-02-20 LAB — PROTIME-INR
INR: 0.9 (ref 0.8–1.2)
Prothrombin Time: 11.8 seconds (ref 11.4–15.2)

## 2021-02-20 LAB — BASIC METABOLIC PANEL
Anion gap: 10 (ref 5–15)
BUN: 12 mg/dL (ref 6–20)
CO2: 21 mmol/L — ABNORMAL LOW (ref 22–32)
Calcium: 9.2 mg/dL (ref 8.9–10.3)
Chloride: 105 mmol/L (ref 98–111)
Creatinine, Ser: 0.89 mg/dL (ref 0.44–1.00)
GFR, Estimated: >60 mL/min (ref >60)
Glucose, Bld: 218 mg/dL — ABNORMAL HIGH (ref 70–99)
Potassium: 4.3 mmol/L (ref 3.5–5.1)
Sodium: 136 mmol/L (ref 135–145)

## 2021-02-20 LAB — TROPONIN I (HIGH SENSITIVITY)
Troponin I (High Sensitivity): 3 ng/L (ref <18)
Troponin I (High Sensitivity): 3 ng/L (ref <18)

## 2021-02-20 LAB — POCT GLYCOSYLATED HEMOGLOBIN (HGB A1C): Hemoglobin A1C: 8.2 % — AB (ref 4.0–5.6)

## 2021-02-20 LAB — ABO/RH: ABO/RH(D): A POS

## 2021-02-20 LAB — TYPE AND SCREEN
ABO/RH(D): A POS
Antibody Screen: NEGATIVE

## 2021-02-20 MED ORDER — TRESIBA FLEXTOUCH 100 UNIT/ML ~~LOC~~ SOPN: 28.0000 [IU] | PEN_INJECTOR | Freq: Every day | SUBCUTANEOUS | 3 refills | Status: DC

## 2021-02-20 MED ORDER — DEXCOM G6 SENSOR MISC: 1.0000 | 3 refills | Status: DC

## 2021-02-20 MED ORDER — INSULIN ASPART 100 UNIT/ML CARTRIDGE (PENFILL): SUBCUTANEOUS | 11 refills | Status: DC

## 2021-02-20 MED ORDER — DEXCOM G6 RECEIVER DEVI
1.0000 | Freq: Once | 1 refills | Status: AC
Start: 2021-02-20 — End: 2021-02-20

## 2021-02-20 MED ORDER — DEXCOM G6 TRANSMITTER MISC: 1.0000 | Freq: Once | 1 refills | Status: DC

## 2021-02-20 NOTE — ED Provider Notes (Signed)
Emergency Medicine Provider Triage Evaluation Note  Alicia Phillips , a 40 y.o. female  was evaluated in triage.  Pt complains of palpitations.  Hx of NSTEMI with subsequent cardiac cath and stent placement in September 2021.  Is about 930 she began noticing palpitations.  If this was the symptom of her last cardiac event.  Additionally she reports she is menstruating and is taking Brilinta.  Reports this is the heaviest menstruation she is ever had.  Has associated lightheadedness and mild diaphoresis.  Review of Systems  Positive: Lightheadedness, palpitations Negative: Vomiting  Physical Exam  BP 115/74 (BP Location: Left Arm)   Pulse 81   Temp 98.4 F (36.9 C) (Oral)   Resp 18   Ht 5\' 8"  (1.727 m)   Wt 92 kg   LMP 02/19/2021   SpO2 97%   BMI 30.84 kg/m  Gen:   Awake, no distress   Resp:  Normal effort  MSK:   Moves extremities without difficulty  Other:  pale  Medical Decision Making  Medically screening exam initiated at 12:29 AM.  Appropriate orders placed.  SAMRA PESCH was informed that the remainder of the evaluation will be completed by another provider, this initial triage assessment does not replace that evaluation, and the importance of remaining in the ED until their evaluation is complete.  Palpitations with Hx of NSTEMI.   Richa Shor, [name] 02/20/21 0037    02/22/21, MD 02/20/21 0200

## 2021-02-20 NOTE — ED Triage Notes (Signed)
Patient arrived with EMS from home reports intermittent palpitations this evening with mid back/left shoulder pain , respirations unlabored , history of MI/Coronary stent , her cardiologist is Dr. Erlene Quan . No chest pain . She received ASA 324 mg IV by EMS .

## 2021-02-20 NOTE — Progress Notes (Signed)
Subjective:    Patient ID: Alicia Phillips, female    DOB: 06-21-1981, 40 y.o.   MRN: [id number]  HPI Pt returns for f/u of diabetes mellitus:   DM type: 1 Dx'ed: 1986 Complications: DR and CAD Therapy: insulin since dx GDM: never DKA: once (2003) Severe hypoglycemia: never Pancreatitis: never Pancreatic imaging: never SDOH: none Other: she takes multiple daily injections; She has [place] continuous glucose monitor; She took pump rx 1994-2000.  She stopped, as she feels it caused excess eating; She eats meals at 8AM, 2PM, and 8PM. Interval history: She takes [place] 27 units qd, and Novolog 1 unit/6 grams with breakfast and lunch, and 1 unit 12 grams at supper (and the same extra units), for a total of approx 18 units per day.   She has mild hypoglycemia approx once per day.  I reviewed continuous glucose monitor data.  Glucose varies from 75-330.  There is little trend throughout the day, but it is lowest 1PM-6PM.  She has mild hypoglycemia approx qd. Pt says FL CGM is causing bruising.   Past Medical History:  Diagnosis Date   Anxiety    Bipolar disorder (HCC)    CAD (coronary artery disease)    a. LHC 04/05/20: 95% stenosis of mid LAD s/p DES, 30% stenosis of proximal to mid LAD   Diabetes mellitus type 1 (HCC)    History of borderline personality disorder    Hyperlipidemia    Major depression    STEMI (ST elevation myocardial infarction) (HCC) 04/05/2020   Tobacco use     Past Surgical History:  Procedure Laterality Date   CORONARY STENT INTERVENTION N/A 04/05/2020   Procedure: CORONARY STENT INTERVENTION;  Surgeon: Iran Ouch, MD;  Location: [facility];  Service: Cardiovascular;  Laterality: N/A;   LEFT HEART CATH AND CORONARY ANGIOGRAPHY N/A 04/05/2020   Procedure: LEFT HEART CATH AND CORONARY ANGIOGRAPHY;  Surgeon: Iran Ouch, MD;  Location: [facility];  Service: Cardiovascular;  Laterality: N/A;    Social History   Socioeconomic History    Marital status: Single    Spouse name: Not on file   Number of children: Not on file   Years of education: Not on file   Highest education level: Not on file  Occupational History   Not on file  Tobacco Use   Smoking status: Former    Types: Cigarettes   Smokeless tobacco: Never   Tobacco comments:    patient stated she was smoking but has quit since 05/30/20  Vaping Use   Vaping Use: Never used  Substance and Sexual Activity   Alcohol use: Never   Drug use: Never   Sexual activity: Not on file  Other Topics Concern   Not on file  Social History Narrative   Not on file   Social Determinants of Health   Financial Resource Strain: Not on file  Food Insecurity: Not on file  Transportation Needs: Not on file  Physical Activity: Not on file  Stress: Not on file  Social Connections: Not on file  Intimate Partner Violence: Not on file    Current Outpatient Medications on File Prior to Visit  Medication Sig Dispense Refill   ACETAMINOPHEN EXTRA STRENGTH 500 MG tablet Take 500 mg by mouth every 4 (four) hours as needed for mild pain or headache.      acetone, urine, test strip 1 strip by Does not apply route as needed for high blood sugar. 25 each 0  aspirin 81 MG chewable tablet Chew 1 tablet (81 mg total) by mouth daily.     atorvastatin (LIPITOR) 80 MG tablet Take 1 tablet (80 mg total) by mouth daily. 90 tablet 3   fluconazole (DIFLUCAN) 150 MG tablet Take 1 tablet (150 mg total) by mouth daily. 3 tablet 5   FLUoxetine (PROZAC) 20 MG capsule Take 20 mg by mouth daily.     INPEN 100-PINK-NOVO DEVI 3 (three) times daily.     lamoTRIgine (LAMICTAL) 100 MG tablet Take 1 tablet (100 mg total) by mouth at bedtime. Take one tablet at bedtime. 30 tablet 1   metoprolol succinate (TOPROL-XL) 25 MG 24 hr tablet TAKE 1 TABLET(25 MG) BY MOUTH IN THE MORNING AND AT BEDTIME 180 tablet 2   METOPROLOL TARTRATE PO Take 1 tablet by mouth in the morning and at bedtime.     nitroGLYCERIN  (NITROSTAT) 0.4 MG SL tablet Place 1 tablet (0.4 mg total) under the tongue every 5 (five) minutes as needed for chest pain. 25 tablet 2   NOVOFINE PEN NEEDLE 32G X 6 MM MISC SMARTSIG:Injection 5 Times Daily     ticagrelor (BRILINTA) 90 MG TABS tablet Take 1 tablet (90 mg total) by mouth 2 (two) times daily. 60 tablet 11   No current facility-administered medications on file prior to visit.    Allergies  Allergen Reactions   Sulfamethoxazole-Trimethoprim Rash    Family History  Problem Relation Age of Onset   Depression Father    Breast cancer Maternal Grandmother    Depression Brother    Anxiety disorder Brother    ADD / ADHD Brother    Alcohol abuse Brother    Alcohol abuse Maternal Grandfather    Diabetes Neg Hx     BP 130/82 (BP Location: Right Arm, Patient Position: Sitting, Cuff Size: Normal)   Pulse 82   Ht 5\' 8"  (1.727 m)   Wt 183 lb 9.6 oz (83.3 kg)   LMP 02/19/2021   SpO2 98%   BMI 27.92 kg/m   Review of Systems She has gained weight.      Objective:   Physical Exam Pulses: dorsalis pedis intact bilat.   MSK: no deformity of the feet CV: trace bilat leg edema Skin:  no ulcer on the feet.  normal color and temp on the feet.  There are bilat calluses.  FL continuous glucose monitor is on left arm now--no abnormality seen.   Neuro: sensation is intact to touch on the feet   Lab Results  Component Value Date   HGBA1C 8.2 (A) 02/20/2021       Assessment & Plan:  Type 1 DM: uncontrolled.  Hypoglycemia, due to insulin.  Patient Instructions  Please increase the Tresiba to 28 units per day, and:  change the Novolog to 1 unit/6 grams with breakfast, and 1 unit 12 grams at lunch and supper (and the same extra units).   I have sent a prescription to your pharmacy, for the Dexcom continuous glucose monitor.   Please see a foot specialist.  you will receive a phone call, about a day and time for an appointment.   Please come back for a follow-up appointment in  2 months.

## 2021-02-20 NOTE — ED Notes (Signed)
Called pt no answer °

## 2021-02-20 NOTE — Patient Instructions (Addendum)
Please increase the Tresiba to 28 units per day, and:  change the Novolog to 1 unit/6 grams with breakfast, and 1 unit 12 grams at lunch and supper (and the same extra units).   I have sent a prescription to your pharmacy, for the Dexcom continuous glucose monitor.   Please see a foot specialist.  you will receive a phone call, about a day and time for an appointment.   Please come back for a follow-up appointment in 2 months.

## 2021-02-20 NOTE — ED Notes (Signed)
Pt states she is going to try and call her mom to come and pick her up d/t wait time

## 2021-02-20 NOTE — ED Notes (Signed)
Pt name called for updated vitalss, no response

## 2021-02-23 DIAGNOSIS — F4311 Post-traumatic stress disorder, acute: Secondary | ICD-10-CM | POA: Diagnosis not present

## 2021-02-23 DIAGNOSIS — F330 Major depressive disorder, recurrent, mild: Secondary | ICD-10-CM | POA: Diagnosis not present

## 2021-02-23 DIAGNOSIS — F4010 Social phobia, unspecified: Secondary | ICD-10-CM | POA: Diagnosis not present

## 2021-02-23 DIAGNOSIS — F410 Panic disorder [episodic paroxysmal anxiety] without agoraphobia: Secondary | ICD-10-CM | POA: Diagnosis not present

## 2021-02-25 DIAGNOSIS — Z20822 Contact with and (suspected) exposure to covid-19: Secondary | ICD-10-CM | POA: Diagnosis not present

## 2021-03-01 DIAGNOSIS — F330 Major depressive disorder, recurrent, mild: Secondary | ICD-10-CM | POA: Diagnosis not present

## 2021-03-01 DIAGNOSIS — F4311 Post-traumatic stress disorder, acute: Secondary | ICD-10-CM | POA: Diagnosis not present

## 2021-03-01 DIAGNOSIS — N764 Abscess of vulva: Secondary | ICD-10-CM | POA: Diagnosis not present

## 2021-03-01 DIAGNOSIS — F4010 Social phobia, unspecified: Secondary | ICD-10-CM | POA: Diagnosis not present

## 2021-03-01 DIAGNOSIS — F410 Panic disorder [episodic paroxysmal anxiety] without agoraphobia: Secondary | ICD-10-CM | POA: Diagnosis not present

## 2021-03-08 DIAGNOSIS — F410 Panic disorder [episodic paroxysmal anxiety] without agoraphobia: Secondary | ICD-10-CM | POA: Diagnosis not present

## 2021-03-08 DIAGNOSIS — F330 Major depressive disorder, recurrent, mild: Secondary | ICD-10-CM | POA: Diagnosis not present

## 2021-03-08 DIAGNOSIS — F4311 Post-traumatic stress disorder, acute: Secondary | ICD-10-CM | POA: Diagnosis not present

## 2021-03-08 DIAGNOSIS — F4010 Social phobia, unspecified: Secondary | ICD-10-CM | POA: Diagnosis not present

## 2021-03-12 DIAGNOSIS — F410 Panic disorder [episodic paroxysmal anxiety] without agoraphobia: Secondary | ICD-10-CM | POA: Diagnosis not present

## 2021-03-12 DIAGNOSIS — F39 Unspecified mood [affective] disorder: Secondary | ICD-10-CM | POA: Diagnosis not present

## 2021-03-15 DIAGNOSIS — F4311 Post-traumatic stress disorder, acute: Secondary | ICD-10-CM | POA: Diagnosis not present

## 2021-03-15 DIAGNOSIS — F4010 Social phobia, unspecified: Secondary | ICD-10-CM | POA: Diagnosis not present

## 2021-03-15 DIAGNOSIS — F330 Major depressive disorder, recurrent, mild: Secondary | ICD-10-CM | POA: Diagnosis not present

## 2021-03-15 DIAGNOSIS — F410 Panic disorder [episodic paroxysmal anxiety] without agoraphobia: Secondary | ICD-10-CM | POA: Diagnosis not present

## 2021-03-22 DIAGNOSIS — F4010 Social phobia, unspecified: Secondary | ICD-10-CM | POA: Diagnosis not present

## 2021-03-22 DIAGNOSIS — F330 Major depressive disorder, recurrent, mild: Secondary | ICD-10-CM | POA: Diagnosis not present

## 2021-03-22 DIAGNOSIS — F410 Panic disorder [episodic paroxysmal anxiety] without agoraphobia: Secondary | ICD-10-CM | POA: Diagnosis not present

## 2021-03-22 DIAGNOSIS — F4311 Post-traumatic stress disorder, acute: Secondary | ICD-10-CM | POA: Diagnosis not present

## 2021-03-28 DIAGNOSIS — L700 Acne vulgaris: Secondary | ICD-10-CM | POA: Diagnosis not present

## 2021-03-28 DIAGNOSIS — L732 Hidradenitis suppurativa: Secondary | ICD-10-CM | POA: Diagnosis not present

## 2021-03-29 DIAGNOSIS — F4010 Social phobia, unspecified: Secondary | ICD-10-CM | POA: Diagnosis not present

## 2021-03-29 DIAGNOSIS — F4311 Post-traumatic stress disorder, acute: Secondary | ICD-10-CM | POA: Diagnosis not present

## 2021-03-29 DIAGNOSIS — F410 Panic disorder [episodic paroxysmal anxiety] without agoraphobia: Secondary | ICD-10-CM | POA: Diagnosis not present

## 2021-03-29 DIAGNOSIS — F330 Major depressive disorder, recurrent, mild: Secondary | ICD-10-CM | POA: Diagnosis not present

## 2021-04-05 DIAGNOSIS — F4010 Social phobia, unspecified: Secondary | ICD-10-CM | POA: Diagnosis not present

## 2021-04-05 DIAGNOSIS — F330 Major depressive disorder, recurrent, mild: Secondary | ICD-10-CM | POA: Diagnosis not present

## 2021-04-05 DIAGNOSIS — F4311 Post-traumatic stress disorder, acute: Secondary | ICD-10-CM | POA: Diagnosis not present

## 2021-04-05 DIAGNOSIS — F410 Panic disorder [episodic paroxysmal anxiety] without agoraphobia: Secondary | ICD-10-CM | POA: Diagnosis not present

## 2021-04-12 DIAGNOSIS — F4010 Social phobia, unspecified: Secondary | ICD-10-CM | POA: Diagnosis not present

## 2021-04-12 DIAGNOSIS — F330 Major depressive disorder, recurrent, mild: Secondary | ICD-10-CM | POA: Diagnosis not present

## 2021-04-12 DIAGNOSIS — F410 Panic disorder [episodic paroxysmal anxiety] without agoraphobia: Secondary | ICD-10-CM | POA: Diagnosis not present

## 2021-04-12 DIAGNOSIS — F4311 Post-traumatic stress disorder, acute: Secondary | ICD-10-CM | POA: Diagnosis not present

## 2021-04-13 DIAGNOSIS — U071 COVID-19: Secondary | ICD-10-CM | POA: Diagnosis not present

## 2021-04-20 ENCOUNTER — Other Ambulatory Visit: Payer: Self-pay | Admitting: Student

## 2021-04-20 MED ORDER — TICAGRELOR 90 MG PO TABS: 90.0000 mg | ORAL_TABLET | Freq: Two times a day (BID) | ORAL | 4 refills | Status: DC

## 2021-04-26 ENCOUNTER — Ambulatory Visit: Payer: BC Managed Care – PPO | Admitting: Endocrinology

## 2021-04-27 ENCOUNTER — Other Ambulatory Visit: Payer: Self-pay | Admitting: Cardiovascular Disease

## 2021-04-27 DIAGNOSIS — F410 Panic disorder [episodic paroxysmal anxiety] without agoraphobia: Secondary | ICD-10-CM | POA: Diagnosis not present

## 2021-04-27 DIAGNOSIS — F4311 Post-traumatic stress disorder, acute: Secondary | ICD-10-CM | POA: Diagnosis not present

## 2021-04-27 DIAGNOSIS — F330 Major depressive disorder, recurrent, mild: Secondary | ICD-10-CM | POA: Diagnosis not present

## 2021-04-27 DIAGNOSIS — F4010 Social phobia, unspecified: Secondary | ICD-10-CM | POA: Diagnosis not present

## 2021-05-01 DIAGNOSIS — F410 Panic disorder [episodic paroxysmal anxiety] without agoraphobia: Secondary | ICD-10-CM | POA: Diagnosis not present

## 2021-05-01 DIAGNOSIS — F4010 Social phobia, unspecified: Secondary | ICD-10-CM | POA: Diagnosis not present

## 2021-05-01 DIAGNOSIS — F4311 Post-traumatic stress disorder, acute: Secondary | ICD-10-CM | POA: Diagnosis not present

## 2021-05-01 DIAGNOSIS — F330 Major depressive disorder, recurrent, mild: Secondary | ICD-10-CM | POA: Diagnosis not present

## 2021-05-07 DIAGNOSIS — H35033 Hypertensive retinopathy, bilateral: Secondary | ICD-10-CM | POA: Diagnosis not present

## 2021-05-07 DIAGNOSIS — H35371 Puckering of macula, right eye: Secondary | ICD-10-CM | POA: Diagnosis not present

## 2021-05-07 DIAGNOSIS — H43823 Vitreomacular adhesion, bilateral: Secondary | ICD-10-CM | POA: Diagnosis not present

## 2021-05-07 DIAGNOSIS — E103513 Type 1 diabetes mellitus with proliferative diabetic retinopathy with macular edema, bilateral: Secondary | ICD-10-CM | POA: Diagnosis not present

## 2021-05-08 DIAGNOSIS — F330 Major depressive disorder, recurrent, mild: Secondary | ICD-10-CM | POA: Diagnosis not present

## 2021-05-08 DIAGNOSIS — F4010 Social phobia, unspecified: Secondary | ICD-10-CM | POA: Diagnosis not present

## 2021-05-08 DIAGNOSIS — F4311 Post-traumatic stress disorder, acute: Secondary | ICD-10-CM | POA: Diagnosis not present

## 2021-05-08 DIAGNOSIS — F410 Panic disorder [episodic paroxysmal anxiety] without agoraphobia: Secondary | ICD-10-CM | POA: Diagnosis not present

## 2021-05-17 DIAGNOSIS — F330 Major depressive disorder, recurrent, mild: Secondary | ICD-10-CM | POA: Diagnosis not present

## 2021-05-17 DIAGNOSIS — F410 Panic disorder [episodic paroxysmal anxiety] without agoraphobia: Secondary | ICD-10-CM | POA: Diagnosis not present

## 2021-05-17 DIAGNOSIS — F4311 Post-traumatic stress disorder, acute: Secondary | ICD-10-CM | POA: Diagnosis not present

## 2021-05-17 DIAGNOSIS — F4010 Social phobia, unspecified: Secondary | ICD-10-CM | POA: Diagnosis not present

## 2021-05-22 ENCOUNTER — Ambulatory Visit (INDEPENDENT_AMBULATORY_CARE_PROVIDER_SITE_OTHER): Payer: BC Managed Care – PPO | Admitting: Endocrinology

## 2021-05-22 ENCOUNTER — Encounter: Payer: Self-pay | Admitting: Endocrinology

## 2021-05-22 ENCOUNTER — Other Ambulatory Visit: Payer: Self-pay

## 2021-05-22 VITALS — BP 100/60 | HR 73 | Ht 68.0 in | Wt 194.4 lb

## 2021-05-22 DIAGNOSIS — E109 Type 1 diabetes mellitus without complications: Secondary | ICD-10-CM

## 2021-05-22 DIAGNOSIS — Z23 Encounter for immunization: Secondary | ICD-10-CM | POA: Diagnosis not present

## 2021-05-22 LAB — POCT GLYCOSYLATED HEMOGLOBIN (HGB A1C): Hemoglobin A1C: 8.8 % — AB (ref 4.0–5.6)

## 2021-05-22 NOTE — Progress Notes (Signed)
Subjective:    Patient ID: Alicia Phillips, female    DOB: 01/21/1981, 40 y.o.   MRN: [id number]  HPI Pt returns for f/u of diabetes mellitus:   DM type: 1 Dx'ed: 1986 Complications: DR and CAD Therapy: insulin since dx GDM: never DKA: once (2003) Severe hypoglycemia: never Pancreatitis: never Pancreatic imaging: never SDOH: none Other: she takes multiple daily injections; She has [place] continuous glucose monitor; She took pump rx 1994-2000.  She stopped, as she feels it caused excess eating; She eats meals at 8AM, 2PM, and 8PM.   Interval history: She takes [place] 27 units qd, and Novolog 1 unit/6 grams with breakfast and lunch, and 1 unit 12 grams at supper (and the same extra units), for a total of approx 18 units per day.   She has mild hypoglycemia approx QOD.  I reviewed continuous glucose monitor data.  Glucose varies from 75-290.  It is in general highest at [facility].  it is lowest 9AM-1PM, and 5-7PM.  She has mild hypoglycemia approx qd.  Pt says she does not miss the insulin.   Past Medical History:  Diagnosis Date   Anxiety    Bipolar disorder (HCC)    CAD (coronary artery disease)    a. LHC 04/05/20: 95% stenosis of mid LAD s/p DES, 30% stenosis of proximal to mid LAD   Diabetes mellitus type 1 (HCC)    History of borderline personality disorder    Hyperlipidemia    Major depression    STEMI (ST elevation myocardial infarction) (HCC) 04/05/2020   Tobacco use     Past Surgical History:  Procedure Laterality Date   CORONARY STENT INTERVENTION N/A 04/05/2020   Procedure: CORONARY STENT INTERVENTION;  Surgeon: Iran Ouch, MD;  Location: [facility];  Service: Cardiovascular;  Laterality: N/A;   LEFT HEART CATH AND CORONARY ANGIOGRAPHY N/A 04/05/2020   Procedure: LEFT HEART CATH AND CORONARY ANGIOGRAPHY;  Surgeon: Iran Ouch, MD;  Location: [facility];  Service: Cardiovascular;  Laterality: N/A;    Social History   Socioeconomic History    Marital status: Single    Spouse name: Not on file   Number of children: Not on file   Years of education: Not on file   Highest education level: Not on file  Occupational History   Not on file  Tobacco Use   Smoking status: Former    Types: Cigarettes   Smokeless tobacco: Never   Tobacco comments:    patient stated she was smoking but has quit since 05/30/20  Vaping Use   Vaping Use: Never used  Substance and Sexual Activity   Alcohol use: Never   Drug use: Never   Sexual activity: Not on file  Other Topics Concern   Not on file  Social History Narrative   Not on file   Social Determinants of Health   Financial Resource Strain: Not on file  Food Insecurity: Not on file  Transportation Needs: Not on file  Physical Activity: Not on file  Stress: Not on file  Social Connections: Not on file  Intimate Partner Violence: Not on file    Current Outpatient Medications on File Prior to Visit  Medication Sig Dispense Refill   ACETAMINOPHEN EXTRA STRENGTH 500 MG tablet Take 500 mg by mouth every 4 (four) hours as needed for mild pain or headache.      acetone, urine, test strip 1 strip by Does not apply route as needed for high blood sugar. 25  each 0   aspirin 81 MG chewable tablet Chew 1 tablet (81 mg total) by mouth daily.     atorvastatin (LIPITOR) 80 MG tablet TAKE 1 TABLET(80 MG) BY MOUTH DAILY 90 tablet 1   Continuous Blood Gluc Sensor (DEXCOM G6 SENSOR) MISC 1 Device by Does not apply route See admin instructions. Change every 10 days 9 each 3   fluconazole (DIFLUCAN) 150 MG tablet Take 1 tablet (150 mg total) by mouth daily. 3 tablet 5   FLUoxetine (PROZAC) 20 MG capsule Take 20 mg by mouth daily.     INPEN 100-PINK-NOVO DEVI 3 (three) times daily.     insulin aspart (NOVOLOG) cartridge As directed, for a total of 20 units/day 18 mL 11   lamoTRIgine (LAMICTAL) 100 MG tablet Take 1 tablet (100 mg total) by mouth at bedtime. Take one tablet at bedtime. 30 tablet 1    metoprolol succinate (TOPROL-XL) 25 MG 24 hr tablet TAKE 1 TABLET(25 MG) BY MOUTH IN THE MORNING AND AT BEDTIME 180 tablet 2   METOPROLOL TARTRATE PO Take 1 tablet by mouth in the morning and at bedtime.     NOVOFINE PEN NEEDLE 32G X 6 MM MISC SMARTSIG:Injection 5 Times Daily     ticagrelor (BRILINTA) 90 MG TABS tablet Take 1 tablet (90 mg total) by mouth 2 (two) times daily. 60 tablet 4   TRESIBA FLEXTOUCH 100 UNIT/ML FlexTouch Pen Inject 28 Units into the skin daily. 30 mL 3   nitroGLYCERIN (NITROSTAT) 0.4 MG SL tablet Place 1 tablet (0.4 mg total) under the tongue every 5 (five) minutes as needed for chest pain. 25 tablet 2   No current facility-administered medications on file prior to visit.    Allergies  Allergen Reactions   Sulfamethoxazole-Trimethoprim Rash    Family History  Problem Relation Age of Onset   Depression Father    Breast cancer Maternal Grandmother    Depression Brother    Anxiety disorder Brother    ADD / ADHD Brother    Alcohol abuse Brother    Alcohol abuse Maternal Grandfather    Diabetes Neg Hx     BP 100/60 (BP Location: Right Arm, Patient Position: Sitting, Cuff Size: Large)   Pulse 73   Ht 5\' 8"  (1.727 m)   Wt 194 lb 6.4 oz (88.2 kg)   SpO2 98%   BMI 29.56 kg/m    Review of Systems     Objective:   Physical Exam   A1c=8.8%    Assessment & Plan:  Type 1 DM: uncontrolled   Patient Instructions  Please increase the Tresiba to 28 units per day, and:  change the Novolog to 1 unit/6 grams with breakfast and supper, and 1 unit 12 grams at lunch.   Please come back for a follow-up appointment in 3 months.

## 2021-05-22 NOTE — Patient Instructions (Addendum)
Please increase the Tresiba to 28 units per day, and:  change the Novolog to 1 unit/6 grams with breakfast and supper, and 1 unit 12 grams at lunch.   Please come back for a follow-up appointment in 3 months.

## 2021-05-24 DIAGNOSIS — F4010 Social phobia, unspecified: Secondary | ICD-10-CM | POA: Diagnosis not present

## 2021-05-24 DIAGNOSIS — F330 Major depressive disorder, recurrent, mild: Secondary | ICD-10-CM | POA: Diagnosis not present

## 2021-05-24 DIAGNOSIS — F410 Panic disorder [episodic paroxysmal anxiety] without agoraphobia: Secondary | ICD-10-CM | POA: Diagnosis not present

## 2021-05-24 DIAGNOSIS — F4311 Post-traumatic stress disorder, acute: Secondary | ICD-10-CM | POA: Diagnosis not present

## 2021-05-25 DIAGNOSIS — N764 Abscess of vulva: Secondary | ICD-10-CM | POA: Diagnosis not present

## 2021-05-25 DIAGNOSIS — L02421 Furuncle of right axilla: Secondary | ICD-10-CM | POA: Diagnosis not present

## 2021-05-30 DIAGNOSIS — F330 Major depressive disorder, recurrent, mild: Secondary | ICD-10-CM | POA: Diagnosis not present

## 2021-05-30 DIAGNOSIS — F410 Panic disorder [episodic paroxysmal anxiety] without agoraphobia: Secondary | ICD-10-CM | POA: Diagnosis not present

## 2021-05-30 DIAGNOSIS — F4010 Social phobia, unspecified: Secondary | ICD-10-CM | POA: Diagnosis not present

## 2021-05-30 DIAGNOSIS — F4311 Post-traumatic stress disorder, acute: Secondary | ICD-10-CM | POA: Diagnosis not present

## 2021-06-11 ENCOUNTER — Other Ambulatory Visit: Payer: Self-pay | Admitting: Cardiovascular Disease

## 2021-06-12 DIAGNOSIS — E1059 Type 1 diabetes mellitus with other circulatory complications: Secondary | ICD-10-CM | POA: Diagnosis not present

## 2021-06-12 DIAGNOSIS — F39 Unspecified mood [affective] disorder: Secondary | ICD-10-CM | POA: Diagnosis not present

## 2021-06-12 DIAGNOSIS — F410 Panic disorder [episodic paroxysmal anxiety] without agoraphobia: Secondary | ICD-10-CM | POA: Diagnosis not present

## 2021-06-18 ENCOUNTER — Other Ambulatory Visit: Payer: Self-pay

## 2021-06-18 DIAGNOSIS — E109 Type 1 diabetes mellitus without complications: Secondary | ICD-10-CM

## 2021-06-18 MED ORDER — ACETONE (URINE) TEST VI STRP
1.0000 | ORAL_STRIP | 0 refills | Status: AC | PRN
Start: 2021-06-18 — End: ?

## 2021-06-26 DIAGNOSIS — F410 Panic disorder [episodic paroxysmal anxiety] without agoraphobia: Secondary | ICD-10-CM | POA: Diagnosis not present

## 2021-06-26 DIAGNOSIS — E1059 Type 1 diabetes mellitus with other circulatory complications: Secondary | ICD-10-CM | POA: Diagnosis not present

## 2021-06-26 DIAGNOSIS — F39 Unspecified mood [affective] disorder: Secondary | ICD-10-CM | POA: Diagnosis not present

## 2021-07-11 ENCOUNTER — Other Ambulatory Visit: Payer: Self-pay | Admitting: Endocrinology

## 2021-07-13 ENCOUNTER — Telehealth: Payer: Self-pay

## 2021-07-13 ENCOUNTER — Telehealth: Payer: Self-pay | Admitting: Endocrinology

## 2021-07-13 ENCOUNTER — Other Ambulatory Visit (HOSPITAL_COMMUNITY): Payer: Self-pay

## 2021-07-13 NOTE — Telephone Encounter (Signed)
Patient Advocate Encounter   Received notification from [facility] that prior authorization for Dexcom G6 Sensors is required by his/her insurance [employer].   PA submitted on 07/13/21  Key#: [id number]  Status is pending    [facility] will continue to follow:  Patient Advocate Fax:  [phone]

## 2021-07-13 NOTE — Telephone Encounter (Signed)
PT called need Prior Approval for insurance to provide coverage. Continuous Blood Gluc Sensor (DEXCOM G6 SENSOR) MISC  Please forward to pharmacy: [id number] [place], [place] - [address] AT [facility] [address] [facility] [address] Phone:  [phone]  Fax:  [phone]

## 2021-07-16 ENCOUNTER — Other Ambulatory Visit (HOSPITAL_COMMUNITY): Payer: Self-pay

## 2021-07-16 NOTE — Telephone Encounter (Signed)
Patient Advocate Encounter  Prior Authorization for [employer] has been approved.    PA#: [id number]  Effective dates: 07/13/21 through 07/13/22  Per Test Claim Patients co-pay is RTS.   Spoke with Pharmacy to Process.  Patient Advocate Fax:  [phone]

## 2021-07-31 ENCOUNTER — Other Ambulatory Visit: Payer: Self-pay

## 2021-07-31 DIAGNOSIS — E109 Type 1 diabetes mellitus without complications: Secondary | ICD-10-CM

## 2021-07-31 MED ORDER — INSULIN ASPART 100 UNIT/ML CARTRIDGE (PENFILL): SUBCUTANEOUS | 11 refills | Status: DC

## 2021-08-06 DIAGNOSIS — H35033 Hypertensive retinopathy, bilateral: Secondary | ICD-10-CM | POA: Diagnosis not present

## 2021-08-06 DIAGNOSIS — H43823 Vitreomacular adhesion, bilateral: Secondary | ICD-10-CM | POA: Diagnosis not present

## 2021-08-06 DIAGNOSIS — E103513 Type 1 diabetes mellitus with proliferative diabetic retinopathy with macular edema, bilateral: Secondary | ICD-10-CM | POA: Diagnosis not present

## 2021-08-06 DIAGNOSIS — H35371 Puckering of macula, right eye: Secondary | ICD-10-CM | POA: Diagnosis not present

## 2021-08-10 ENCOUNTER — Other Ambulatory Visit: Payer: Self-pay | Admitting: Student

## 2021-08-10 NOTE — Telephone Encounter (Signed)
This is Dr. Berry's pt 

## 2021-08-15 DIAGNOSIS — F4310 Post-traumatic stress disorder, unspecified: Secondary | ICD-10-CM | POA: Diagnosis not present

## 2021-08-21 ENCOUNTER — Other Ambulatory Visit: Payer: Self-pay | Admitting: Endocrinology

## 2021-08-23 NOTE — Telephone Encounter (Signed)
Message sen to pt thru MyChart tp F/U with her PCP for a refill on FLUCONAZOLE 150MG  TABLETS

## 2021-08-28 ENCOUNTER — Ambulatory Visit: Payer: BC Managed Care – PPO | Admitting: Endocrinology

## 2021-08-28 ENCOUNTER — Other Ambulatory Visit: Payer: Self-pay

## 2021-08-28 VITALS — BP 126/80 | HR 78 | Ht 68.0 in | Wt 197.2 lb

## 2021-08-28 DIAGNOSIS — E1065 Type 1 diabetes mellitus with hyperglycemia: Secondary | ICD-10-CM | POA: Diagnosis not present

## 2021-08-28 DIAGNOSIS — E109 Type 1 diabetes mellitus without complications: Secondary | ICD-10-CM

## 2021-08-28 LAB — POCT GLYCOSYLATED HEMOGLOBIN (HGB A1C): Hemoglobin A1C: 9.4 % — AB (ref 4.0–5.6)

## 2021-08-28 MED ORDER — TIRZEPATIDE 2.5 MG/0.5ML ~~LOC~~ SOAJ: 2.5000 mg | SUBCUTANEOUS | 3 refills | Status: DC

## 2021-08-28 NOTE — Progress Notes (Signed)
Subjective:    Patient ID: Alicia Phillips, female    DOB: Mar 25, 1981, 41 y.o.   MRN: [id number]  HPI Pt returns for f/u of diabetes mellitus:   DM type: 1 Dx'ed: [id number] Complications: DR and CAD Therapy: insulin since dx.   GDM: G0 DKA: once (2003) Severe hypoglycemia: never.   Pancreatitis: never Pancreatic imaging: never SDOH: none Other: she takes multiple daily injections; She has [place] CGM; She took pump rx 1994-2000.  She stopped, as she feels it caused excess eating; She eats meals at [place], and 8PM; she is not at risk for pregnancy.   Interval history: She takes [place] 28 units qd, and Novolog 1 unit/6 grams with breakfast and lunch, and 1 unit 12 grams at supper (and the same extra units), for a total of approx 18 units per day.   I reviewed continuous glucose monitor data.  Glucose varies from 68-290.  It increases 7PM-8AM, then decreases.  She averages a total of approx 20 units of Novolog per day.  Main symptom is weight gain.   Past Medical History:  Diagnosis Date   Anxiety    Bipolar disorder ([place])    CAD (coronary artery disease)    a. LHC 04/05/20: 95% stenosis of mid LAD s/p DES, 30% stenosis of proximal to mid LAD   Diabetes mellitus type 1 ([place])    History of borderline personality disorder    Hyperlipidemia    Major depression    STEMI (ST elevation myocardial infarction) ([place]) 04/05/2020   Tobacco use     Past Surgical History:  Procedure Laterality Date   CORONARY STENT INTERVENTION N/A 04/05/2020   Procedure: CORONARY STENT INTERVENTION;  Surgeon: Wellington Hampshire, MD;  Location: [facility];  Service: Cardiovascular;  Laterality: N/A;   LEFT HEART CATH AND CORONARY ANGIOGRAPHY N/A 04/05/2020   Procedure: LEFT HEART CATH AND CORONARY ANGIOGRAPHY;  Surgeon: Wellington Hampshire, MD;  Location: [place] [facility];  Service: Cardiovascular;  Laterality: N/A;    Social History   Socioeconomic History   Marital status: Single    Spouse name: Not on  file   Number of children: Not on file   Years of education: Not on file   Highest education level: Not on file  Occupational History   Not on file  Tobacco Use   Smoking status: Former    Types: Cigarettes   Smokeless tobacco: Never   Tobacco comments:    patient stated she was smoking but has quit since 05/30/20  Vaping Use   Vaping Use: Never used  Substance and Sexual Activity   Alcohol use: Never   Drug use: Never   Sexual activity: Not on file  Other Topics Concern   Not on file  Social History Narrative   Not on file   Social Determinants of Health   Financial Resource Strain: Not on file  Food Insecurity: Not on file  Transportation Needs: Not on file  Physical Activity: Not on file  Stress: Not on file  Social Connections: Not on file  Intimate Partner Violence: Not on file    Current Outpatient Medications on File Prior to Visit  Medication Sig Dispense Refill   ACETAMINOPHEN EXTRA STRENGTH 500 MG tablet Take 500 mg by mouth every 4 (four) hours as needed for mild pain or headache.      acetone, urine, test strip 1 strip by Does not apply route as needed for high blood sugar. 25 each 0  aspirin 81 MG chewable tablet Chew 1 tablet (81 mg total) by mouth daily.     atorvastatin (LIPITOR) 80 MG tablet TAKE 1 TABLET(80 MG) BY MOUTH DAILY 90 tablet 1   BRILINTA 90 MG TABS tablet TAKE 1 TABLET(90 MG) BY MOUTH TWICE DAILY 60 tablet 4   Continuous Blood Gluc Sensor (DEXCOM G6 SENSOR) MISC USE AS DIRECT6ED 9 each 3   fluconazole (DIFLUCAN) 150 MG tablet Take 1 tablet (150 mg total) by mouth daily. 3 tablet 5   FLUoxetine (PROZAC) 20 MG capsule Take 20 mg by mouth daily.     INPEN 100-PINK-NOVO DEVI 3 (three) times daily.     insulin aspart (NOVOLOG) cartridge As directed, for a total of 20 units/day 18 mL 11   lamoTRIgine (LAMICTAL) 100 MG tablet Take 1 tablet (100 mg total) by mouth at bedtime. Take one tablet at bedtime. 30 tablet 1   metoprolol succinate (TOPROL-XL)  25 MG 24 hr tablet TAKE 1 TABLET(25 MG) BY MOUTH IN THE MORNING AND AT BEDTIME 180 tablet 2   METOPROLOL TARTRATE PO Take 1 tablet by mouth in the morning and at bedtime.     nitroGLYCERIN (NITROSTAT) 0.4 MG SL tablet DISSOLVE ONE TABLET UNDER TONGUE AS NEEDED FOR CHEST PAIN EVERY 5 MINUTES 25 tablet 2   NOVOFINE PEN NEEDLE 32G X 6 MM MISC SMARTSIG:Injection 5 Times Daily     TRESIBA FLEXTOUCH 100 UNIT/ML FlexTouch Pen Inject 28 Units into the skin daily. 30 mL 3   No current facility-administered medications on file prior to visit.    Allergies  Allergen Reactions   Sulfamethoxazole-Trimethoprim Rash    Family History  Problem Relation Age of Onset   Depression Father    Breast cancer Maternal Grandmother    Depression Brother    Anxiety disorder Brother    ADD / ADHD Brother    Alcohol abuse Brother    Alcohol abuse Maternal Grandfather    Diabetes Neg Hx     BP 126/80    Pulse 78    Ht 5\' 8"  (1.727 m)    Wt 197 lb 3.2 oz (89.4 kg)    SpO2 97%    BMI 29.98 kg/m   Review of Systems She has mild hypoglycemia approx QOD.  This happens in the afternoon.      Objective:   Physical Exam    Lab Results  Component Value Date   HGBA1C 9.4 (A) 08/28/2021      Assessment & Plan:  Type 1 DM: uncontrolled.  We discussed.  She wants to pursue pump rx.    Patient Instructions  I have sent a prescription to your pharmacy, to add [facility].   Please continue the same [place] and Novolog.  However, you will need to be careful about low blood sugar, especially at first. Please see a pump trainer, to consider a pump.   Please come back for a follow-up appointment in 2 months.

## 2021-08-28 NOTE — Patient Instructions (Addendum)
I have sent a prescription to your pharmacy, to add [facility].   Please continue the same [place] and Novolog.  However, you will need to be careful about low blood sugar, especially at first. Please see a pump trainer, to consider a pump.   Please come back for a follow-up appointment in 2 months.

## 2021-08-29 ENCOUNTER — Telehealth: Payer: Self-pay

## 2021-08-29 NOTE — Telephone Encounter (Signed)
LVM for pt to cb to the office to give me an update on her message bc I was not able to understand all of it.

## 2021-09-03 ENCOUNTER — Encounter: Payer: Self-pay | Admitting: Endocrinology

## 2021-09-10 ENCOUNTER — Encounter: Payer: BC Managed Care – PPO | Attending: Endocrinology | Admitting: Nutrition

## 2021-09-10 ENCOUNTER — Other Ambulatory Visit: Payer: Self-pay

## 2021-09-10 DIAGNOSIS — Z713 Dietary counseling and surveillance: Secondary | ICD-10-CM | POA: Insufficient documentation

## 2021-09-10 DIAGNOSIS — E1065 Type 1 diabetes mellitus with hyperglycemia: Secondary | ICD-10-CM

## 2021-09-10 DIAGNOSIS — E109 Type 1 diabetes mellitus without complications: Secondary | ICD-10-CM | POA: Diagnosis not present

## 2021-09-11 NOTE — Progress Notes (Signed)
Patient is here today to discuss insulin pump therapy.  She reports being on a Medtronic pump about 10 years ago, but never took boluses due to a eating disorder.  She feels that she has this under control now, and would like to learn about the insulin pumps that are on the market now, and to take better control of her diabetes.  She is currently wearing a Dexcom G6 sensor and would link a pump that connects to this. She was shown all 3 pumps on the market, but we discussed the Tandem and Omnipod 3 that connect to the Dexcom.  We discussed the advantages and disadvantages of each model, and she was given a brochures on each model.  She was encouraged to go the the web sites, and their respective chat rooms to learn more from the people that are wearing them. We discussed the need to learn carb counting and the need for bolusing before each meal and snack.  She reported that she can do this.  Discussed how this will be able to help her with her excess weight gain, as well as to get her blood sugars under better control.   She was informed as to what steps will need to be taken to get each of the respective pump.  She had no final questions for me.

## 2021-09-11 NOTE — Patient Instructions (Addendum)
Read over brochures given and go to the web sites for more information on each pump.   Call if further questions on each model

## 2021-09-18 ENCOUNTER — Other Ambulatory Visit: Payer: Self-pay | Admitting: Endocrinology

## 2021-09-20 ENCOUNTER — Other Ambulatory Visit: Payer: Self-pay

## 2021-09-20 DIAGNOSIS — E109 Type 1 diabetes mellitus without complications: Secondary | ICD-10-CM

## 2021-09-20 MED ORDER — DEXCOM G6 SENSOR MISC: 3 refills | Status: DC

## 2021-09-21 ENCOUNTER — Other Ambulatory Visit: Payer: Self-pay | Admitting: Cardiovascular Disease

## 2021-09-22 ENCOUNTER — Other Ambulatory Visit: Payer: Self-pay | Admitting: Endocrinology

## 2021-09-25 ENCOUNTER — Other Ambulatory Visit: Payer: Self-pay | Admitting: Cardiovascular Disease

## 2021-09-27 ENCOUNTER — Other Ambulatory Visit: Payer: Self-pay

## 2021-10-08 IMAGING — CR DG CHEST 2V
2 series · 2 of 2 positions shown · non-contrast
Comparison: 04/04/2020

CLINICAL DATA: Chest pain and shortness of breath, tachycardia

EXAM:
CHEST - 2 VIEW

[w chest pa]
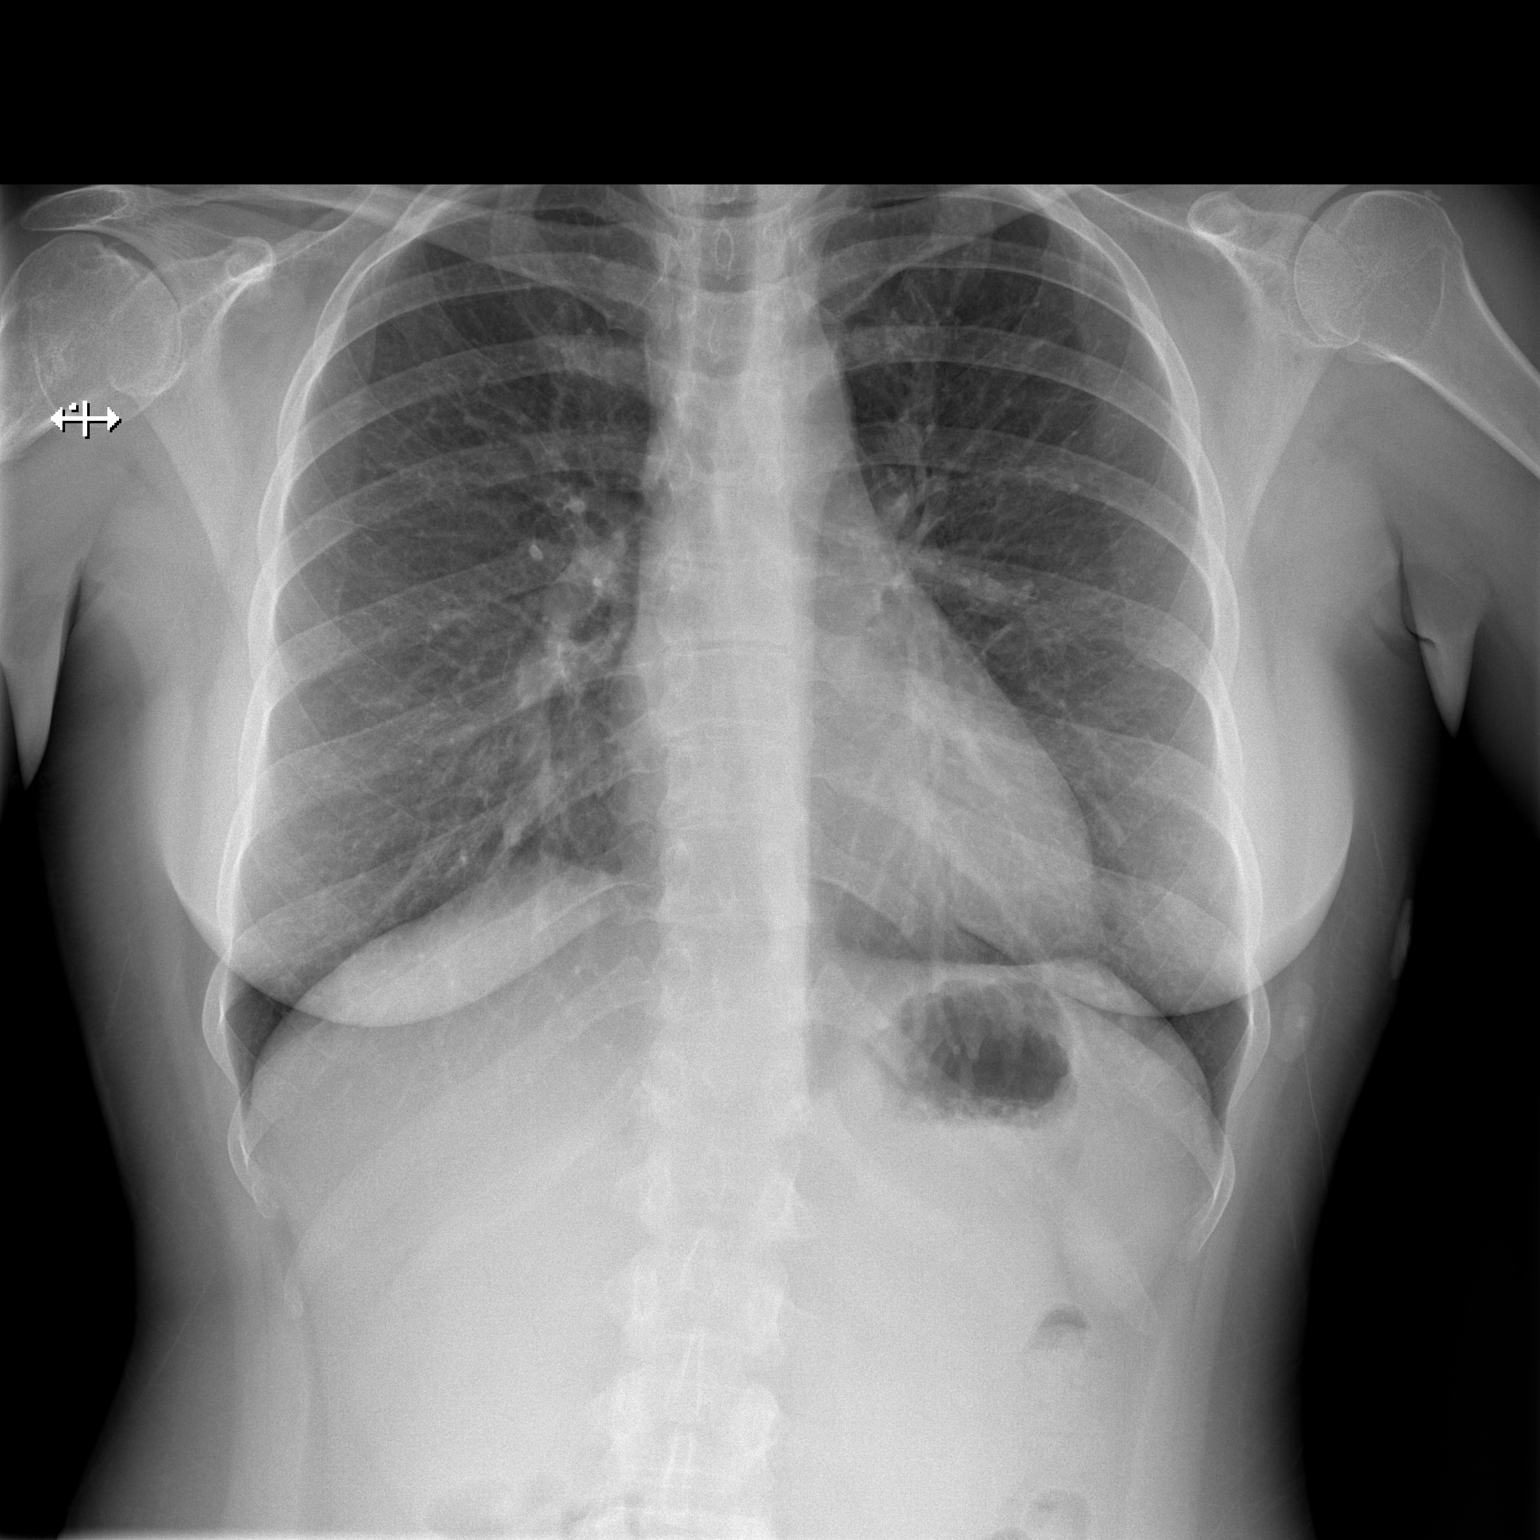

[w chest lat]
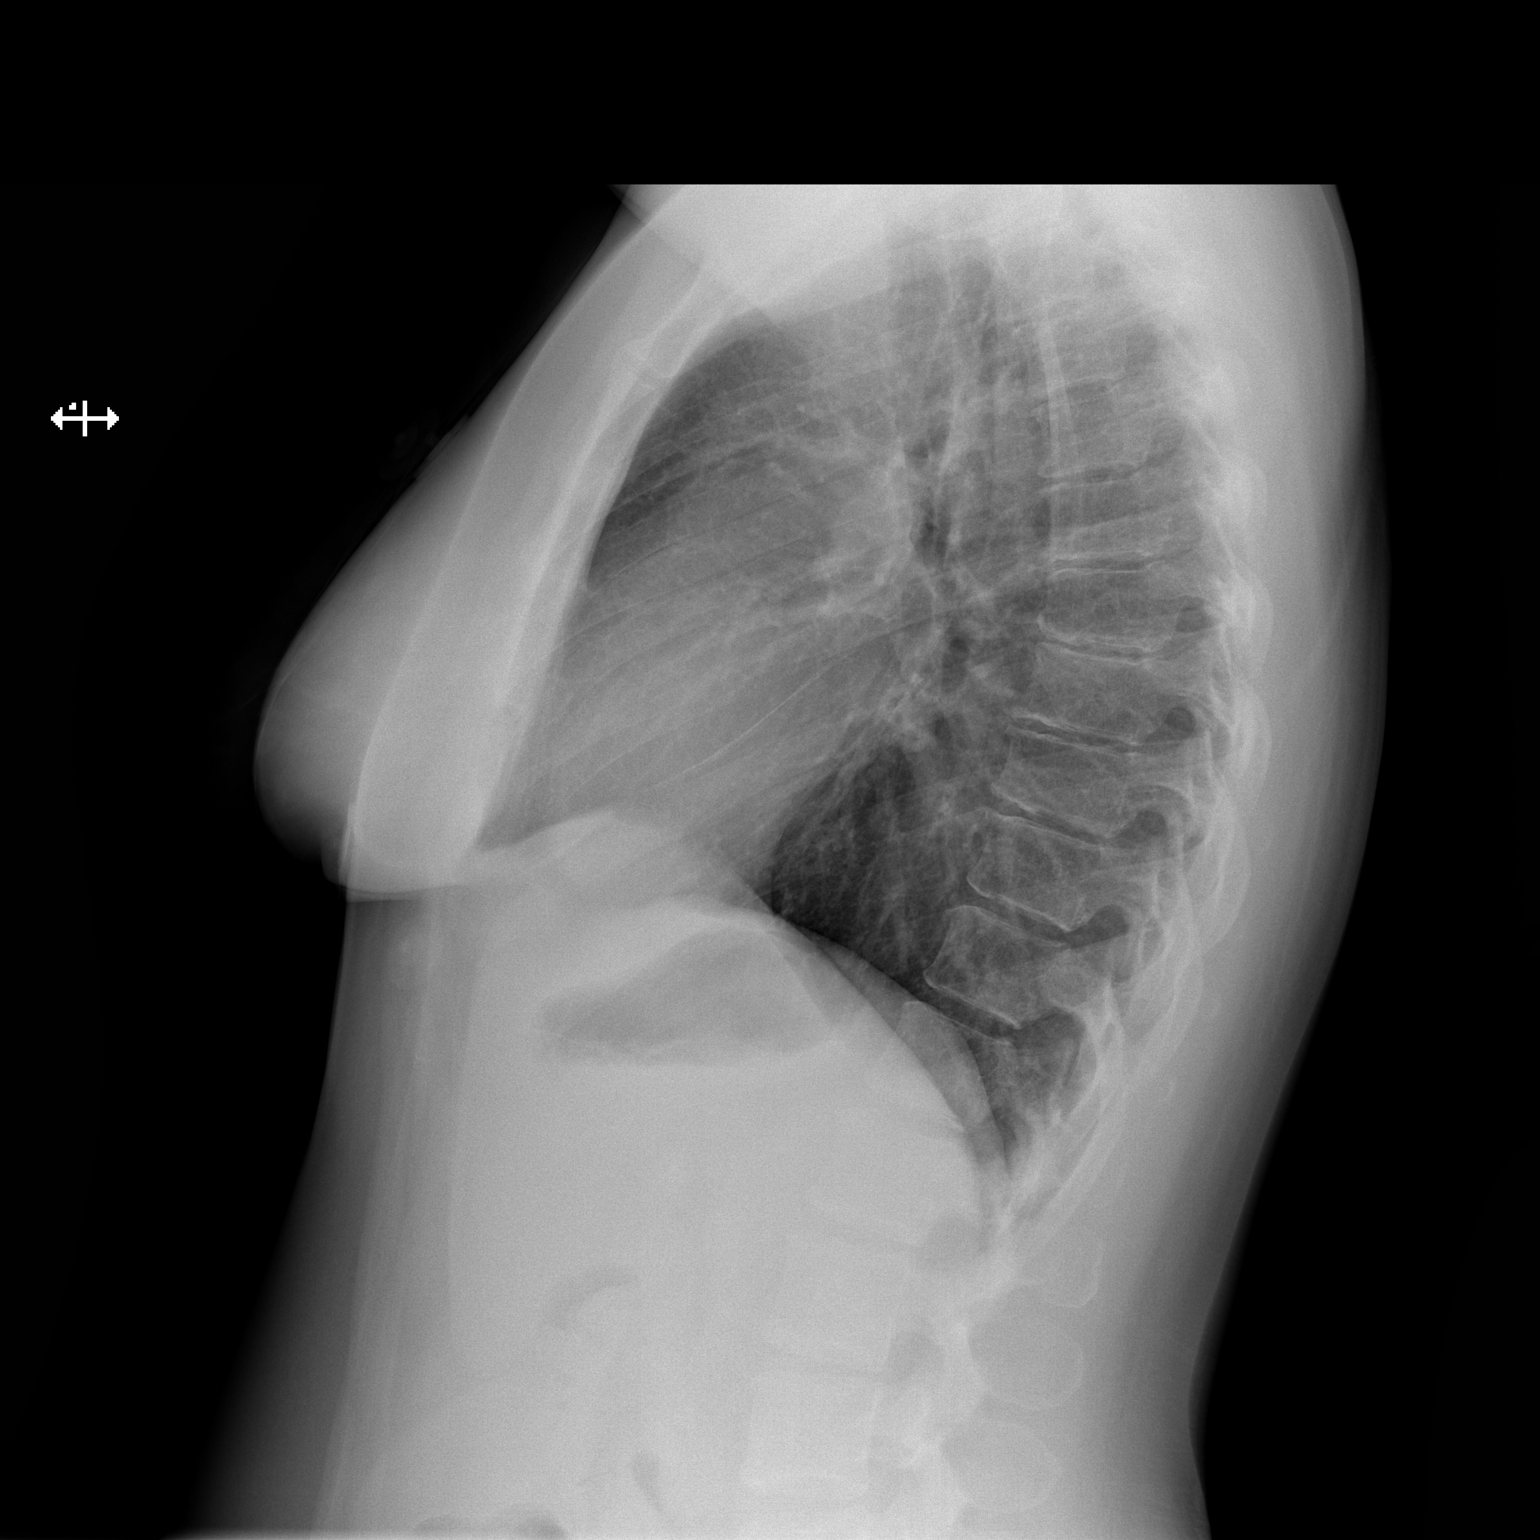

[2 of 2 positions shown; findings below may reference images not displayed]

FINDINGS: The heart size and mediastinal contours are within normal limits.
Both lungs are clear. The visualized skeletal structures are
unremarkable.
IMPRESSION: No active cardiopulmonary disease.

## 2021-10-08 IMAGING — CT CT ANGIO CHEST
3 of 7 series · 18 of 36 positions shown · IV contrast (omnipaque)
Comparison: Chest x-ray from earlier in the same day.

CLINICAL DATA: Palpitations and chest pain

EXAM:
CT ANGIOGRAPHY CHEST WITH CONTRAST
TECHNIQUE: Multidetector CT imaging of the chest was performed using the
standard protocol during bolus administration of intravenous
contrast. Multiplanar CT image reconstructions and MIPs were
obtained to evaluate the vascular anatomy.
CONTRAST:  75mL OMNIPAQUE IOHEXOL 350 MG/ML SOLN

[Series 5: thins · axial · 0.73mm/px · z∈[+1164,+1413]mm · 13 of 291 slices shown]
[im 21/291  lung]
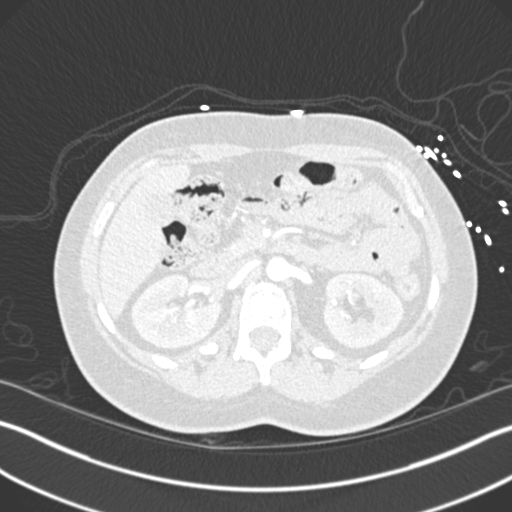
[im 42/291  mediastinal]
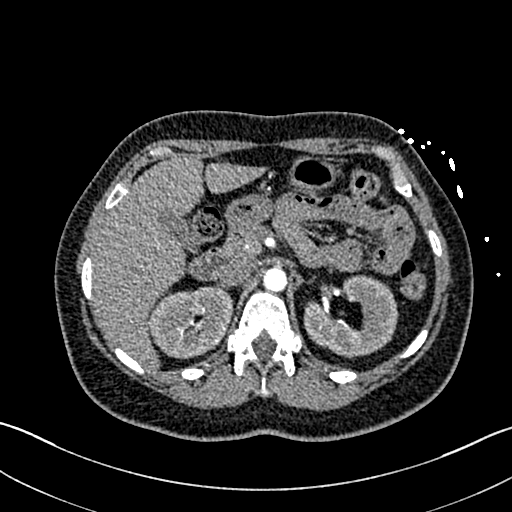
[im 63/291  lung]
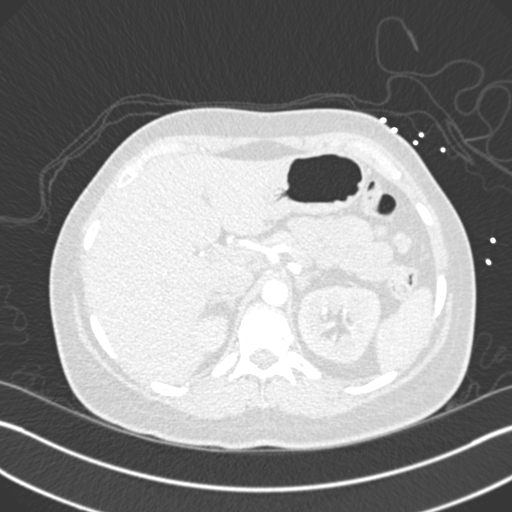
[im 83/291  mediastinal]
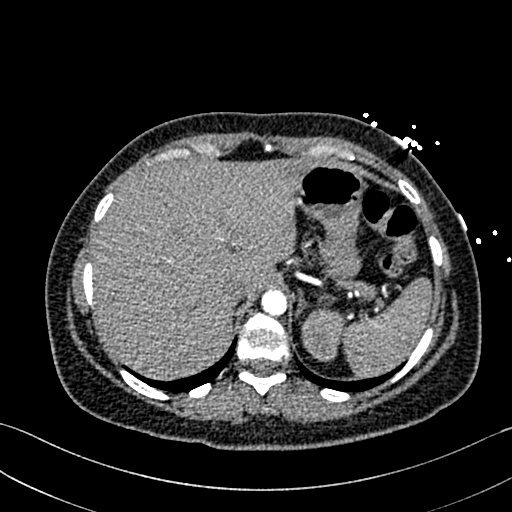
[im 104/291  lung]
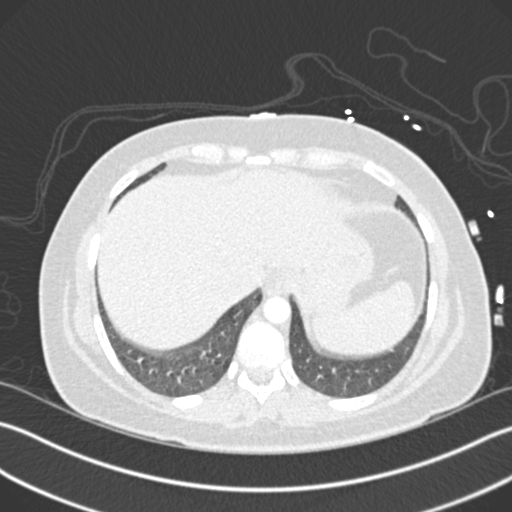
[im 125/291  mediastinal]
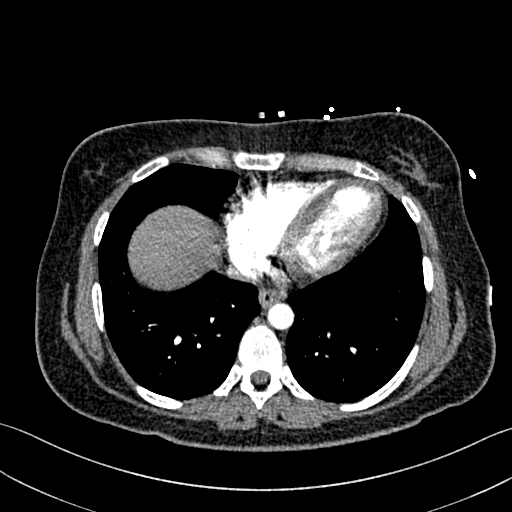
[im 146/291  lung]
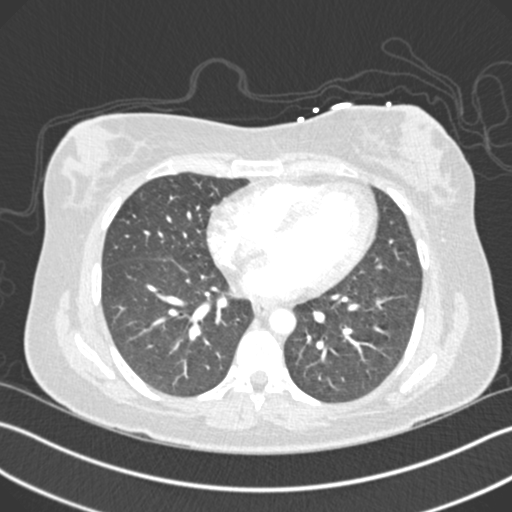
[im 166/291  mediastinal]
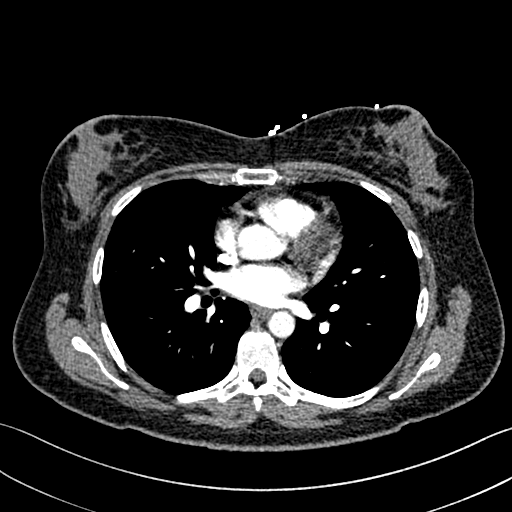
[im 187/291  lung]
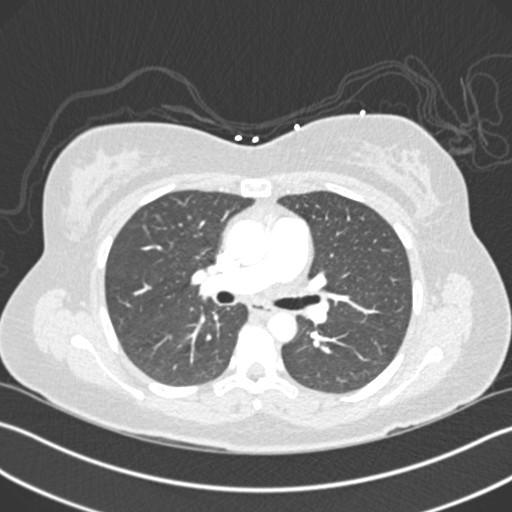
[im 208/291  mediastinal]
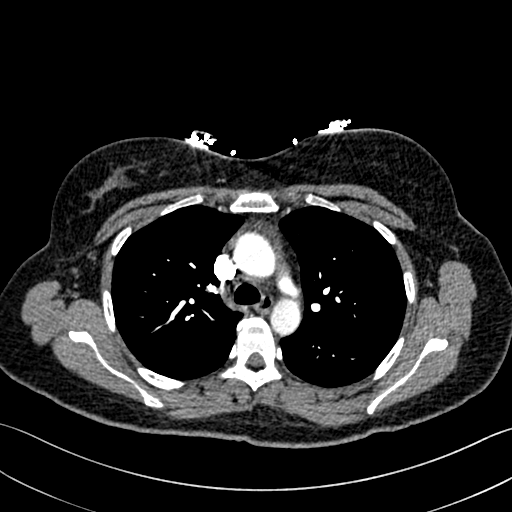
[im 228/291  lung]
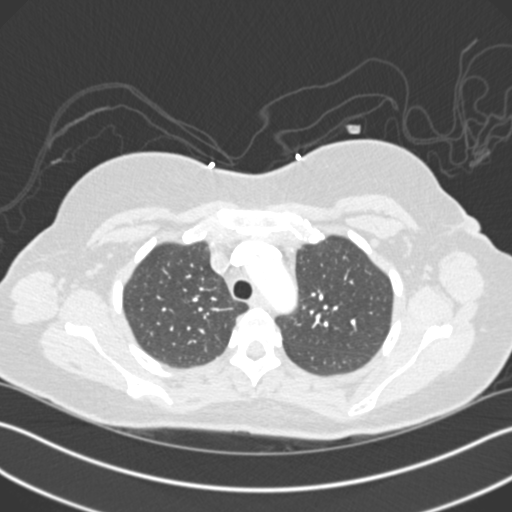
[im 249/291  mediastinal]
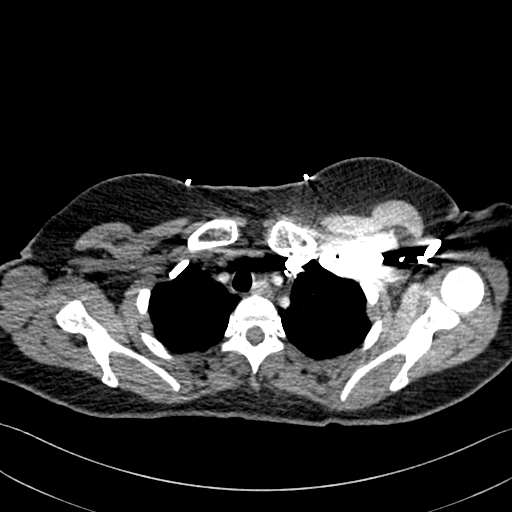
[im 270/291  lung]
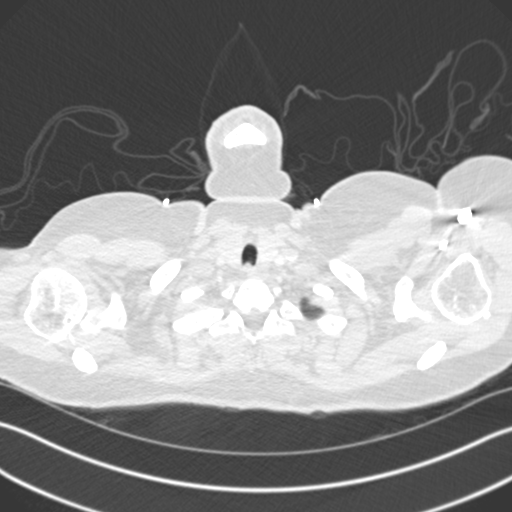

[Series 6: lung · axial · 0.64mm/px · z∈[+1240,+1384]mm · 4 of 122 slices shown]
[im 25/122  mediastinal]
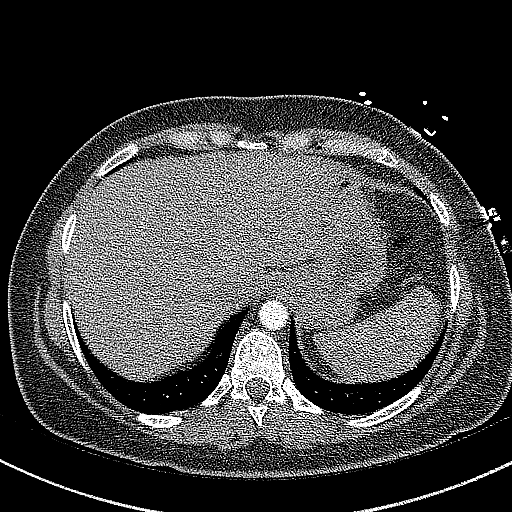
[im 49/122  mediastinal]
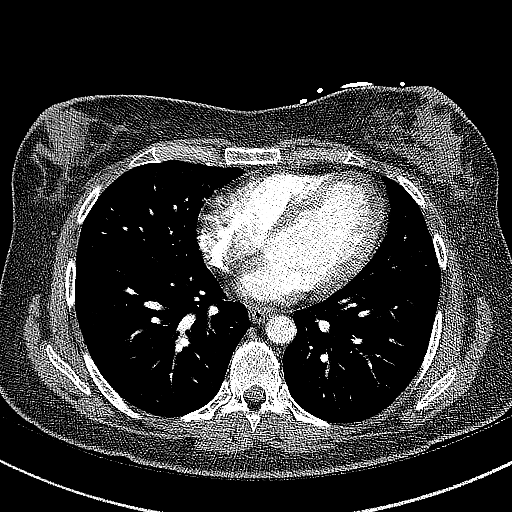
[im 73/122  mediastinal]
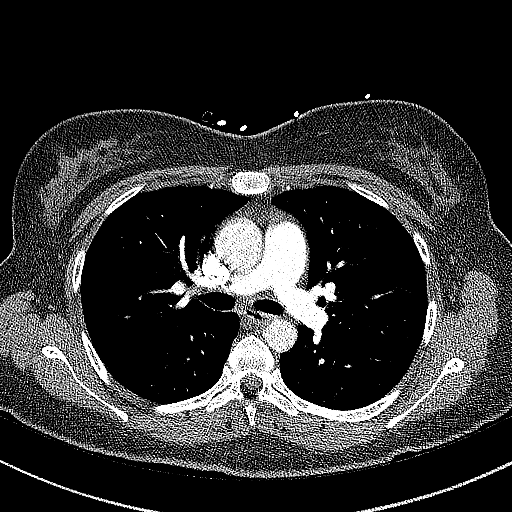
[im 97/122  mediastinal]
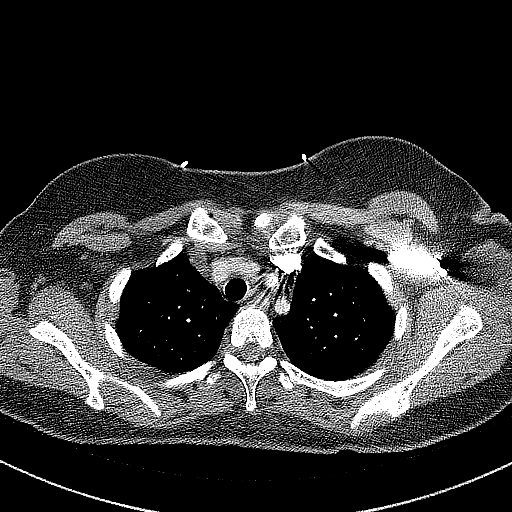

[Series 7: coronal mpr · coronal · 0.63mm/px · 1 of 154 slices shown]
[im 77/154  mediastinal]
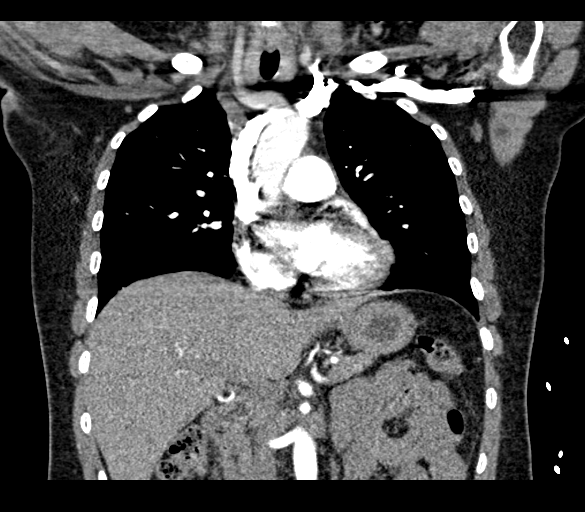

[18 of 36 positions shown; findings below may reference images not displayed]

FINDINGS: Cardiovascular: Thoracic aorta is within normal limits without
evidence of aneurysmal dilatation or dissection. No significant
atherosclerotic calcifications are seen. The pulmonary artery is
well visualized within normal branching pattern. No intraluminal
filling defect to suggest pulmonary embolus is identified. No
cardiac enlargement is seen. No coronary calcifications are noted.
No pericardial effusion is seen.

Mediastinum/Nodes: Thoracic inlet is within normal limits. No hilar
or mediastinal adenopathy is noted. The esophagus is within normal
limits as visualized.

Lungs/Pleura: Lungs are well aerated bilaterally. No focal
infiltrate or sizable effusion is seen. No parenchymal nodules are
noted.

Upper Abdomen: Visualized upper abdomen is within normal limits.

Musculoskeletal: No chest wall abnormality. No acute or significant
osseous findings.

Review of the MIP images confirms the above findings.
IMPRESSION: No evidence of pulmonary emboli.

No acute abnormality seen.

## 2021-10-24 ENCOUNTER — Other Ambulatory Visit: Payer: Self-pay | Admitting: Cardiovascular Disease

## 2021-10-26 ENCOUNTER — Ambulatory Visit: Payer: BC Managed Care – PPO | Admitting: Endocrinology

## 2021-11-04 IMAGING — CR DG CHEST 2V
2 series · 2 of 2 positions shown · non-contrast
Comparison: 04/11/2020

CLINICAL DATA: Chest pain and cardiac palpitations for 1 day

EXAM:
CHEST - 2 VIEW

[chest pa]
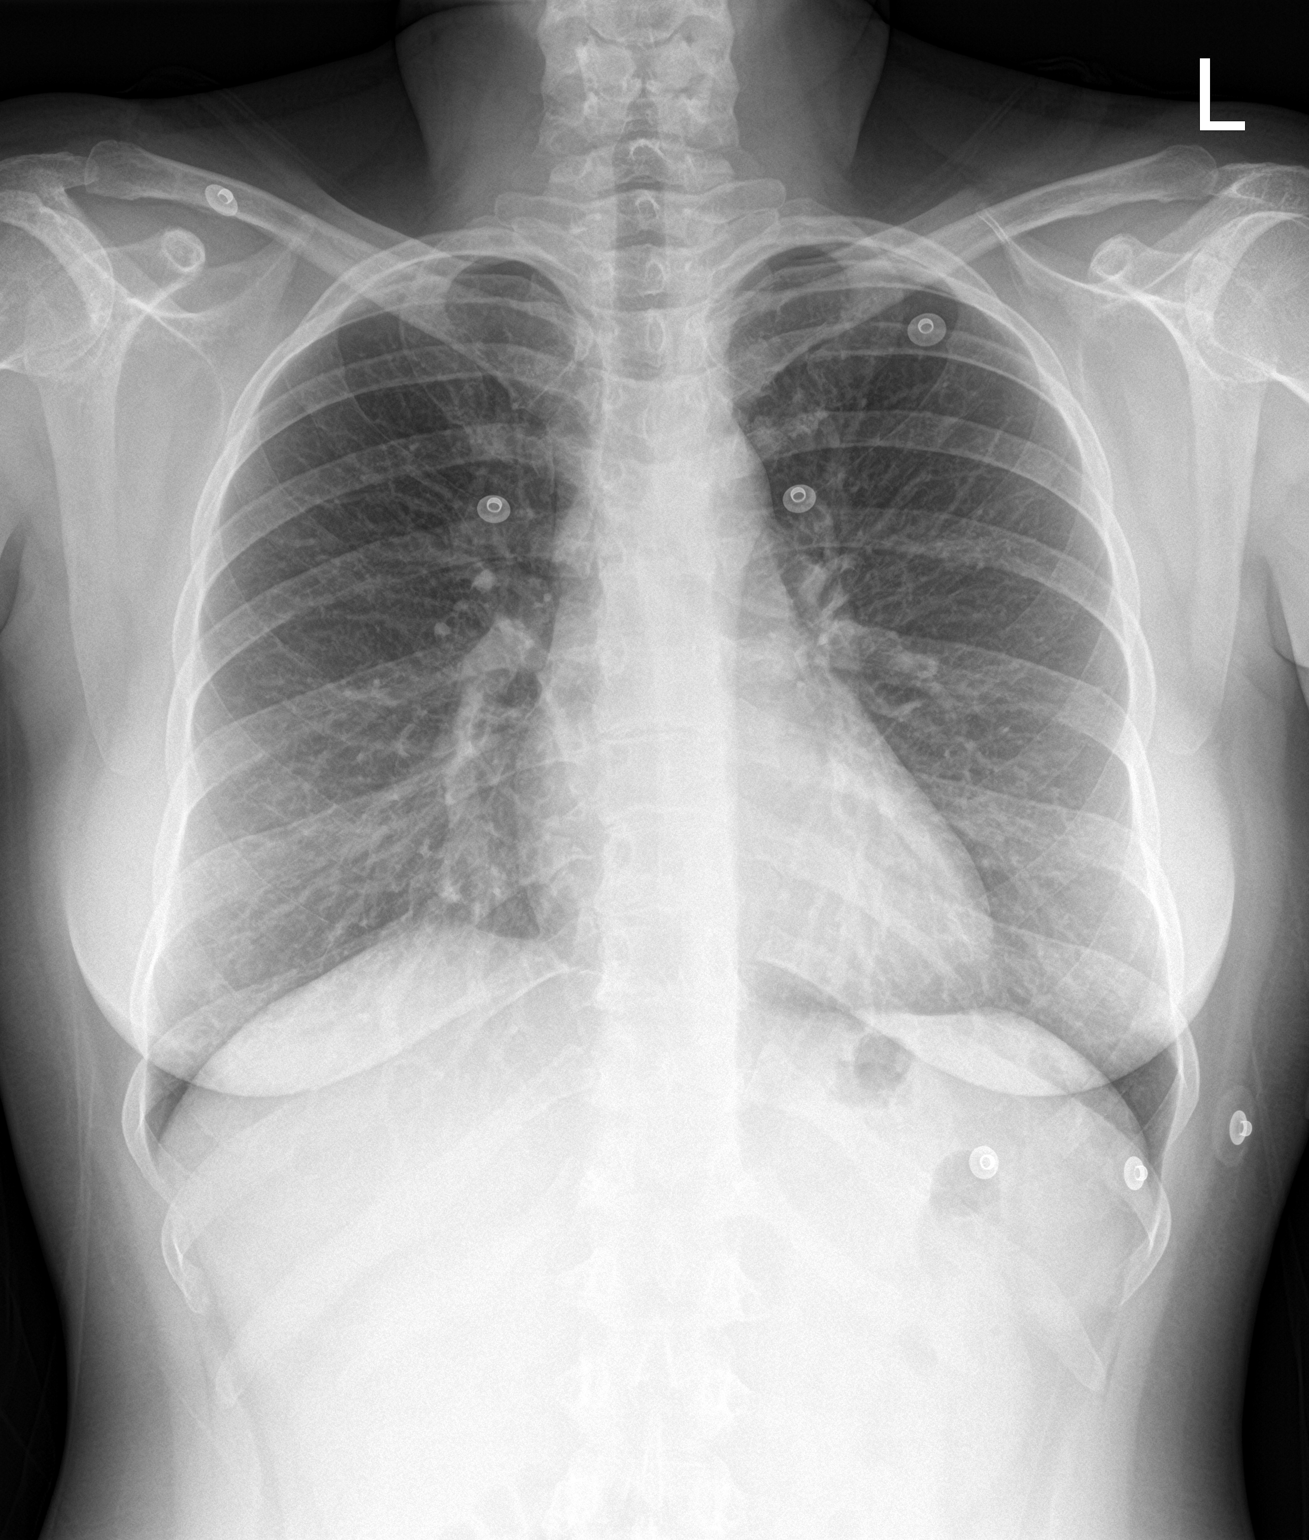

[chest lat]
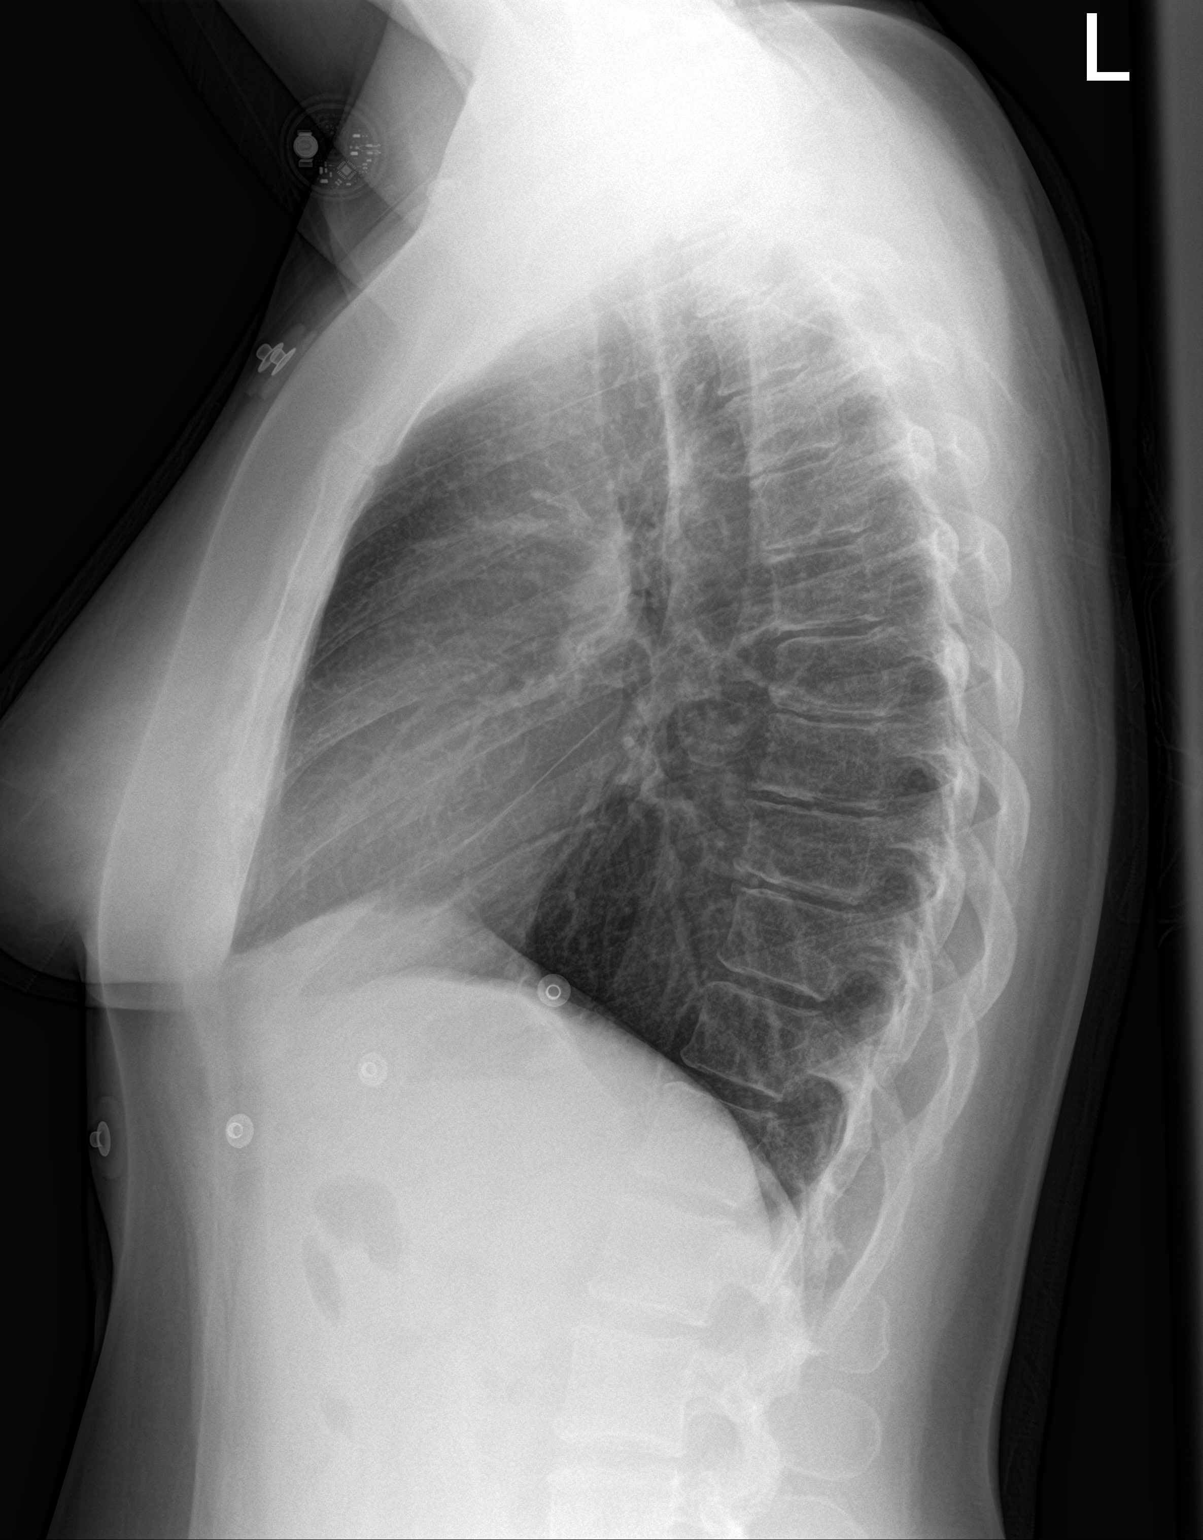

[2 of 2 positions shown; findings below may reference images not displayed]

FINDINGS: The heart size and mediastinal contours are within normal limits.
Both lungs are clear. The visualized skeletal structures are
unremarkable.
IMPRESSION: No active cardiopulmonary disease.

## 2021-11-27 ENCOUNTER — Other Ambulatory Visit: Payer: Self-pay

## 2021-11-27 DIAGNOSIS — E109 Type 1 diabetes mellitus without complications: Secondary | ICD-10-CM

## 2021-11-27 MED ORDER — DEXCOM G6 SENSOR MISC
3 refills | Status: DC
Start: 2021-11-27 — End: 2022-05-23

## 2021-11-28 ENCOUNTER — Other Ambulatory Visit: Payer: Self-pay | Admitting: Cardiovascular Disease

## 2021-11-30 ENCOUNTER — Telehealth: Payer: Self-pay | Admitting: Cardiovascular Disease

## 2021-11-30 NOTE — Telephone Encounter (Signed)
Patient called and wanted to talk with Dr. Allyson Sabal or nurse in regards to her medication  metoprolol succinate (TOPROL-XL) 25 MG 24 hr tablet ? ? ?

## 2021-11-30 NOTE — Telephone Encounter (Signed)
Attempted to contact patient to discuss medications.  ?Patient voicemail box was full, unable to leave a message.  ? ?

## 2021-12-06 ENCOUNTER — Ambulatory Visit: Payer: BC Managed Care – PPO | Admitting: Endocrinology

## 2021-12-10 NOTE — Telephone Encounter (Signed)
Spoke with pt, she reports her insurance is not wanting to pay for the metoprolol succ twice daily for her and wants her to change to 50 mg once daily and she wants to discuss with dr berry. Aware she is overdue for follow up appointment, follow up scheduled for tomorrow at 9:15 am. ?

## 2021-12-11 ENCOUNTER — Ambulatory Visit: Payer: BC Managed Care – PPO | Admitting: Cardiovascular Disease

## 2021-12-11 DIAGNOSIS — E782 Mixed hyperlipidemia: Secondary | ICD-10-CM | POA: Diagnosis not present

## 2021-12-11 DIAGNOSIS — I2102 ST elevation (STEMI) myocardial infarction involving left anterior descending coronary artery: Secondary | ICD-10-CM

## 2021-12-11 DIAGNOSIS — R5383 Other fatigue: Secondary | ICD-10-CM | POA: Insufficient documentation

## 2021-12-11 DIAGNOSIS — G4733 Obstructive sleep apnea (adult) (pediatric): Secondary | ICD-10-CM | POA: Insufficient documentation

## 2021-12-11 DIAGNOSIS — R002 Palpitations: Secondary | ICD-10-CM | POA: Diagnosis not present

## 2021-12-11 DIAGNOSIS — Z720 Tobacco use: Secondary | ICD-10-CM | POA: Diagnosis not present

## 2021-12-11 MED ORDER — METOPROLOL SUCCINATE ER 50 MG PO TB24: 50.0000 mg | ORAL_TABLET | Freq: Every day | ORAL | 3 refills | Status: DC

## 2021-12-11 NOTE — Assessment & Plan Note (Signed)
History of palpitations on metoprolol which are probably PVCs.  She does drink 2 espresso shots a day as well as a Diet Coke. ?

## 2021-12-11 NOTE — Assessment & Plan Note (Signed)
History of CAD status post non-STEMI 05/05/2020 with cardiac catheterization performed the same day by Dr. Velva Harman revealing a 95% mid LAD lesion that was stented with a 2.25 mm x 18 mm long resolute Onyx drug-eluting stent.  She had no other significant CAD.  Her LV function was normal.  She is currently asymptomatic on low-dose aspirin and Brilinta.  It has been over a year since her intervention.  We will discontinue Brilinta and treat her with aspirin alone. ?

## 2021-12-11 NOTE — Assessment & Plan Note (Signed)
Discontinued

## 2021-12-11 NOTE — Progress Notes (Signed)
? ? ? ?12/11/2021 ?Alicia Phillips   ?1981-02-05  ?[id number] ? ?Primary Physician Aliene Beams, MD ?Primary Cardiologist: Runell Gess MD [facility] [place], [place] ? ?HPI:  Alicia Phillips is a 41 y.o.  mildly overweight Caucasian female with a history of type 1 diabetes since 1986 tobacco abuse who presented on 05/05/2020 with an anterior non-STEMI.  I last saw her in the office 08/11/2020.  She underwent diagnostic coronary angiography the same day by Dr. Kirke Corin found a 95% mid LAD lesion that was stented with a 2.25 mm x 18 mm long resolute Onyx drug-eluting stent.  She had no other significant CAD.  LV function was normal.  Since that time she has stopped smoking.  She did have elevated lipids during that admit admission but was discharged home on high-dose statin therapy in addition to dual antiplatelet therapy including low-dose aspirin and Brilinta twice daily.  She was seen in the ER on 05/02/2020 with palpitations which she thought was PVCs and were similar to her presenting symptoms although there was no objective evidence of this and she was sent home on double the beta-blocker dose.  She has felt well since. ?  ?Since I saw her in the office 3 months ago she continues to do well.  She has an occasional PVC but this is rare.  Her lipid profile came back in the appropriate range on 08/09/2020 with an LDL of 53 on high-dose statin therapy.  She remains on dual antiplatelet therapy.  She is otherwise asymptomatic. ? ? ?Current Meds  ?Medication Sig  ? ACETAMINOPHEN EXTRA STRENGTH 500 MG tablet Take 500 mg by mouth every 4 (four) hours as needed for mild pain or headache.   ? acetone, urine, test strip 1 strip by Does not apply route as needed for high blood sugar.  ? aspirin 81 MG chewable tablet Chew 1 tablet (81 mg total) by mouth daily.  ? atorvastatin (LIPITOR) 80 MG tablet Take 1 tablet (80 mg total) by mouth daily. Please schedule appt for future refills. 1st attempt  ? Continuous Blood Gluc Sensor  (DEXCOM G6 SENSOR) MISC USE AS DIRECT6ED  ? Continuous Blood Gluc Transmit (DEXCOM G6 TRANSMITTER) MISC USE AS DIRECTED  ? FLUoxetine (PROZAC) 20 MG capsule Take 60 mg by mouth daily.  ? INPEN 100-PINK-NOVO DEVI 3 (three) times daily.  ? insulin aspart (NOVOLOG) cartridge As directed, for a total of 20 units/day  ? lamoTRIgine (LAMICTAL) 100 MG tablet Take 1 tablet (100 mg total) by mouth at bedtime. Take one tablet at bedtime.  ? nitroGLYCERIN (NITROSTAT) 0.4 MG SL tablet DISSOLVE ONE TABLET UNDER TONGUE AS NEEDED FOR CHEST PAIN EVERY 5 MINUTES  ? NOVOFINE PEN NEEDLE 32G X 6 MM MISC SMARTSIG:Injection 5 Times Daily  ? TRESIBA FLEXTOUCH 100 UNIT/ML FlexTouch Pen ADMINISTER 28 UNITS UNDER THE SKIN DAILY  ? [DISCONTINUED] fluconazole (DIFLUCAN) 150 MG tablet Take 1 tablet (150 mg total) by mouth daily.  ? [DISCONTINUED] metoprolol succinate (TOPROL-XL) 25 MG 24 hr tablet TAKE 1 TABLET(25 MG) BY MOUTH IN THE MORNING AND AT BEDTIME  ? [DISCONTINUED] METOPROLOL TARTRATE PO Take 1 tablet by mouth in the morning and at bedtime.  ? [DISCONTINUED] ticagrelor (BRILINTA) 90 MG TABS tablet Take 1 tablet (90 mg total) by mouth 2 (two) times daily. Appointment needed  ? [DISCONTINUED] tirzepatide [facility]) 2.5 MG/0.5ML Pen Inject 2.5 mg into the skin once a week.  ?  ? ?Allergies  ?Allergen Reactions  ? Sulfamethoxazole-Trimethoprim Rash  ? ? ?  Social History  ? ?Socioeconomic History  ? Marital status: Single  ?  Spouse name: Not on file  ? Number of children: Not on file  ? Years of education: Not on file  ? Highest education level: Not on file  ?Occupational History  ? Not on file  ?Tobacco Use  ? Smoking status: Former  ?  Types: Cigarettes  ? Smokeless tobacco: Never  ? Tobacco comments:  ?  patient stated she was smoking but has quit since 05/30/20  ?Vaping Use  ? Vaping Use: Never used  ?Substance and Sexual Activity  ? Alcohol use: Never  ? Drug use: Never  ? Sexual activity: Not on file  ?Other Topics Concern  ? Not on  file  ?Social History Narrative  ? Not on file  ? ?Social Determinants of Health  ? ?Financial Resource Strain: Not on file  ?Food Insecurity: Not on file  ?Transportation Needs: Not on file  ?Physical Activity: Not on file  ?Stress: Not on file  ?Social Connections: Not on file  ?Intimate Partner Violence: Not on file  ?  ? ?Review of Systems: ?General: negative for chills, fever, night sweats or weight changes.  ?Cardiovascular: negative for chest pain, dyspnea on exertion, edema, orthopnea, palpitations, paroxysmal nocturnal dyspnea or shortness of breath ?Dermatological: negative for rash ?Respiratory: negative for cough or wheezing ?Urologic: negative for hematuria ?Abdominal: negative for nausea, vomiting, diarrhea, bright red blood per rectum, melena, or hematemesis ?Neurologic: negative for visual changes, syncope, or dizziness ?All other systems reviewed and are otherwise negative except as noted above. ? ? ? ?Blood pressure 120/74, pulse 79, height 5\' 8"  (1.727 m), weight 198 lb (89.8 kg).  ?General appearance: alert and no distress ?Neck: no adenopathy, no carotid bruit, no JVD, supple, symmetrical, trachea midline, and thyroid not enlarged, symmetric, no tenderness/mass/nodules ?Lungs: clear to auscultation bilaterally ?Heart: regular rate and rhythm, S1, S2 normal, no murmur, click, rub or gallop ?Extremities: extremities normal, atraumatic, no cyanosis or edema ?Pulses: 2+ and symmetric ?Skin: Skin color, texture, turgor normal. No rashes or lesions ?Neurologic: Grossly normal ? ?EKG normal sinus rhythm at 79 without ST or T wave changes.  I personally reviewed this EKG. ? ? ?ASSESSMENT AND PLAN:  ? ?STEMI (ST elevation myocardial infarction) (HCC) ?History of CAD status post non-STEMI 05/05/2020 with cardiac catheterization performed the same day by Dr. 07/05/2020 revealing a 95% mid LAD lesion that was stented with a 2.25 mm x 18 mm long resolute Onyx drug-eluting stent.  She had no other significant CAD.   Her LV function was normal.  She is currently asymptomatic on low-dose aspirin and Brilinta.  It has been over a year since her intervention.  We will discontinue Brilinta and treat her with aspirin alone. ? ?Tobacco abuse ?Discontinued ? ?Hyperlipidemia ?History of hyperlipidemia on high-dose statin therapy with lipid profile performed 08/09/2020 revealing total cholesterol 123, LDL 53 and HDL 53.  We will recheck a lipid liver profile. ? ?Palpitations ?History of palpitations on metoprolol which are probably PVCs.  She does drink 2 espresso shots a day as well as a Diet Coke. ? ?Obstructive sleep apnea ?Moderate obesity, nocturnal snoring and daytime somnolence with an Epworth score of 7.  She will require outpatient sleep study to rule out obstructive sleep apnea. ? ? ? ? ?10/07/2020 MD FACP,FACC,FAHA, FSCAI ?12/11/2021 ?10:06 AM ?

## 2021-12-11 NOTE — Patient Instructions (Signed)
Medication Instructions:  ? ?-Stop Brilinta. ? ?-Change metoprolol succinate (Toprol-xl) to 50mg  once daily. ? ?*If you need a refill on your cardiac medications before your next appointment, please call your pharmacy* ? ? ?Lab Work: ?Your physician recommends that you return for lab work in: the next week or 2 for FASTING lipid/liver profile. ? ?If you have labs (blood work) drawn today and your tests are completely normal, you will receive your results only by: ?MyChart Message (if you have MyChart) OR ?A paper copy in the mail ?If you have any lab test that is abnormal or we need to change your treatment, we will call you to review the results. ? ? ?Testing/Procedures: ?Your physician has recommended that you have a sleep study. This test records several body functions during sleep, including: brain activity, eye movement, oxygen and carbon dioxide blood levels, heart rate and rhythm, breathing rate and rhythm, the flow of air through your mouth and nose, snoring, body muscle movements, and chest and belly movement. We will call you to set this up. ? ? ?Follow-Up: ?At [facility], you and your health needs are our priority.  As part of our continuing mission to provide you with exceptional heart care, we have created designated Provider Care Teams.  These Care Teams include your primary Cardiologist (physician) and Advanced Practice Providers (APPs -  Physician Assistants and Nurse Practitioners) who all work together to provide you with the care you need, when you need it. ? ?We recommend signing up for the patient portal called "MyChart".  Sign up information is provided on this After Visit Summary.  MyChart is used to connect with patients for Virtual Visits (Telemedicine).  Patients are able to view lab/test results, encounter notes, upcoming appointments, etc.  Non-urgent messages can be sent to your provider as well.   ?To learn more about what you can do with MyChart, go to [email or web address].    ? ?Your next appointment:   ?12 month(s) ? ?The format for your next appointment:   ?In Person ? ?Provider:   ?Quay Burow, MD  ?

## 2021-12-11 NOTE — Assessment & Plan Note (Signed)
Moderate obesity, nocturnal snoring and daytime somnolence with an Epworth score of 7.  She will require outpatient sleep study to rule out obstructive sleep apnea. ?

## 2021-12-11 NOTE — Assessment & Plan Note (Signed)
History of hyperlipidemia on high-dose statin therapy with lipid profile performed 08/09/2020 revealing total cholesterol 123, LDL 53 and HDL 53.  We will recheck a lipid liver profile. ?

## 2021-12-27 ENCOUNTER — Other Ambulatory Visit: Payer: Self-pay | Admitting: Cardiovascular Disease

## 2022-01-15 ENCOUNTER — Emergency Department (HOSPITAL_COMMUNITY)
Admission: EM | Admit: 2022-01-15 | Discharge: 2022-01-15 | Disposition: A | Discharge status: Home / Self Care | Payer: BC Managed Care – PPO | Attending: Emergency Medicine | Admitting: Emergency Medicine

## 2022-01-15 ENCOUNTER — Encounter (HOSPITAL_COMMUNITY): Payer: Self-pay | Admitting: Emergency Medicine

## 2022-01-15 ENCOUNTER — Other Ambulatory Visit: Payer: Self-pay

## 2022-01-15 ENCOUNTER — Ambulatory Visit (HOSPITAL_COMMUNITY)
Admission: EM | Admit: 2022-01-15 | Discharge: 2022-01-15 | Disposition: A | Discharge status: Home / Self Care | Payer: No Typology Code available for payment source | Source: Ambulatory Visit | Attending: Emergency Medicine | Admitting: Emergency Medicine

## 2022-01-15 ENCOUNTER — Other Ambulatory Visit (HOSPITAL_COMMUNITY): Payer: Self-pay

## 2022-01-15 DIAGNOSIS — T7421XA Adult sexual abuse, confirmed, initial encounter: Secondary | ICD-10-CM | POA: Insufficient documentation

## 2022-01-15 DIAGNOSIS — Z0441 Encounter for examination and observation following alleged adult rape: Secondary | ICD-10-CM | POA: Insufficient documentation

## 2022-01-15 LAB — RAPID HIV SCREEN (HIV 1/2 AB+AG)
HIV 1/2 Antibodies: NONREACTIVE
HIV-1 P24 Antigen - HIV24: NONREACTIVE

## 2022-01-15 LAB — COMPREHENSIVE METABOLIC PANEL
ALT: 33 U/L (ref 0–44)
AST: 32 U/L (ref 15–41)
Albumin: 3.7 g/dL (ref 3.5–5.0)
Alkaline Phosphatase: 112 U/L (ref 38–126)
Anion gap: 11 (ref 5–15)
BUN: 12 mg/dL (ref 6–20)
CO2: 18 mmol/L — ABNORMAL LOW (ref 22–32)
Calcium: 8.9 mg/dL (ref 8.9–10.3)
Chloride: 105 mmol/L (ref 98–111)
Creatinine, Ser: 0.64 mg/dL (ref 0.44–1.00)
GFR, Estimated: >60 mL/min (ref >60)
Glucose, Bld: 247 mg/dL — ABNORMAL HIGH (ref 70–99)
Potassium: 4.6 mmol/L (ref 3.5–5.1)
Sodium: 134 mmol/L — ABNORMAL LOW (ref 135–145)
Total Bilirubin: 0.5 mg/dL (ref 0.3–1.2)
Total Protein: 7.6 g/dL (ref 6.5–8.1)

## 2022-01-15 LAB — POC URINE PREG, ED: Preg Test, Ur: NEGATIVE

## 2022-01-15 LAB — RPR: RPR Ser Ql: NONREACTIVE

## 2022-01-15 LAB — HEPATITIS B SURFACE ANTIGEN: Hepatitis B Surface Ag: NONREACTIVE

## 2022-01-15 LAB — HEPATITIS C ANTIBODY: HCV Ab: NONREACTIVE

## 2022-01-15 MED ORDER — METRONIDAZOLE 500 MG PO TABS
2000.0000 mg | ORAL_TABLET | Freq: Once | ORAL | Status: AC
  Administered 2022-01-15: 2000 mg via ORAL
  Filled 2022-01-15: qty 4

## 2022-01-15 MED ORDER — AZITHROMYCIN 250 MG PO TABS
1000.0000 mg | ORAL_TABLET | Freq: Once | ORAL | Status: AC
  Administered 2022-01-15: 1000 mg via ORAL
  Filled 2022-01-15: qty 2

## 2022-01-15 MED ORDER — ELVITEG-COBIC-EMTRICIT-TENOFAF 150-150-200-10 MG PO TABS
ORAL_TABLET | ORAL | 0 refills | Status: DC
  Filled 2022-01-15: qty 30, 30d supply, fill #0

## 2022-01-15 MED ORDER — ELVITEG-COBIC-EMTRICIT-TENOFAF 150-150-200-10 MG PREPACK
1.0000 | ORAL_TABLET | Freq: Once | ORAL | Status: AC
  Administered 2022-01-15: 1 via ORAL
  Filled 2022-01-15 (×2): qty 1

## 2022-01-15 MED ORDER — ULIPRISTAL ACETATE 30 MG PO TABS
30.0000 mg | ORAL_TABLET | Freq: Once | ORAL | Status: AC
  Administered 2022-01-15: 30 mg via ORAL
  Filled 2022-01-15: qty 1

## 2022-01-15 MED ORDER — PROMETHAZINE HCL 25 MG PO TABS
25.0000 mg | ORAL_TABLET | Freq: Four times a day (QID) | ORAL | Status: DC | PRN
Start: 2022-01-15 — End: 2022-01-15
  Administered 2022-01-15: 25 mg via ORAL

## 2022-01-15 MED ORDER — CEFTRIAXONE SODIUM 500 MG IJ SOLR
500.0000 mg | Freq: Once | INTRAMUSCULAR | Status: AC
  Administered 2022-01-15: 500 mg via INTRAMUSCULAR

## 2022-01-15 MED ORDER — ELVITEG-COBIC-EMTRICIT-TENOFAF 150-150-200-10 MG PO TABS: 1.0000 | ORAL_TABLET | Freq: Every day | ORAL | 0 refills | Status: DC

## 2022-01-15 MED ORDER — LIDOCAINE HCL (PF) 1 % IJ SOLN
1.0000 mL | Freq: Once | INTRAMUSCULAR | Status: AC
  Administered 2022-01-15: 2 mL

## 2022-01-15 NOTE — Discharge Instructions (Addendum)
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 the medication to a medication take-back program. Check with your pharmacy or law enforcement to find a location. If you cannot return the medication, ask your care team how to get rid of this medication safely. NOTE: This sheet is a summary. It may not cover all possible information. If you have questions about  this medicine, talk to your doctor, pharmacist, or health care provider.  2022 Elsevier/Gold Standard (2020-08-22 09:56:16)    Ulipristal Tablets  What is this medication? ULIPRISTAL (UE li pris tal) can prevent pregnancy. It should be taken as soon as possible in the 5 days (120 hours) after unprotected sex or if you think your contraceptive didn't work. It belongs to a group of medications called emergency contraceptives. It does not prevent HIV or other sexually transmitted  infections (STIs). This medicine may be used for other purposes; ask your health care provider or pharmacist if you have questions. COMMON BRAND NAME(S): ella What should I tell my care team before I take this medication? They need to know if you have any of these conditions: Liver disease An unusual or allergic reaction to ulipristal, other medications, foods, dyes, or preservatives Pregnant or trying to get pregnant Breast-feeding How should I use this medication? Take this medication by mouth with or without food. Your care team may want you to use a quick-response pregnancy test prior to using the tablets. Take your medication as soon as possible and not more than 5 days (120 hours) after the event. This medication can be taken at any time during your menstrual cycle. Follow the dose instructions of your care team exactly. Contact your care team right away if you vomit within 3 hours of taking your medication to discuss if you need to take another tablet. A patient package insert for the product will be given with each prescription and refill. Read this sheet carefully each time. The sheet may change frequently. Contact your care team about the use of this medication in children. Special care may be needed. Overdosage: If you think you have taken too much of this medicine contact a poison control center or emergency room at once. NOTE: This medicine is only for you. Do not share this medicine with others. What if I miss a dose? This medication is not for regular use. If you vomit within 3 hours of taking your dose, contact your care team for instructions. What may interact with this medication? This medication may interact with the following: Barbiturates such as phenobarbital or primidone Birth control pills Bosentan Carbamazepine Certain medications for fungal infections like griseofulvin, itraconazole, and ketoconazole Certain medications for HIV or AIDS or  hepatitis Dabigatran Digoxin Felbamate Fexofenadine Oxcarbazepine Phenytoin Rifampin St. John's Wort Topiramate This list may not describe all possible interactions. Give your health care provider a list of all the medicines, herbs, non-prescription drugs, or dietary supplements you use. Also tell them if you smoke, drink alcohol, or use illegal drugs. Some items may interact with your medicine. What should I watch for while using this medication? Your period may begin a few days earlier or later than expected. If your period is more than 7 days late, pregnancy is possible. See your care team as soon as you can and get a pregnancy test. Talk to your care team before taking this medication if you know or suspect that you are pregnant. Contact your care team if you think you may be pregnant and you have taken this medication. If you have severe abdominal pain about 3 to 5 weeks after taking this medication, you may have a pregnancy outside the womb, which is called an ectopic or tubal pregnancy. Call your care team or go to the nearest emergency room right away if you think this is happening. Discuss birth control options with your care team. Emergency birth control is not to  be used routinely to prevent pregnancy. It should not be used more than once in the same cycle. Birth control pills may not work properly while you are taking this medication. Wait at least 5 days after taking this medication to start or continue other hormone based birth control. Be sure to use a reliable barrier contraceptive method (such as a condom with spermicide) between the time you take this medication and your next period. This medication does not protect you against HIV infection (AIDS) or any other sexually transmitted diseases (STDs). What side effects may I notice from receiving this medication? Side effects that you should report to your care team as soon as possible: Allergic reactions-skin rash, itching, hives,  swelling of the face, lips, tongue, or throat Side effects that usually do not require medical attention (report to your care team if they continue or are bothersome): Dizziness Fatigue Headache Irregular menstrual cycles or spotting Menstrual cramps Nausea Stomach pain This list may not describe all possible side effects. Call your doctor for medical advice about side effects. You may report side effects to FDA at 1-800-FDA-1088.    Elvitegravir; Cobicistat; Emtricitabine; Tenofovir Alafenamide oral tablets  What is this medication? ELVITEGRAVIR; COBICISTAT; EMTRICITABINE; TENOFOVIR ALAFENAMIDE (el vye TEG ra veer; koe BIS i stat; em tri SIT uh bean; te NOE fo veer) is 3 antiretroviral medicines and a medication booster in 1 tablet. It is used to treat HIV. This medicine is not a cure for HIV. This medicine can lower, but not fully prevent, the risk of spreading HIV to others. This medicine may be used for other purposes; ask your health care provider or pharmacist if you have questions. COMMON BRAND NAME(S): Genvoya What should I tell my care team before I take this medication? They need to know if you have any of these conditions: kidney disease liver disease an unusual or allergic reaction to elvitegravir, cobicistat, emtricitabine, tenofovir, other medicines, foods, dyes, or preservatives pregnant or trying to get pregnant breast-feeding How should I use this medication? Take this medicine by mouth with a glass of water. Follow the directions on the prescription label. Take this medicine with food. Take your medicine at regular intervals. Do not take your medicine more often than directed. For your anti-HIV therapy to work as well as possible, take each dose exactly as prescribed. Do not skip doses or stop your medicine even if you feel better. Skipping doses may make the HIV virus resistant to this medicine and other medicines. Do not stop taking except on your doctor's advice. Talk  to your pediatrician regarding the use of this medicine in children. While this drug may be prescribed for selected conditions, precautions do apply. Overdosage: If you think you have taken too much of this medicine contact a poison control center or emergency room at once. NOTE: This medicine is only for you. Do not share this medicine with others. What if I miss a dose? If you miss a dose, take it as soon as you can. If it is almost time for your next dose, take only that dose. Do not take double or extra doses. What may interact with this medication? Do not take this medicine with any of the following medications: adefovir alfuzosin certain medicines for seizures like carbamazepine, phenobarbital, phenytoin cisapride irinotecan lumacaftor; ivacaftor lurasidone medicines for cholesterol like lovastatin, simvastatin medicines for headaches like dihydroergotamine, ergotamine, methylergonovine midazolam naloxegol other antiviral medicines for HIV or AIDS pimozide rifampin sildenafil St. John's wort triazolam This medicine may also  interact with the following medications: antacids atorvastatin bosentan buprenorphine; naloxone certain antibiotics like clarithromycin, telithromycin, rifabutin, rifapentine certain medications for anxiety or sleep like buspirone, clorazepate, diazepam, estazolam, flurazepam, zolpidem certain medicines for blood pressure or heart disease like amlodipine, diltiazem, felodipine, metoprolol, nicardipine, nifedipine, timolol, verapamil certain medicines for depression, anxiety, or psychiatric disturbances certain medicines for erectile dysfunction like avanafil, sildenafil, tadalafil, vardenafil certain medicines for fungal infection like itraconazole, ketoconazole, voriconazole certain medicines that treat or prevent blood clots like warfarin, apixaban, betrixaban, dabigatran, edoxaban, and rivaroxaban colchicine cyclosporine female hormones, like  estrogens and progestins and birth control pills medicines for infection like acyclovir, cidofovir, valacyclovir, ganciclovir, valganciclovir medicines for irregular heart beat like amiodarone, bepridil, digoxin, disopyramide, dofetilide, flecainide, lidocaine, mexiletine, propafenone, quinidine metformin oxcarbazepine phenothiazines like perphenazine, risperidone, thioridazine salmeterol sirolimus steroid medicines like betamethasone, budesonide, ciclesonide, dexamethasone, fluticasone, methylprednisolone, mometasone, triamcinolone tacrolimus This list may not describe all possible interactions. Give your health care provider a list of all the medicines, herbs, non-prescription drugs, or dietary supplements you use. Also tell them if you smoke, drink alcohol, or use illegal drugs. Some items may interact with your medicine. What should I watch for while using this medication? Visit your doctor or health care professional for regular check ups. Discuss any new symptoms with your doctor. You will need to have important blood work done while on this medicine. HIV is spread to others through sexual or blood contact. Talk to your doctor about how to stop the spread of HIV. If you have hepatitis B, talk to your doctor if you plan to stop this medicine. The symptoms of hepatitis B may get worse if you stop this medicine. Birth control pills may not work properly while you are taking this medicine. Talk to your doctor about using an extra method of birth control. Women who can still have children must use a reliable form of barrier contraception, like a condom. What side effects may I notice from receiving this medication? Side effects that you should report to your doctor or health care professional as soon as possible: allergic reactions like skin rash, itching or hives, swelling of the face, lips, or tongue breathing problems fast, irregular heartbeat muscle pain or weakness signs and symptoms of  kidney injury like trouble passing urine or change in the amount of urine signs and symptoms of liver injury like dark yellow or brown urine; general ill feeling or flu-like symptoms; light-colored stools; loss of appetite; right upper belly pain; unusually weak or tired; yellowing of the eyes or skin Side effects that usually do not require medical attention (report to your doctor or health care professional if they continue or are bothersome): diarrhea headache nausea tiredness This list may not describe all possible side effects. Call your doctor for medical advice about side effects. You may report side effects to FDA at 1-800-FDA-1088. Where should I keep my medication? Keep out of the reach of children. Store at room temperature below 30 degrees C (86 degrees F). Throw away any unused medicine after the expiration date. NOTE: This sheet is a summary. It may not cover all possible information. If you have questions about this medicine, talk to your doctor, pharmacist, or health care provider.  2022 Elsevier/Gold Standard (2019-06-15 17:54:51)     Promethazine (pack of 3 for home use) Also known as:  Phenergan  Promethazine Tablets  What is this medication? PROMETHAZINE (proe METH a zeen) prevents and treats the symptoms of an allergic reaction. It works by blocking histamine, a  substance released by the body during an allergic reaction. It may also help you relax, go to sleep, and relieve nausea, vomiting, or pain before or after procedures. It can also prevent and treat motion sickness. It works by helping your nervous system calm down by blocking substances in the body that may cause nausea and vomiting. It belongs to a group of medications called antihistamines. This medicine may be used for other purposes; ask your health care provider or pharmacist if you have questions. COMMON BRAND NAME(S): Phenergan What should I tell my care team before I take this medication? They need to  know if you have any of these conditions: Blockage in your bowel Diabetes Glaucoma Have trouble controlling your muscles Heart disease Liver disease Low blood counts, like low white cell, platelet, or red cell counts Lung or breathing disease, like asthma Parkinson's disease Prostate disease Seizures Stomach or intestine problems Trouble passing urine An unusual or allergic reaction to promethazine, sulfites, other medications, foods, dyes, or preservatives Pregnant or trying to get pregnant Breast-feeding How should I use this medication? Take this medication by mouth with a glass of water. Follow the directions on the prescription label. Take your doses at regular intervals. Do not take your medication more often than directed. Talk to your care team about the use of this medication in children. Special care may be needed. This medication should not be given to infants and children younger than 59 years old. Overdosage: If you think you have taken too much of this medicine contact a poison control center or emergency room at once. NOTE: This medicine is only for you. Do not share this medicine with others. What if I miss a dose? If you miss a dose, take it as soon as you can. If it is almost time for your next dose, take only that dose. Do not take double or extra doses. What may interact with this medication? Alcohol Antihistamines for allergy, cough, and cold Atropine Certain medications for anxiety or sleep Certain medications for bladder problems like oxybutynin, tolterodine Certain medications for depression like amitriptyline, fluoxetine, sertraline Certain medications for Parkinson's disease like benztropine, trihexyphenidyl Certain medications for stomach problems like dicyclomine, hyoscyamine Certain medications for travel sickness like scopolamine Epinephrine General anesthetics like halothane, isoflurane, methoxyflurane, propofol Ipratropium MAOIs like Marplan,  Nardil, and Parnate Medications for high blood pressure Medications for seizures like phenobarbital, primidone, phenytoin Medications that relax muscles for surgery Metoclopramide Narcotic medications for pain This list may not describe all possible interactions. Give your health care provider a list of all the medicines, herbs, non-prescription drugs, or dietary supplements you use. Also tell them if you smoke, drink alcohol, or use illegal drugs. Some items may interact with your medicine. What should I watch for while using this medication? Visit your care team for regular checks on your progress. Tell your care team if symptoms do not start to get better or if they get worse. You may get drowsy or dizzy. Do not drive, use machinery, or do anything that needs mental alertness until you know how this medication affects you. To reduce the risk of dizzy or fainting spells, do not stand or sit up quickly, especially if you are an older patient. Alcohol may increase dizziness and drowsiness. Avoid alcoholic drinks. Your mouth may get dry. Chewing sugarless gum or sucking hard candy, and drinking plenty of water may help. Contact your care team if the problem does not go away or is severe. This medication may cause dry  eyes and blurred vision. If you wear contact lenses you may feel some discomfort. Lubricating drops may help. See your care team if the problem does not go away or is severe. This medication can make you more sensitive to the sun. Keep out of the sun. If you cannot avoid being in the sun, wear protective clothing and use sunscreen. Do not use sun lamps or tanning beds/booths. This medication may increase blood sugar. Ask your care team if changes in diet or medications are needed if you have diabetes. What side effects may I notice from receiving this medication? Side effects that you should report to your care team as soon as possible: Allergic reactions-skin rash, itching, hives,  swelling of the face, lips, tongue, or throat CNS depression-slow or shallow breathing, shortness of breath, feeling faint, dizziness, confusion, trouble staying awake High fever stiff muscles, increased sweating, fast or irregular heartbeat, and confusion, which may be signs of neuroleptic malignant syndrome Infection-fever, chills, cough, or sore throat Liver injury-right upper belly pain, loss of appetite, nausea, light-colored stool, dark yellow or brown urine, yellowing skin or eyes, unusual weakness or fatigue Seizures Sudden eye pain or change in vision such as blurry vision, seeing halos around lights, vision loss Trouble passing urine Uncontrolled and repetitive body movements, muscle stiffness or spasms, tremors or shaking, loss of balance or coordination, restlessness, shuffling walk, which may be signs of extrapyramidal symptoms (EPS) Side effects that usually do not require medical attention (report to your care team if they continue or are bothersome): Confusion Constipation Dizziness Drowsiness Dry mouth Sensitivity to light Vivid dreams or nightmares This list may not describe all possible side effects. Call your doctor for medical advice about side effects. You may report side effects to FDA at 1-800-FDA-1088. Where should I keep my medication? Keep out of the reach of children. Store at room temperature, between 20 and 25 degrees C (68 and 77 degrees F). Protect from light. Throw away any unused medication after the expiration date. NOTE: This sheet is a summary. It may not cover all possible information. If you have questions about this medicine, talk to your doctor, pharmacist, or health care provider.  2022 Elsevier/Gold Standard (2020-10-24 10:57:27)
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
## 2022-01-19 ENCOUNTER — Other Ambulatory Visit (HOSPITAL_COMMUNITY): Payer: Self-pay

## 2022-01-20 NOTE — SANE Note (Signed)
01/19/2022 1033: Message left by patient this morning explaining that the courier had not yet delivered her Genvoya due to some confusion about her mailbox. States that she is down to her last pill. I also listened to message that had been left yesterday. Message had been left around 1735.   01/19/2022 1250: called patient back x2. No reply and voicemail is full.  01/19/2022 1303: called patient back x2 from ED. No reply.  01/19/2022 1339: Patient returned call. And repeated her above concerns. She added that she called the courier service and left a message but did not hear back. I provided the phone number to [facility] pharmacy to see if courier returned medication to pharmacy.   01/19/2022 1419: Patient called back and reported that the pharmacy tech she spoke told her that medication had not been returned to the pharmacy. Patient asked would the medication still be effective is she missed a dose, and the pharmacy didn't believe it would hurt to miss a dose. We discussed the risk factors for HIV and medication administration. Patient stated she would reach out again on Monday to [facility].  01/19/2022 1430: I sent an email to the [facility] pharmacy team to see if they could assist patient.   01/19/2022 1646: Email received that they would reach out to patient.  01/19/2022 1646: I called patient to inform her that pharmacy was trying to reach her. She reports that she did speak with them and was able to go pick up a replacement script. She thanked me for my assistance. I asked if she had anymore questions. She reported that she was having some vaginal discharge and "I feel some bums down there." I advised her to reach out to her provider for follow up exam and treatment. Patient verbalized understanding.

## 2022-01-22 ENCOUNTER — Ambulatory Visit: Payer: BC Managed Care – PPO | Admitting: Cardiovascular Disease

## 2022-01-22 ENCOUNTER — Other Ambulatory Visit: Payer: Self-pay | Admitting: Cardiovascular Disease

## 2022-02-08 ENCOUNTER — Telehealth: Payer: Self-pay

## 2022-02-08 ENCOUNTER — Other Ambulatory Visit: Payer: Self-pay | Admitting: Endocrinology

## 2022-02-08 MED ORDER — INPEN 100-PINK-NOVOLOG-FIASP DEVI
2 refills | Status: AC
Start: 2022-02-08 — End: ?

## 2022-02-08 NOTE — Telephone Encounter (Signed)
Patient called in states that she needs a new Rx for inpen. Tried to reorder what was on chart and not able too. Are you able to send in Rx to [facility]?

## 2022-02-25 ENCOUNTER — Other Ambulatory Visit: Payer: BC Managed Care – PPO

## 2022-02-25 ENCOUNTER — Other Ambulatory Visit: Payer: Self-pay | Admitting: Endocrinology

## 2022-02-25 DIAGNOSIS — E109 Type 1 diabetes mellitus without complications: Secondary | ICD-10-CM

## 2022-02-28 ENCOUNTER — Ambulatory Visit: Payer: BC Managed Care – PPO | Admitting: Endocrinology

## 2022-05-08 ENCOUNTER — Other Ambulatory Visit: Payer: Self-pay | Admitting: Cardiovascular Disease

## 2022-05-16 ENCOUNTER — Other Ambulatory Visit: Payer: Self-pay

## 2022-05-16 DIAGNOSIS — E109 Type 1 diabetes mellitus without complications: Secondary | ICD-10-CM

## 2022-05-23 ENCOUNTER — Other Ambulatory Visit: Payer: Self-pay

## 2022-05-23 ENCOUNTER — Ambulatory Visit (INDEPENDENT_AMBULATORY_CARE_PROVIDER_SITE_OTHER): Payer: BC Managed Care – PPO | Admitting: Internal Medicine

## 2022-05-23 ENCOUNTER — Encounter: Payer: Self-pay | Admitting: Internal Medicine

## 2022-05-23 VITALS — BP 126/74 | HR 80 | Ht 68.0 in | Wt 199.8 lb

## 2022-05-23 DIAGNOSIS — E1065 Type 1 diabetes mellitus with hyperglycemia: Secondary | ICD-10-CM

## 2022-05-23 DIAGNOSIS — E109 Type 1 diabetes mellitus without complications: Secondary | ICD-10-CM

## 2022-05-23 DIAGNOSIS — E1059 Type 1 diabetes mellitus with other circulatory complications: Secondary | ICD-10-CM

## 2022-05-23 DIAGNOSIS — E10319 Type 1 diabetes mellitus with unspecified diabetic retinopathy without macular edema: Secondary | ICD-10-CM

## 2022-05-23 LAB — POCT GLYCOSYLATED HEMOGLOBIN (HGB A1C): Hemoglobin A1C: 9.7 % — AB (ref 4.0–5.6)

## 2022-05-23 MED ORDER — SEMAGLUTIDE(0.25 OR 0.5MG/DOS) 2 MG/3ML ~~LOC~~ SOPN: 0.5000 mg | PEN_INJECTOR | SUBCUTANEOUS | 11 refills | Status: AC

## 2022-05-23 MED ORDER — DEXCOM G6 SENSOR MISC: 3 refills | Status: DC

## 2022-05-23 MED ORDER — INSULIN ASPART 100 UNIT/ML CARTRIDGE (PENFILL): SUBCUTANEOUS | 5 refills | Status: DC

## 2022-05-23 MED ORDER — DEXCOM G6 TRANSMITTER MISC: 1.0000 | 3 refills | Status: DC

## 2022-05-23 NOTE — Patient Instructions (Addendum)
INcrease Tresiba 30 units daily  No change to Novolog dose at this time  Start Ozempic 0.25 mg once weekly for 6 weeks, if no side effects please increase to 0.5 mg weekly     HOW TO TREAT LOW BLOOD SUGARS (Blood sugar LESS THAN 70 MG/DL) Please follow the RULE OF 15 for the treatment of hypoglycemia treatment (when your (blood sugars are less than 70 mg/dL)   STEP 1: Take 15 grams of carbohydrates when your blood sugar is low, which includes:  3-4 GLUCOSE TABS  OR 3-4 OZ OF JUICE OR REGULAR SODA OR ONE TUBE OF GLUCOSE GEL    STEP 2: RECHECK blood sugar in 15 MINUTES STEP 3: If your blood sugar is still low at the 15 minute recheck --> then, go back to STEP 1 and treat AGAIN with another 15 grams of carbohydrates.

## 2022-05-23 NOTE — Progress Notes (Signed)
Name: Alicia Phillips  Age/ Sex: 41 y.o., female   MRN/ DOB: [id number], June 01, 1981     PCP: Aliene Beams, MD   Reason for Endocrinology Evaluation: Type 1 Diabetes Mellitus  Initial Endocrine Consultative Visit: 09/21/2020    PATIENT IDENTIFIER: Ms. Alicia Phillips is a 41 y.o. female with a past medical history of DM, CAD, Hx of eating d/o, bipolar disorder. The patient has followed with Endocrinology clinic since 09/21/2020 for consultative assistance with management of her diabetes.  DIABETIC HISTORY:  Ms. Brinegar was diagnosed with DM 1986, was on an insulin pump 1990-2000. Her hemoglobin A1c has ranged from 8.0% in 2022, peaking at 9.4% in 2023.    She was followed by Dr. Everardo All between 2022 until January 2023  SUBJECTIVE:   During the last visit (08/28/2021): Saw Dr. Everardo All  Today (05/23/2022): [name]  She checks her blood sugars multiple  times daily. The patient has  had hypoglycemic episodes since the last clinic visit, which typically occur during the day after a bolus   She was prescribed mounjaro  but was not covered her her insurance  She uses inpen    HOME DIABETES REGIMEN:  Tresiba 28 uniyts QAM  Novolog (inpen)    Statin: yes ACE-I/ARB: no  CONTINUOUS GLUCOSE MONITORING RECORD INTERPRETATION    Dates of Recording: 10/2-10/15/2023  Sensor description: dexcom  Results statistics:   CGM use % of time 93  Average and SD 232/79  Time in range     23   %  % Time Above 180 40  % Time above 250 35  % Time Below target 1   Glycemic patterns summary: Hyperglycemia overnight  and during the day   Hyperglycemic episodes  all day and night   Hypoglycemic episodes occurred after a bolus   Overnight periods: high    INPEN Report  SF 45 I: C ratio 1:10    DIABETIC COMPLICATIONS: Microvascular complications:  DR (s/p laser rx) Denies:  Last Eye Exam: Completed   Macrovascular complications:  CAD Denies:  CVA, PVD   HISTORY:   Past Medical History:  Past Medical History:  Diagnosis Date   Anxiety    Bipolar disorder (HCC)    CAD (coronary artery disease)    a. LHC 04/05/20: 95% stenosis of mid LAD s/p DES, 30% stenosis of proximal to mid LAD   Diabetes mellitus type 1 (HCC)    History of borderline personality disorder    Hyperlipidemia    Major depression    STEMI (ST elevation myocardial infarction) (HCC) 04/05/2020   Tobacco use    Past Surgical History:  Past Surgical History:  Procedure Laterality Date   CORONARY STENT INTERVENTION N/A 04/05/2020   Procedure: CORONARY STENT INTERVENTION;  Surgeon: Iran Ouch, MD;  Location: [facility];  Service: Cardiovascular;  Laterality: N/A;   LEFT HEART CATH AND CORONARY ANGIOGRAPHY N/A 04/05/2020   Procedure: LEFT HEART CATH AND CORONARY ANGIOGRAPHY;  Surgeon: Iran Ouch, MD;  Location: [facility];  Service: Cardiovascular;  Laterality: N/A;   Social History:  reports that she has quit smoking. Her smoking use included cigarettes. She has never used smokeless tobacco. She reports that she does not drink alcohol and does not use drugs. Family History:  Family History  Problem Relation Age of Onset   Depression Father    Breast cancer Maternal Grandmother    Depression Brother    Anxiety disorder Brother    ADD / ADHD Brother  Alcohol abuse Brother    Alcohol abuse Maternal Grandfather    Diabetes Neg Hx      HOME MEDICATIONS: Allergies as of 05/23/2022       Reactions   Sulfamethoxazole-trimethoprim Rash        Medication List        Accurate as of May 23, 2022  8:37 AM. If you have any questions, ask your nurse or doctor.          Acetaminophen Extra Strength 500 MG Tabs Take 500 mg by mouth every 4 (four) hours as needed for mild pain or headache.   acetone (urine) test strip 1 strip by Does not apply route as needed for high blood sugar.   aspirin 81 MG chewable tablet Chew 1 tablet (81 mg total) by  mouth daily.   atorvastatin 80 MG tablet Commonly known as: LIPITOR Take 1 tablet (80 mg total) by mouth daily.   buPROPion 150 MG 24 hr tablet Commonly known as: WELLBUTRIN XL Take 150 mg by mouth every morning.   Dexcom G6 Sensor Misc USE AS DIRECT6ED   Dexcom G6 Transmitter Misc USE AS DIRECTED   doxycycline 50 MG capsule Commonly known as: MONODOX Take 50 mg by mouth daily.   elvitegravir-cobicistat-emtricitabine-tenofovir 150-150-200-10 MG Tabs tablet Commonly known as: GENVOYA Take 1 tablet by mouth daily with breakfast.   Genvoya 150-150-200-10 MG Tabs tablet Generic drug: elvitegravir-cobicistat-emtricitabine-tenofovir Take 1 tablet by mouth once daily with breakfast   FLUoxetine 20 MG capsule Commonly known as: PROZAC Take 60 mg by mouth daily.   InPen 100-Pink-Novolog-Fiasp Devi Generic drug: injection device for insulin Use with NovoLog cartridge for insulin injections   insulin aspart cartridge Commonly known as: NOVOLOG As directed, for a total of 20 units/day   lamoTRIgine 100 MG tablet Commonly known as: LaMICtal Take 1 tablet (100 mg total) by mouth at bedtime. Take one tablet at bedtime.   metoprolol succinate 50 MG 24 hr tablet Commonly known as: TOPROL-XL Take 1 tablet (50 mg total) by mouth daily.   nitroGLYCERIN 0.4 MG SL tablet Commonly known as: NITROSTAT DISSOLVE ONE TABLET UNDER TONGUE AS NEEDED FOR CHEST PAIN EVERY 5 MINUTES   Novofine Pen Needle 32G X 6 MM Misc Generic drug: Insulin Pen Needle SMARTSIG:Injection 5 Times Daily   Tresiba FlexTouch 100 UNIT/ML FlexTouch Pen Generic drug: insulin degludec ADMINISTER 28 UNITS UNDER THE SKIN DAILY         OBJECTIVE:   Vital Signs: BP 126/74 (BP Location: Left Arm, Patient Position: Sitting, Cuff Size: Large)   Pulse 80   Ht 5\' 8"  (1.727 m)   Wt 199 lb 12.8 oz (90.6 kg)   SpO2 99%   BMI 30.38 kg/m   Wt Readings from Last 3 Encounters:  05/23/22 199 lb 12.8 oz (90.6 kg)   12/11/21 198 lb (89.8 kg)  08/28/21 197 lb 3.2 oz (89.4 kg)     Exam: General: Pt appears well and is in NAD  Neck: General: Supple without adenopathy. Thyroid: Thyroid size normal.  No goiter or nodules appreciated.   Lungs: Clear with good BS bilat   Heart: RRR   Abdomen:  soft, nontender  Extremities: No pretibial edema.   Neuro: MS is good with appropriate affect, pt is alert and Ox3    DM foot exam:05/23/2022   The skin of the feet is intact without sores or ulcerations. The pedal pulses are 2+ on right and 2+ on left. The sensation is intact to a screening 5.07,  10 gram monofilament bilaterally        DATA REVIEWED:  Lab Results  Component Value Date   HGBA1C 9.4 (A) 08/28/2021   HGBA1C 8.8 (A) 05/22/2021   HGBA1C 8.2 (A) 02/20/2021    Latest Reference Range & Units 01/15/22 01:55  Sodium 135 - 145 mmol/L 134 (L)  Potassium 3.5 - 5.1 mmol/L 4.6  Chloride 98 - 111 mmol/L 105  CO2 22 - 32 mmol/L 18 (L)  Glucose 70 - 99 mg/dL 247 (H)  BUN 6 - 20 mg/dL 12  Creatinine 0.44 - 1.00 mg/dL 0.64  Calcium 8.9 - 10.3 mg/dL 8.9  Anion gap 5 - 15  11  Alkaline Phosphatase 38 - 126 U/L 112  Albumin 3.5 - 5.0 g/dL 3.7  AST 15 - 41 U/L 32  ALT 0 - 44 U/L 33  Total Protein 6.5 - 8.1 g/dL 7.6  Total Bilirubin 0.3 - 1.2 mg/dL 0.5  GFR, Estimated >60 mL/min >60    Old records , labs and images have been reviewed.   ASSESSMENT / PLAN / RECOMMENDATIONS:   1) Type 2 Diabetes Mellitus, Poorly controlled, With retinopathic and macrovascular  complications - Most recent A1c of 9.7 %. Goal A1c < 7.0 %.    -Patient continues with hyperglycemia -We were unable to download her InPen report, but I was able to look manually on the report on her phone, the patient does not bolus during the day but she tends to bolus at night with stacking of insulin -I did encourage the patient to bolus with each meal regardless of her little CHO she is consuming, she also needs to give herself  correction bolus when necessary but when she is not entering her glucose data into the) she is missing out on correction opportunities -Patient with insulin resistance, Dr. Loanne Drilling tried trying [facility] for her but this was not covered by her insurance company -We did discuss the difference between [facility] and GLP-1 agonist such as Ozempic/Trulicity.  I do prefer to try her on Ozempic at this time, cautioned against GI side effects -We also discussed that if she is unable to get the Ozempic, will consider metformin -In the meantime I will increase Tyler Aas as below -No changes to NovoLog at this time -Of note the patient does have diabetic retinopathy but it is stable, I have cautioned her against changes in vision with Ozempic, will monitor   MEDICATIONS: Increase Tresiba 30 units daily NovoLog I:C ratio 1:10 Sensitivity factor 45 Start Ozempic 0.25 mg weekly, after 6 weeks increase to 0.5 mg weekly  EDUCATION / INSTRUCTIONS: BG monitoring instructions: Patient is instructed to check her blood sugars 3 times a day, before meals. Call [facility] if: BG persistently < 70  I reviewed the Rule of 15 for the treatment of hypoglycemia in detail with the patient. Literature supplied.    2) Diabetic complications:  Eye: Does  have known diabetic retinopathy.  Neuro/ Feet: Does not have known diabetic peripheral neuropathy .  Renal: Patient does not have known baseline CKD. She   is not on an ACEI/ARB at present.     F/U in 4 months     Signed electronically by: Mack Guise, MD  [facility] Endocrin[facility]  [address] [place], [place] [address] Phone: [phone] FAX: [phone]   CC: Caren [address], [place] [address] Phone: [phone]  Fax: [phone]  Return to Endocrinology clinic as below: No future appointments.

## 2022-05-24 DIAGNOSIS — E10319 Type 1 diabetes mellitus with unspecified diabetic retinopathy without macular edema: Secondary | ICD-10-CM | POA: Insufficient documentation

## 2022-05-24 DIAGNOSIS — E109 Type 1 diabetes mellitus without complications: Secondary | ICD-10-CM | POA: Insufficient documentation

## 2022-05-24 DIAGNOSIS — E1065 Type 1 diabetes mellitus with hyperglycemia: Secondary | ICD-10-CM | POA: Insufficient documentation

## 2022-05-24 MED ORDER — INSULIN ASPART 100 UNIT/ML CARTRIDGE (PENFILL): SUBCUTANEOUS | 3 refills | Status: AC

## 2022-05-24 MED ORDER — TRESIBA FLEXTOUCH 100 UNIT/ML ~~LOC~~ SOPN: 30.0000 [IU] | PEN_INJECTOR | Freq: Every day | SUBCUTANEOUS | 3 refills | Status: AC

## 2022-05-24 MED ORDER — NOVOFINE PEN NEEDLE 32G X 6 MM MISC: 1.0000 | Freq: Four times a day (QID) | 3 refills | Status: AC

## 2022-06-07 ENCOUNTER — Other Ambulatory Visit: Payer: Self-pay

## 2022-06-07 DIAGNOSIS — E109 Type 1 diabetes mellitus without complications: Secondary | ICD-10-CM

## 2022-06-07 MED ORDER — DEXCOM G6 SENSOR MISC: 3 refills | Status: DC

## 2022-09-06 ENCOUNTER — Telehealth: Payer: Self-pay

## 2022-09-06 NOTE — Telephone Encounter (Signed)
Dexcom Transmitter needs a prior authorization

## 2022-09-11 NOTE — Telephone Encounter (Signed)
Patient has been out of Transmitter for almost a week. Please advise on PA

## 2022-09-12 ENCOUNTER — Other Ambulatory Visit (HOSPITAL_COMMUNITY): Payer: Self-pay

## 2022-09-12 ENCOUNTER — Telehealth: Payer: Self-pay | Admitting: Pharmacy Technician

## 2022-09-12 NOTE — Telephone Encounter (Signed)
Pharmacy Patient Advocate Encounter   Received notification from Pt calls msgs/CMA that prior authorization for Dexcom Transmitter is required/requested.   PA submitted on 09/12/22 to (ins) [employer] ([employer] in [place]) via [place] PA Case ID: [id number] Status is pending

## 2022-09-24 NOTE — Telephone Encounter (Signed)
PA# PA Case ID: [id number] Was filled 09/05/22

## 2022-09-30 NOTE — Progress Notes (Deleted)
Name: Alicia Phillips  Age/ Sex: 42 y.o., female   MRN/ DOB: [id number], [birth year]     PCP: Caren Macadam, MD   Reason for Endocrinology Evaluation: Type 1 Diabetes Mellitus  Initial Endocrine Consultative Visit: 09/21/2020    PATIENT IDENTIFIER: Alicia Phillips is a 42 y.o. female with a past medical history of DM, CAD, Hx of eating d/o, bipolar disorder. The patient has followed with Endocrinology clinic since 09/21/2020 for consultative assistance with management of her diabetes.  DIABETIC HISTORY:  Alicia Phillips was diagnosed with DM 1986, was on an insulin pump 1990-2000. Her hemoglobin A1c has ranged from 8.0% in 2022, peaking at 9.4% in 2023.    She was followed by Dr. Loanne Drilling between 2022 until January 2023  On her initial visit to our clinic she had an A1c of 9.7%, we adjusted basal/prandial insulin and started her on Ozempic 04/2022  SUBJECTIVE:   During the last visit (05/23/2022): A1c 9.7%  Today (09/30/2022): Alicia Phillips  She checks her blood sugars multiple  times daily. The patient has  had hypoglycemic episodes since the last clinic visit, which typically occur during the day after a bolus   She was prescribed mounjaro  but was not covered her her insurance  She uses inpen    HOME DIABETES REGIMEN:  Tresiba 30 units QAM Ozempic 0.5 mg weekly   Novolog (inpen) NovoLog I:C ratio 1:10 Sensitivity factor 45   Statin: yes ACE-I/ARB: no  CONTINUOUS GLUCOSE MONITORING RECORD INTERPRETATION    Dates of Recording: 10/2-10/15/2023  Sensor description: dexcom  Results statistics:   CGM use % of time 93  Average and SD 232/79  Time in range     23   %  % Time Above 180 40  % Time above 250 35  % Time Below target 1   Glycemic patterns summary: Hyperglycemia overnight  and during the day   Hyperglycemic episodes  all day and night   Hypoglycemic episodes occurred after a bolus   Overnight periods: high    INPEN Report  SF 45 I: C ratio  1:10    DIABETIC COMPLICATIONS: Microvascular complications:  DR (s/p laser rx) Denies:  Last Eye Exam: Completed   Macrovascular complications:  CAD Denies:  CVA, PVD   HISTORY:  Past Medical History:  Past Medical History:  Diagnosis Date   Anxiety    Bipolar disorder ([place])    CAD (coronary artery disease)    a. LHC 04/05/20: 95% stenosis of mid LAD s/p DES, 30% stenosis of proximal to mid LAD   Diabetes mellitus type 1 ([place])    History of borderline personality disorder    Hyperlipidemia    Major depression    STEMI (ST elevation myocardial infarction) ([place]) 04/05/2020   Tobacco use    Past Surgical History:  Past Surgical History:  Procedure Laterality Date   CORONARY STENT INTERVENTION N/A 04/05/2020   Procedure: CORONARY STENT INTERVENTION;  Surgeon: Wellington Hampshire, MD;  Location: [place] [facility];  Service: Cardiovascular;  Laterality: N/A;   LEFT HEART CATH AND CORONARY ANGIOGRAPHY N/A 04/05/2020   Procedure: LEFT HEART CATH AND CORONARY ANGIOGRAPHY;  Surgeon: Wellington Hampshire, MD;  Location: [place] [facility];  Service: Cardiovascular;  Laterality: N/A;   Social History:  reports that she has quit smoking. Her smoking use included cigarettes. She has never used smokeless tobacco. She reports that she does not drink alcohol and does not use drugs. Family History:  Family History  Problem Relation Age of Onset   Depression Father    Breast cancer Maternal Grandmother    Depression Brother    Anxiety disorder Brother    ADD / ADHD Brother    Alcohol abuse Brother    Alcohol abuse Maternal Grandfather    Diabetes Neg Hx      HOME MEDICATIONS: Allergies as of 10/01/2022       Reactions   Sulfamethoxazole-trimethoprim Rash        Medication List        Accurate as of September 30, 2022  9:11 AM. If you have any questions, ask your nurse or doctor.          Acetaminophen Extra Strength 500 MG Tabs Take 500 mg by mouth every 4 (four) hours as needed  for mild pain or headache.   acetone (urine) test strip 1 strip by Does not apply route as needed for high blood sugar.   aspirin 81 MG chewable tablet Chew 1 tablet (81 mg total) by mouth daily.   atorvastatin 80 MG tablet Commonly known as: LIPITOR Take 1 tablet (80 mg total) by mouth daily.   buPROPion 150 MG 24 hr tablet Commonly known as: WELLBUTRIN XL Take 150 mg by mouth every morning.   Dexcom G6 Sensor Misc Change sensor every 10 days   Dexcom G6 Transmitter Misc 1 Device by Other route as directed.   doxycycline 50 MG capsule Commonly known as: MONODOX Take 50 mg by mouth daily.   elvitegravir-cobicistat-emtricitabine-tenofovir 150-150-200-10 MG Tabs tablet Commonly known as: GENVOYA Take 1 tablet by mouth daily with breakfast.   Genvoya 150-150-200-10 MG Tabs tablet Generic drug: elvitegravir-cobicistat-emtricitabine-tenofovir Take 1 tablet by mouth once daily with breakfast   FLUoxetine 20 MG capsule Commonly known as: PROZAC Take 60 mg by mouth daily.   InPen 100-Pink-Novolog-Fiasp Devi Generic drug: injection device for insulin Use with NovoLog cartridge for insulin injections   insulin aspart cartridge Commonly known as: NOVOLOG Max daily 30 units   lamoTRIgine 100 MG tablet Commonly known as: LaMICtal Take 1 tablet (100 mg total) by mouth at bedtime. Take one tablet at bedtime.   metoprolol succinate 50 MG 24 hr tablet Commonly known as: TOPROL-XL Take 1 tablet (50 mg total) by mouth daily.   nitroGLYCERIN 0.4 MG SL tablet Commonly known as: NITROSTAT DISSOLVE ONE TABLET UNDER TONGUE AS NEEDED FOR CHEST PAIN EVERY 5 MINUTES   Novofine Pen Needle 32G X 6 MM Misc Generic drug: Insulin Pen Needle 1 Device by Other route in the morning, at noon, in the evening, and at bedtime.   Semaglutide(0.25 or 0.'5MG'$ /DOS) 2 MG/3ML Sopn Inject 0.5 mg into the skin once a week.   Tyler Aas FlexTouch 100 UNIT/ML FlexTouch Pen Generic drug: insulin  degludec Inject 30 Units into the skin daily.         OBJECTIVE:   Vital Signs: There were no vitals taken for this visit.  Wt Readings from Last 3 Encounters:  05/23/22 199 lb 12.8 oz (90.6 kg)  12/11/21 198 lb (89.8 kg)  08/28/21 197 lb 3.2 oz (89.4 kg)     Exam: General: Pt appears well and is in NAD  Neck: General: Supple without adenopathy. Thyroid: Thyroid size normal.  No goiter or nodules appreciated.   Lungs: Clear with good BS bilat   Heart: RRR   Abdomen:  soft, nontender  Extremities: No pretibial edema.   Neuro: MS is good with appropriate affect, pt is alert and Ox3    DM  foot exam:05/23/2022   The skin of the feet is intact without sores or ulcerations. The pedal pulses are 2+ on right and 2+ on left. The sensation is intact to a screening 5.07, 10 gram monofilament bilaterally        DATA REVIEWED:  Lab Results  Component Value Date   HGBA1C 9.7 (A) 05/23/2022   HGBA1C 9.4 (A) 08/28/2021   HGBA1C 8.8 (A) 05/22/2021    Latest Reference Range & Units 01/15/22 01:55  Sodium 135 - 145 mmol/L 134 (L)  Potassium 3.5 - 5.1 mmol/L 4.6  Chloride 98 - 111 mmol/L 105  CO2 22 - 32 mmol/L 18 (L)  Glucose 70 - 99 mg/dL 247 (H)  BUN 6 - 20 mg/dL 12  Creatinine 0.44 - 1.00 mg/dL 0.64  Calcium 8.9 - 10.3 mg/dL 8.9  Anion gap 5 - 15  11  Alkaline Phosphatase 38 - 126 U/L 112  Albumin 3.5 - 5.0 g/dL 3.7  AST 15 - 41 U/L 32  ALT 0 - 44 U/L 33  Total Protein 6.5 - 8.1 g/dL 7.6  Total Bilirubin 0.3 - 1.2 mg/dL 0.5  GFR, Estimated >60 mL/min >60    Old records , labs and images have been reviewed.   ASSESSMENT / PLAN / RECOMMENDATIONS:   1) Type 2 Diabetes Mellitus, Poorly controlled, With retinopathic and macrovascular  complications - Most recent A1c of 9.7 %. Goal A1c < 7.0 %.    -Patient continues with hyperglycemia -We were unable to download her InPen report, but I was able to look manually on the report on her phone, the patient does not bolus  during the day but she tends to bolus at night with stacking of insulin -I did encourage the patient to bolus with each meal regardless of her little CHO she is consuming, she also needs to give herself correction bolus when necessary but when she is not entering her glucose data into the) she is missing out on correction opportunities -Patient with insulin resistance, Dr. Loanne Drilling tried trying [facility] for her but this was not covered by her insurance company -We did discuss the difference between [facility] and GLP-1 agonist such as Ozempic/Trulicity.  I do prefer to try her on Ozempic at this time, cautioned against GI side effects -We also discussed that if she is unable to get the Ozempic, will consider metformin -In the meantime I will increase Tyler Aas as below -No changes to NovoLog at this time -Of note the patient does have diabetic retinopathy but it is stable, I have cautioned her against changes in vision with Ozempic, will monitor   MEDICATIONS: Increase Tresiba 30 units daily NovoLog I:C ratio 1:10 Sensitivity factor 45 Start Ozempic 0.25 mg weekly, after 6 weeks increase to 0.5 mg weekly  EDUCATION / INSTRUCTIONS: BG monitoring instructions: Patient is instructed to check her blood sugars 3 times a day, before meals. Call [facility] if: BG persistently < 70  I reviewed the Rule of 15 for the treatment of hypoglycemia in detail with the patient. Literature supplied.    2) Diabetic complications:  Eye: Does  have known diabetic retinopathy.  Neuro/ Feet: Does not have known diabetic peripheral neuropathy .  Renal: Patient does not have known baseline CKD. She   is not on an ACEI/ARB at present.     F/U in 4 months     Signed electronically by: Mack Guise, MD  [facility] Endocrinology  [address] [place] [address],  [place] [address] Phone: [phone] FAX: [phone]   CC: [address], [place] [address] Phone: [phone]  Fax: [phone]  Return to Endocrinology clinic as below: Future Appointments  Date Time Provider [place]  10/01/2022  9:10 AM Takeya Marquis, Melanie Crazier, MD [id number] None

## 2022-10-01 ENCOUNTER — Ambulatory Visit: Payer: BC Managed Care – PPO | Admitting: Internal Medicine

## 2022-10-11 ENCOUNTER — Ambulatory Visit: Payer: BC Managed Care – PPO | Admitting: Internal Medicine

## 2022-10-11 NOTE — Progress Notes (Deleted)
Name: Alicia Phillips  Age/ Sex: 42 y.o., female   MRN/ DOB: [id number], 04-03-1981     PCP: Caren Macadam, MD   Reason for Endocrinology Evaluation: Type 1 Diabetes Mellitus  Initial Endocrine Consultative Visit: 09/21/2020    PATIENT IDENTIFIER: Alicia Phillips is a 42 y.o. female with a past medical history of DM, CAD, Hx of eating d/o, bipolar disorder. The patient has followed with Endocrinology clinic since 09/21/2020 for consultative assistance with management of her diabetes.  DIABETIC HISTORY:  Ms. Leath was diagnosed with DM 1986, was on an insulin pump 1990-2000. Her hemoglobin A1c has ranged from 8.0% in 2022, peaking at 9.4% in 2023.    She was followed by Dr. Loanne Drilling between 2022 until January 2023  On her initial visit to our clinic she had an A1c of 9.7%, we adjusted basal/prandial insulin and started her on Ozempic 04/2022  SUBJECTIVE:   During the last visit (05/23/2022): A1c 9.7%  Today (10/11/2022): Alicia Phillips  She checks her blood sugars multiple  times daily. The patient has  had hypoglycemic episodes since the last clinic visit, which typically occur during the day after a bolus   She was prescribed mounjaro  but was not covered her her insurance  She uses inpen    HOME DIABETES REGIMEN:  Tresiba 30 units QAM Ozempic 0.5 mg weekly   Novolog (inpen) NovoLog I:C ratio 1:10 Sensitivity factor 45   Statin: yes ACE-I/ARB: no  CONTINUOUS GLUCOSE MONITORING RECORD INTERPRETATION    Dates of Recording: 10/2-10/15/2023  Sensor description: dexcom  Results statistics:   CGM use % of time 93  Average and SD 232/79  Time in range     23   %  % Time Above 180 40  % Time above 250 35  % Time Below target 1   Glycemic patterns summary: Hyperglycemia overnight  and during the day   Hyperglycemic episodes  all day and night   Hypoglycemic episodes occurred after a bolus   Overnight periods: high    INPEN Report  SF 45 I: C ratio  1:10    DIABETIC COMPLICATIONS: Microvascular complications:  DR (s/p laser rx) Denies:  Last Eye Exam: Completed   Macrovascular complications:  CAD Denies:  CVA, PVD   HISTORY:  Past Medical History:  Past Medical History:  Diagnosis Date   Anxiety    Bipolar disorder ([place])    CAD (coronary artery disease)    a. LHC 04/05/20: 95% stenosis of mid LAD s/p DES, 30% stenosis of proximal to mid LAD   Diabetes mellitus type 1 ([place])    History of borderline personality disorder    Hyperlipidemia    Major depression    STEMI (ST elevation myocardial infarction) ([place]) 04/05/2020   Tobacco use    Past Surgical History:  Past Surgical History:  Procedure Laterality Date   CORONARY STENT INTERVENTION N/A 04/05/2020   Procedure: CORONARY STENT INTERVENTION;  Surgeon: Wellington Hampshire, MD;  Location: [facility];  Service: Cardiovascular;  Laterality: N/A;   LEFT HEART CATH AND CORONARY ANGIOGRAPHY N/A 04/05/2020   Procedure: LEFT HEART CATH AND CORONARY ANGIOGRAPHY;  Surgeon: Wellington Hampshire, MD;  Location: [facility];  Service: Cardiovascular;  Laterality: N/A;   Social History:  reports that she has quit smoking. Her smoking use included cigarettes. She has never used smokeless tobacco. She reports that she does not drink alcohol and does not use drugs. Family History:  Family History  Problem Relation Age of Onset   Depression Father    Breast cancer Maternal Grandmother    Depression Brother    Anxiety disorder Brother    ADD / ADHD Brother    Alcohol abuse Brother    Alcohol abuse Maternal Grandfather    Diabetes Neg Hx      HOME MEDICATIONS: Allergies as of 10/11/2022       Reactions   Sulfamethoxazole-trimethoprim Rash        Medication List        Accurate as of October 11, 2022  7:12 AM. If you have any questions, ask your nurse or doctor.          Acetaminophen Extra Strength 500 MG Tabs Take 500 mg by mouth every 4 (four) hours as  needed for mild pain or headache.   acetone (urine) test strip 1 strip by Does not apply route as needed for high blood sugar.   aspirin 81 MG chewable tablet Chew 1 tablet (81 mg total) by mouth daily.   atorvastatin 80 MG tablet Commonly known as: LIPITOR Take 1 tablet (80 mg total) by mouth daily.   buPROPion 150 MG 24 hr tablet Commonly known as: WELLBUTRIN XL Take 150 mg by mouth every morning.   Dexcom G6 Sensor Misc Change sensor every 10 days   Dexcom G6 Transmitter Misc 1 Device by Other route as directed.   doxycycline 50 MG capsule Commonly known as: MONODOX Take 50 mg by mouth daily.   elvitegravir-cobicistat-emtricitabine-tenofovir 150-150-200-10 MG Tabs tablet Commonly known as: GENVOYA Take 1 tablet by mouth daily with breakfast.   Genvoya 150-150-200-10 MG Tabs tablet Generic drug: elvitegravir-cobicistat-emtricitabine-tenofovir Take 1 tablet by mouth once daily with breakfast   FLUoxetine 20 MG capsule Commonly known as: PROZAC Take 60 mg by mouth daily.   InPen 100-Pink-Novolog-Fiasp Devi Generic drug: injection device for insulin Use with NovoLog cartridge for insulin injections   insulin aspart cartridge Commonly known as: NOVOLOG Max daily 30 units   lamoTRIgine 100 MG tablet Commonly known as: LaMICtal Take 1 tablet (100 mg total) by mouth at bedtime. Take one tablet at bedtime.   metoprolol succinate 50 MG 24 hr tablet Commonly known as: TOPROL-XL Take 1 tablet (50 mg total) by mouth daily.   nitroGLYCERIN 0.4 MG SL tablet Commonly known as: NITROSTAT DISSOLVE ONE TABLET UNDER TONGUE AS NEEDED FOR CHEST PAIN EVERY 5 MINUTES   Novofine Pen Needle 32G X 6 MM Misc Generic drug: Insulin Pen Needle 1 Device by Other route in the morning, at noon, in the evening, and at bedtime.   Semaglutide(0.25 or 0.5MG /DOS) 2 MG/3ML Sopn Inject 0.5 mg into the skin once a week.   Tyler Aas FlexTouch 100 UNIT/ML FlexTouch Pen Generic drug: insulin  degludec Inject 30 Units into the skin daily.         OBJECTIVE:   Vital Signs: There were no vitals taken for this visit.  Wt Readings from Last 3 Encounters:  05/23/22 199 lb 12.8 oz (90.6 kg)  12/11/21 198 lb (89.8 kg)  08/28/21 197 lb 3.2 oz (89.4 kg)     Exam: General: Pt appears well and is in NAD  Neck: General: Supple without adenopathy. Thyroid: Thyroid size normal.  No goiter or nodules appreciated.   Lungs: Clear with good BS bilat   Heart: RRR   Abdomen:  soft, nontender  Extremities: No pretibial edema.   Neuro: MS is good with appropriate affect, pt is alert and Ox3    DM  foot exam:05/23/2022   The skin of the feet is intact without sores or ulcerations. The pedal pulses are 2+ on right and 2+ on left. The sensation is intact to a screening 5.07, 10 gram monofilament bilaterally        DATA REVIEWED:  Lab Results  Component Value Date   HGBA1C 9.7 (A) 05/23/2022   HGBA1C 9.4 (A) 08/28/2021   HGBA1C 8.8 (A) 05/22/2021    Latest Reference Range & Units 01/15/22 01:55  Sodium 135 - 145 mmol/L 134 (L)  Potassium 3.5 - 5.1 mmol/L 4.6  Chloride 98 - 111 mmol/L 105  CO2 22 - 32 mmol/L 18 (L)  Glucose 70 - 99 mg/dL 247 (H)  BUN 6 - 20 mg/dL 12  Creatinine 0.44 - 1.00 mg/dL 0.64  Calcium 8.9 - 10.3 mg/dL 8.9  Anion gap 5 - 15  11  Alkaline Phosphatase 38 - 126 U/L 112  Albumin 3.5 - 5.0 g/dL 3.7  AST 15 - 41 U/L 32  ALT 0 - 44 U/L 33  Total Protein 6.5 - 8.1 g/dL 7.6  Total Bilirubin 0.3 - 1.2 mg/dL 0.5  GFR, Estimated >60 mL/min >60    Old records , labs and images have been reviewed.   ASSESSMENT / PLAN / RECOMMENDATIONS:   1) Type 2 Diabetes Mellitus, Poorly controlled, With retinopathic and macrovascular  complications - Most recent A1c of 9.7 %. Goal A1c < 7.0 %.    -Patient continues with hyperglycemia -We were unable to download her InPen report, but I was able to look manually on the report on her phone, the patient does not bolus  during the day but she tends to bolus at night with stacking of insulin -I did encourage the patient to bolus with each meal regardless of her little CHO she is consuming, she also needs to give herself correction bolus when necessary but when she is not entering her glucose data into the) she is missing out on correction opportunities -Patient with insulin resistance, Dr. Loanne Drilling tried trying [facility] for her but this was not covered by her insurance company -We did discuss the difference between [facility] and GLP-1 agonist such as Ozempic/Trulicity.  I do prefer to try her on Ozempic at this time, cautioned against GI side effects -We also discussed that if she is unable to get the Ozempic, will consider metformin -In the meantime I will increase Tyler Aas as below -No changes to NovoLog at this time -Of note the patient does have diabetic retinopathy but it is stable, I have cautioned her against changes in vision with Ozempic, will monitor   MEDICATIONS: Increase Tresiba 30 units daily NovoLog I:C ratio 1:10 Sensitivity factor 45 Start Ozempic 0.25 mg weekly, after 6 weeks increase to 0.5 mg weekly  EDUCATION / INSTRUCTIONS: BG monitoring instructions: Patient is instructed to check her blood sugars 3 times a day, before meals. Call [facility] if: BG persistently < 70  I reviewed the Rule of 15 for the treatment of hypoglycemia in detail with the patient. Literature supplied.    2) Diabetic complications:  Eye: Does  have known diabetic retinopathy.  Neuro/ Feet: Does not have known diabetic peripheral neuropathy .  Renal: Patient does not have known baseline CKD. She   is not on an ACEI/ARB at present.     F/U in 4 months     Signed electronically by: Mack Guise, MD  [facility]  [address],  [place] [address] Phone: [phone] FAX: [phone]   CC: Caren Macadam, [place] [address] Phone: [phone]  Fax: [phone]  Return to Endocrinology clinic as below: Future Appointments  Date Time Provider [place]  10/11/2022  8:10 AM Donaldson Richter, Melanie Crazier, MD [facility] None

## 2022-12-14 ENCOUNTER — Other Ambulatory Visit: Payer: Self-pay | Admitting: Cardiovascular Disease

## 2022-12-14 DIAGNOSIS — R002 Palpitations: Secondary | ICD-10-CM

## 2022-12-16 ENCOUNTER — Other Ambulatory Visit: Payer: Self-pay | Admitting: Cardiovascular Disease

## 2022-12-16 DIAGNOSIS — R002 Palpitations: Secondary | ICD-10-CM

## 2022-12-21 ENCOUNTER — Other Ambulatory Visit: Payer: Self-pay | Admitting: Internal Medicine

## 2023-01-23 ENCOUNTER — Other Ambulatory Visit: Payer: Self-pay | Admitting: Cardiovascular Disease

## 2023-01-23 DIAGNOSIS — R002 Palpitations: Secondary | ICD-10-CM

## 2023-02-12 ENCOUNTER — Telehealth: Payer: Self-pay

## 2023-02-12 ENCOUNTER — Other Ambulatory Visit: Payer: Self-pay | Admitting: Endocrinology

## 2023-02-12 MED ORDER — NOVOLOG FLEXPEN 100 UNIT/ML ~~LOC~~ SOPN: PEN_INJECTOR | SUBCUTANEOUS | 0 refills | Status: AC

## 2023-02-12 NOTE — Telephone Encounter (Signed)
Per [facility] inpen is on back order and patient is out of insulin.  Please advise on what insulin pen to send in and what dose.Marland Kitchen

## 2023-02-12 NOTE — Telephone Encounter (Signed)
Prescription has been sent to pharmacy.

## 2023-02-13 NOTE — Telephone Encounter (Signed)
scheduled for 03/06/23 at 8:10 am and also put on wait list

## 2023-02-17 ENCOUNTER — Telehealth: Payer: Self-pay | Admitting: Emergency Medicine

## 2023-02-17 NOTE — Telephone Encounter (Signed)
Patient c/o Palpitations:  High priority if patient c/o lightheadedness, shortness of breath, or chest pain  How long have you had palpitations/irregular HR/ Afib? Are you having the symptoms now? Yes   Are you currently experiencing lightheadedness, SOB or CP? Lightheadedness   Do you have a history of afib (atrial fibrillation) or irregular heart rhythm? Yes, had double beats prior to her past heart attack and states that this feels like it did at that time.    Have you checked your BP or HR? (document readings if available): No   Are you experiencing any other symptoms? Patient states that last time she experienced a heart attack, she had these same symptoms and double beat. Denies chest pain and says there was no CP with last heart attack. Requesting to know if they should go to ED or come in for lab work.

## 2023-02-17 NOTE — Telephone Encounter (Signed)
Spoke with the patient who states that over the weekend she has had increased palpitations. She states that she gets palpitations occasionally but over the weekend they became very frequent. Patient states that prior to her heart attack she was having palpitations so she is very anxious about it. She denies any chest pain, shortness of breath, dizziness/lightheadedness, or N/V. She states that prior to her previous heart attack her only symptom was palpitations, back pain and shortness of breath. She denies any current back pain or shortness of breath. She has not been taking her blood pressure or heart rate. She is taking metoprolol as prescribed. She denies any increased caffeine intake. She states that she had two beers on Saturday. Only recent change in medication has been addition of wellbutrin.  Patient is aware of nitroglycerin use and ER precautions. She has been scheduled for an appointment to see APP next week. Advised that I will make Dr. Allyson Sabal aware for any further recommendations.

## 2023-02-19 ENCOUNTER — Ambulatory Visit: Payer: BC Managed Care – PPO | Admitting: Internal Medicine

## 2023-02-23 ENCOUNTER — Other Ambulatory Visit: Payer: Self-pay | Admitting: Cardiovascular Disease

## 2023-02-23 DIAGNOSIS — R002 Palpitations: Secondary | ICD-10-CM

## 2023-02-24 NOTE — Progress Notes (Unsigned)
Cardiology Clinic Note   Patient Name: Alicia Phillips Date of Encounter: 02/27/2023  Primary Care Provider:  Aliene Beams, MD Primary Cardiologist:  Alicia Batty, MD  Patient Profile    42 year old female with a history of type 1 diabetes since 1986 tobacco abuse, CAD, HL for OSA, who presented on 05/05/2020 with an anterior non-STEMI.   She underwent diagnostic coronary angiography the same day by Dr. Kirke Phillips found a 95% mid LAD lesion that was stented with a 2.25 mm x 18 mm long resolute Onyx drug-eluting stent.  She had no other significant CAD.  LV function was normal.   Past Medical History    Past Medical History:  Diagnosis Date   Anxiety    Bipolar disorder ([facility])    CAD (coronary artery disease)    a. LHC 04/05/20: 95% stenosis of mid LAD s/p DES, 30% stenosis of proximal to mid LAD   Diabetes mellitus type 1 ([facility])    History of borderline personality disorder    Hyperlipidemia    Major depression    STEMI (ST elevation myocardial infarction) ([facility]) 04/05/2020   Tobacco use    Past Surgical History:  Procedure Laterality Date   CORONARY STENT INTERVENTION N/A 04/05/2020   Procedure: CORONARY STENT INTERVENTION;  Surgeon: Alicia Ouch, MD;  Location: [facility];  Service: Cardiovascular;  Laterality: N/A;   LEFT HEART CATH AND CORONARY ANGIOGRAPHY N/A 04/05/2020   Procedure: LEFT HEART CATH AND CORONARY ANGIOGRAPHY;  Surgeon: Alicia Ouch, MD;  Location: [facility];  Service: Cardiovascular;  Laterality: N/A;    Allergies  Allergies  Allergen Reactions   Sulfamethoxazole-Trimethoprim Rash    History of Present Illness    Alicia Phillips returns to the office today after being seen in the ED 5 hours earlier for complaints of palpitation.  The patient has a history of coronary artery disease, type 2 diabetes ongoing tobacco abuse anxiety and bipolar disorder.  She admits to drinking a good bit of caffeine lately, and did drink an energy drink, and 2  beers yesterday during and after a very stressful time at work.  As result of this she had increased palpitations.  She states she had had palpitations prior to her chest pain requiring stents which causes her good bit of anxiety when these occur.  2 of the ED records did not show any evidence of CBC on EKG.  She remained in normal sinus rhythm.  Troponins were found to be negative and she was ruled out for ACS.  She was to follow-up with cardiology today.  Home Medications    Current Outpatient Medications  Medication Sig Dispense Refill   ACETAMINOPHEN EXTRA STRENGTH 500 MG tablet Take 500 mg by mouth every 4 (four) hours as needed for mild pain or headache.      acetone, urine, test strip 1 strip by Does not apply route as needed for high blood sugar. 25 each 0   aspirin 81 MG chewable tablet Chew 1 tablet (81 mg total) by mouth daily.     atorvastatin (LIPITOR) 80 MG tablet Take 1 tablet (80 mg total) by mouth daily. 90 tablet 2   buPROPion (WELLBUTRIN XL) 150 MG 24 hr tablet Take 150 mg by mouth every morning.     Continuous Blood Gluc Sensor (DEXCOM G6 SENSOR) MISC Change sensor every 10 days 9 each 3   Continuous Glucose Transmitter (DEXCOM G6 TRANSMITTER) MISC USE 1 DEVICE BY OTHER ROUTE AS DIRECTED 1 each  0   doxycycline (MONODOX) 50 MG capsule Take 50 mg by mouth daily.     elvitegravir-cobicistat-emtricitabine-tenofovir (GENVOYA) 150-150-200-10 MG TABS tablet Take 1 tablet by mouth daily with breakfast. 30 tablet 0   elvitegravir-cobicistat-emtricitabine-tenofovir (GENVOYA) 150-150-200-10 MG TABS tablet Take 1 tablet by mouth once daily with breakfast 30 tablet 0   FLUoxetine (PROZAC) 20 MG capsule Take 60 mg by mouth daily.     injection device for insulin (INPEN 100-PINK-NOVOLOG-FIASP) DEVI Use with NovoLog cartridge for insulin injections 1 each 2   insulin aspart (NOVOLOG FLEXPEN) 100 UNIT/ML FlexPen Max daily dose 30 units 3 mL 0   insulin aspart (NOVOLOG) cartridge Max daily 30  units 30 mL 3   lamoTRIgine (LAMICTAL) 100 MG tablet Take 1 tablet (100 mg total) by mouth at bedtime. Take one tablet at bedtime. 30 tablet 1   metoprolol succinate (TOPROL-XL) 50 MG 24 hr tablet TAKE 1 TABLET(50 MG) BY MOUTH DAILY 15 tablet 0   NOVOFINE PEN NEEDLE 32G X 6 MM MISC 1 Device by Other route in the morning, at noon, in the evening, and at bedtime. 400 each 3   Semaglutide,0.25 or 0.5MG /DOS, 2 MG/3ML SOPN Inject 0.5 mg into the skin once a week. 3 mL 11   TRESIBA FLEXTOUCH 100 UNIT/ML FlexTouch Pen Inject 30 Units into the skin daily. 30 mL 3   nitroGLYCERIN (NITROSTAT) 0.4 MG SL tablet DISSOLVE ONE TABLET UNDER TONGUE AS NEEDED FOR CHEST PAIN EVERY 5 MINUTES (Patient not taking: Reported on 02/27/2023) 25 tablet 2   No current facility-administered medications for this visit.     Family History    Family History  Problem Relation Age of Onset   Depression Father    Breast cancer Maternal Grandmother    Depression Brother    Anxiety disorder Brother    ADD / ADHD Brother    Alcohol abuse Brother    Alcohol abuse Maternal Grandfather    Diabetes Neg Hx    She indicated that her mother is alive. She indicated that her father is alive. She indicated that the status of her brother is unknown. She indicated that her maternal grandmother is deceased. She indicated that the status of her maternal grandfather is unknown. She indicated that the status of her neg hx is unknown.  Social History    Social History   Socioeconomic History   Marital status: Single    Spouse name: Not on file   Number of children: Not on file   Years of education: Not on file   Highest education level: Not on file  Occupational History   Not on file  Tobacco Use   Smoking status: Former    Types: Cigarettes   Smokeless tobacco: Never   Tobacco comments:    patient stated she was smoking but has quit since 05/30/20  Vaping Use   Vaping status: Never Used  Substance and Sexual Activity    Alcohol use: Never   Drug use: Never   Sexual activity: Not on file  Other Topics Concern   Not on file  Social History Narrative   Not on file   Social Determinants of Health   Financial Resource Strain: Not on file  Food Insecurity: Not on file  Transportation Needs: Not on file  Physical Activity: Not on file  Stress: Not on file  Social Connections: Not on file  Intimate Partner Violence: Not on file     Review of Systems    General:  No chills, fever,  night sweats or weight changes.  Cardiovascular:  No chest pain, positive for increased palpitations, dyspnea on exertion, edema, orthopnea, paroxysmal nocturnal dyspnea. Dermatological: No rash, lesions/masses Respiratory: No cough, dyspnea Urologic: No hematuria, dysuria Abdominal:   No nausea, vomiting, diarrhea, bright red blood per rectum, melena, or hematemesis Neurologic:  No visual changes, wkns, changes in mental status.  Positive for worsening anxiety, and stress. All other systems reviewed and are otherwise negative except as noted above.       Physical Exam    VS:  BP 118/76   Pulse 84   Ht 5\' 8"  (1.727 m)   Wt 203 lb 3.2 oz (92.2 kg)   LMP 02/21/2023   SpO2 97%   BMI 30.90 kg/m  , BMI Body mass index is 30.9 kg/m.     GEN: Well nourished, well developed, in no acute distress. HEENT: normal.  Wearing glasses. Neck: Supple, no JVD, carotid bruits, or masses. Cardiac: RRR, no murmurs, rubs, or gallops. No clubbing, cyanosis, edema.  Radials/DP/PT 2+ and equal bilaterally.  Respiratory:  Respirations regular and unlabored, clear to auscultation bilaterally. GI: Soft, nontender, nondistended, BS + x 4. MS: no deformity or atrophy. Skin: warm and dry, no rash. Neuro:  Strength and sensation are intact. Psych: Normal affect.      Lab Results  Component Value Date   WBC 8.7 02/26/2023   HGB 13.0 02/26/2023   HCT 39.4 02/26/2023   MCV 91.6 02/26/2023   PLT 325 02/26/2023   Lab Results  Component  Value Date   CREATININE 0.65 02/26/2023   BUN 9 02/26/2023   NA 134 (L) 02/26/2023   K 3.9 02/26/2023   CL 101 02/26/2023   CO2 21 (L) 02/26/2023   Lab Results  Component Value Date   ALT 33 01/15/2022   AST 32 01/15/2022   ALKPHOS 112 01/15/2022   BILITOT 0.5 01/15/2022   Lab Results  Component Value Date   CHOL 123 08/09/2020   HDL 53 08/09/2020   LDLCALC 53 08/09/2020   TRIG 87 08/09/2020   CHOLHDL 2.3 08/09/2020    Lab Results  Component Value Date   HGBA1C 9.7 (A) 05/23/2022     Review of Prior Studies    Echocardiogram 04/06/2020  1. Left ventricular ejection fraction, by estimation, is 60 to 65%. The  left ventricle has normal function. The left ventricle has no regional  wall motion abnormalities. Left ventricular diastolic parameters were  normal.   2. Right ventricular systolic function is normal. The right ventricular  size is normal. Tricuspid regurgitation signal is inadequate for assessing  PA pressure.   3. The mitral valve is normal in structure. No evidence of mitral valve  regurgitation. No evidence of mitral stenosis.   4. The aortic valve is tricuspid. Aortic valve regurgitation is not  visualized. No aortic stenosis is present.   5. The inferior vena cava is normal in size with greater than 50%  respiratory variability, suggesting right atrial pressure of 3 mmHg.    LHC 04/05/2020 Dr.Arida   There is mild left ventricular systolic dysfunction. LV end diastolic pressure is normal. Prox LAD to Mid LAD lesion is 30% stenosed. Mid LAD lesion is 95% stenosed. Post intervention, there is a 0% residual stenosis. A drug-eluting stent was successfully placed using a STENT RESOLUTE ONYX 2.25X18.   1.  Severe one-vessel coronary artery disease with 95% stenosis in the mid LAD just before the origin of the diagonal with evidence of thrombotic plaque rupture.  No other obstructive disease. 2.  Mildly reduced LV systolic function with distal and apical wall  hypokinesis.  Normal LVEDP. 3.  Successful angioplasty and drug-eluting stent placement to the mid LAD.   Recommendations: Dual antiplatelet therapy for at least 1 year.  Assessment & Plan   1.  Coronary artery disease: History of anterior NSTEMI with 95% mid LAD lesion with DES in 2021.  She denies recurrent chest pain but has been having increased palpitations which she states occurred prior to her MI.  Each time she has to palpitations she states that these are reminiscent of her prior cardiac event which causes a good bit of anxiety.  She may continue secondary prevention with decreased caffeine, statin therapy, purposeful exercise, and weight loss along with control of diabetes.  She also admits to increased caffeine intake, with energy drink and espressos.  She is SSRI QT interval was found to be normal.  There was some consideration to increase SSRI dosing.  Will defer to psychiatry being mindful of QT interval.  2. Palpitations I will place a 2-week ZIO monitor to evaluate frequency and morphology of palpitations.  She is advised on decreasing her caffeine slowly and not going cold [place] to avoid headache anxiety and worsening palpitations.  She is also to practice mindfulness when she becomes anxious during stressful periods.  She is to continue with primary care and psychotherapist.  3. Hyperlipidemia: She is to continue statin therapy with goal of LDL less than 70.  She will need follow-up fasting lipids and LFTs if not completed by primary care  4.   Type 2 diabetes: Followed by primary care.       Signed, Bettey Mare. Liborio Nixon, ANP, AACC   02/27/2023 10:31 AM      Office [phone] Fax [phone]  Notice: This dictation was prepared with Dragon dictation along with smaller phrase technology. Any transcriptional errors that result from this process are unintentional and may not be corrected upon review.

## 2023-02-25 ENCOUNTER — Encounter: Payer: Self-pay | Admitting: Internal Medicine

## 2023-02-25 ENCOUNTER — Ambulatory Visit: Payer: BC Managed Care – PPO | Admitting: Internal Medicine

## 2023-02-25 NOTE — Progress Notes (Deleted)
Name: Alicia Phillips  Age/ Sex: 42 y.o., female   MRN/ DOB: [id number], 05-Apr-1981     PCP: Aliene Beams, MD   Reason for Endocrinology Evaluation: Type 1 Diabetes Mellitus  Initial Endocrine Consultative Visit: 09/21/2020    PATIENT IDENTIFIER: Alicia Phillips is a 42 y.o. female with a past medical history of DM, CAD, Hx of eating d/o, bipolar disorder. The patient has followed with Endocrinology clinic since 09/21/2020 for consultative assistance with management of her diabetes.  DIABETIC HISTORY:  Alicia Phillips was diagnosed with DM 1986, was on an insulin pump 1990-2000. Her hemoglobin A1c has ranged from 8.0% in 2022, peaking at 9.4% in 2023.    She was followed by Dr. Everardo All between 2022 until January 2023   On her initial visit with me 04/2022 she had an A1c of 9.7%, I started Ozempic  SUBJECTIVE:   During the last visit (05/23/2022): A1c 9.7%  Today (02/25/2023): Alicia Phillips  She checks her blood sugars multiple  times daily. The patient has  had hypoglycemic episodes since the last clinic visit, which typically occur during the day after a bolus   She was prescribed mounjaro  but was not covered her her insurance  She uses inpen    HOME DIABETES REGIMEN:  Tresiba 30 units QAM  Novolog (inpen) I:C ratio 1:10 SF 45 Ozempic 0.5 mg weekly    Statin: yes ACE-I/ARB: no  CONTINUOUS GLUCOSE MONITORING RECORD INTERPRETATION    Dates of Recording: 10/2-10/15/2023  Sensor description: dexcom  Results statistics:   CGM use % of time 93  Average and SD 232/79  Time in range     23   %  % Time Above 180 40  % Time above 250 35  % Time Below target 1   Glycemic patterns summary: Hyperglycemia overnight  and during the day   Hyperglycemic episodes  all day and night   Hypoglycemic episodes occurred after a bolus   Overnight periods: high    INPEN Report  SF 45 I: C ratio 1:10    DIABETIC COMPLICATIONS: Microvascular complications:  DR (s/p  laser rx) Denies:  Last Eye Exam: Completed   Macrovascular complications:  CAD Denies:  CVA, PVD   HISTORY:  Past Medical History:  Past Medical History:  Diagnosis Date   Anxiety    Bipolar disorder (HCC)    CAD (coronary artery disease)    a. LHC 04/05/20: 95% stenosis of mid LAD s/p DES, 30% stenosis of proximal to mid LAD   Diabetes mellitus type 1 (HCC)    History of borderline personality disorder    Hyperlipidemia    Major depression    STEMI (ST elevation myocardial infarction) (HCC) 04/05/2020   Tobacco use    Past Surgical History:  Past Surgical History:  Procedure Laterality Date   CORONARY STENT INTERVENTION N/A 04/05/2020   Procedure: CORONARY STENT INTERVENTION;  Surgeon: Iran Ouch, MD;  Location: [facility];  Service: Cardiovascular;  Laterality: N/A;   LEFT HEART CATH AND CORONARY ANGIOGRAPHY N/A 04/05/2020   Procedure: LEFT HEART CATH AND CORONARY ANGIOGRAPHY;  Surgeon: Iran Ouch, MD;  Location: [facility];  Service: Cardiovascular;  Laterality: N/A;   Social History:  reports that she has quit smoking. Her smoking use included cigarettes. She has never used smokeless tobacco. She reports that she does not drink alcohol and does not use drugs. Family History:  Family History  Problem Relation Age of Onset   Depression  Father    Breast cancer Maternal Grandmother    Depression Brother    Anxiety disorder Brother    ADD / ADHD Brother    Alcohol abuse Brother    Alcohol abuse Maternal Grandfather    Diabetes Neg Hx      HOME MEDICATIONS: Allergies as of 02/25/2023       Reactions   Sulfamethoxazole-trimethoprim Rash        Medication List        Accurate as of February 25, 2023  6:54 AM. If you have any questions, ask your nurse or doctor.          Acetaminophen Extra Strength 500 MG Tabs Take 500 mg by mouth every 4 (four) hours as needed for mild pain or headache.   acetone (urine) test strip 1 strip by Does  not apply route as needed for high blood sugar.   aspirin 81 MG chewable tablet Chew 1 tablet (81 mg total) by mouth daily.   atorvastatin 80 MG tablet Commonly known as: LIPITOR Take 1 tablet (80 mg total) by mouth daily.   buPROPion 150 MG 24 hr tablet Commonly known as: WELLBUTRIN XL Take 150 mg by mouth every morning.   Dexcom G6 Sensor Misc Change sensor every 10 days   Dexcom G6 Transmitter Misc USE 1 DEVICE BY OTHER ROUTE AS DIRECTED   doxycycline 50 MG capsule Commonly known as: MONODOX Take 50 mg by mouth daily.   elvitegravir-cobicistat-emtricitabine-tenofovir 150-150-200-10 MG Tabs tablet Commonly known as: GENVOYA Take 1 tablet by mouth daily with breakfast.   Genvoya 150-150-200-10 MG Tabs tablet Generic drug: elvitegravir-cobicistat-emtricitabine-tenofovir Take 1 tablet by mouth once daily with breakfast   FLUoxetine 20 MG capsule Commonly known as: PROZAC Take 60 mg by mouth daily.   InPen 100-Pink-Novolog-Fiasp Devi Generic drug: injection device for insulin Use with NovoLog cartridge for insulin injections   insulin aspart cartridge Commonly known as: NOVOLOG Max daily 30 units   NovoLOG FlexPen 100 UNIT/ML FlexPen Generic drug: insulin aspart Max daily dose 30 units   lamoTRIgine 100 MG tablet Commonly known as: LaMICtal Take 1 tablet (100 mg total) by mouth at bedtime. Take one tablet at bedtime.   metoprolol succinate 50 MG 24 hr tablet Commonly known as: TOPROL-XL TAKE 1 TABLET(50 MG) BY MOUTH DAILY   nitroGLYCERIN 0.4 MG SL tablet Commonly known as: NITROSTAT DISSOLVE ONE TABLET UNDER TONGUE AS NEEDED FOR CHEST PAIN EVERY 5 MINUTES   Novofine Pen Needle 32G X 6 MM Misc Generic drug: Insulin Pen Needle 1 Device by Other route in the morning, at noon, in the evening, and at bedtime.   Semaglutide(0.25 or 0.5MG /DOS) 2 MG/3ML Sopn Inject 0.5 mg into the skin once a week.   Alicia Phillips FlexTouch 100 UNIT/ML FlexTouch Pen Generic drug:  insulin degludec Inject 30 Units into the skin daily.         OBJECTIVE:   Vital Signs: There were no vitals taken for this visit.  Wt Readings from Last 3 Encounters:  05/23/22 199 lb 12.8 oz (90.6 kg)  12/11/21 198 lb (89.8 kg)  08/28/21 197 lb 3.2 oz (89.4 kg)     Exam: General: Pt appears well and is in NAD  Neck: General: Supple without adenopathy. Thyroid: Thyroid size normal.  No goiter or nodules appreciated.   Lungs: Clear with good BS bilat   Heart: RRR   Abdomen:  soft, nontender  Extremities: No pretibial edema.   Neuro: MS is good with appropriate affect, pt is  alert and Ox3    DM foot exam:05/23/2022   The skin of the feet is intact without sores or ulcerations. The pedal pulses are 2+ on right and 2+ on left. The sensation is intact to a screening 5.07, 10 gram monofilament bilaterally        DATA REVIEWED:  Lab Results  Component Value Date   HGBA1C 9.7 (A) 05/23/2022   HGBA1C 9.4 (A) 08/28/2021   HGBA1C 8.8 (A) 05/22/2021    Latest Reference Range & Units 01/15/22 01:55  Sodium 135 - 145 mmol/L 134 (L)  Potassium 3.5 - 5.1 mmol/L 4.6  Chloride 98 - 111 mmol/L 105  CO2 22 - 32 mmol/L 18 (L)  Glucose 70 - 99 mg/dL 425 (H)  BUN 6 - 20 mg/dL 12  Creatinine 9.56 - 3.87 mg/dL 5.64  Calcium 8.9 - 33.2 mg/dL 8.9  Anion gap 5 - 15  11  Alkaline Phosphatase 38 - 126 U/L 112  Albumin 3.5 - 5.0 g/dL 3.7  AST 15 - 41 U/L 32  ALT 0 - 44 U/L 33  Total Protein 6.5 - 8.1 g/dL 7.6  Total Bilirubin 0.3 - 1.2 mg/dL 0.5  GFR, Estimated >95 mL/min >60    Old records , labs and images have been reviewed.   ASSESSMENT / PLAN / RECOMMENDATIONS:   1) Type 2 Diabetes Mellitus, Poorly controlled, With retinopathic and macrovascular  complications - Most recent A1c of 9.7 %. Goal A1c < 7.0 %.    -We also discussed that if she is unable to get the Ozempic, will consider metformin -Of note the patient does have diabetic retinopathy but it is stable, I have  cautioned her against changes in vision with Ozempic, will monitor   MEDICATIONS: Increase Tresiba 30 units daily NovoLog I:C ratio 1:10 Sensitivity factor 45 Start Ozempic 0.25 mg weekly, after 6 weeks increase to 0.5 mg weekly  EDUCATION / INSTRUCTIONS: BG monitoring instructions: Patient is instructed to check her blood sugars 3 times a day, before meals. Call  Endocrinology clinic if: BG persistently < 70  I reviewed the Rule of 15 for the treatment of hypoglycemia in detail with the patient. Literature supplied.    2) Diabetic complications:  Eye: Does  have known diabetic retinopathy.  Neuro/ Feet: Does not have known diabetic peripheral neuropathy .  Renal: Patient does not have known baseline CKD. She   is not on an ACEI/ARB at present.     F/U in 4 months     Signed electronically by: Lyndle Herrlich, MD  [facility] Endocrinology  [facility] [address] [place], [place] [address] Phone: [phone] FAX: [phone]   CC: Aliene Beams, MD [phone] [address] [place] [address] Phone: [phone]  Fax: [phone]  Return to Endocrinology clinic as below: Future Appointments  Date Time Provider Department Center  02/25/2023  7:30 AM , Konrad Dolores, MD [facility] None  02/27/2023  8:50 AM Jodelle Gross, NP [facility] None

## 2023-02-26 ENCOUNTER — Emergency Department (HOSPITAL_COMMUNITY): Payer: BC Managed Care – PPO

## 2023-02-26 ENCOUNTER — Emergency Department (HOSPITAL_COMMUNITY)
Admission: EM | Admit: 2023-02-26 | Discharge: 2023-02-27 | Disposition: A | Discharge status: Home / Self Care | Payer: BC Managed Care – PPO | Attending: Emergency Medicine | Admitting: Emergency Medicine

## 2023-02-26 ENCOUNTER — Encounter (HOSPITAL_COMMUNITY): Payer: Self-pay

## 2023-02-26 DIAGNOSIS — Z794 Long term (current) use of insulin: Secondary | ICD-10-CM | POA: Insufficient documentation

## 2023-02-26 DIAGNOSIS — E109 Type 1 diabetes mellitus without complications: Secondary | ICD-10-CM | POA: Insufficient documentation

## 2023-02-26 DIAGNOSIS — Z87891 Personal history of nicotine dependence: Secondary | ICD-10-CM | POA: Diagnosis not present

## 2023-02-26 DIAGNOSIS — Z7982 Long term (current) use of aspirin: Secondary | ICD-10-CM | POA: Insufficient documentation

## 2023-02-26 DIAGNOSIS — R002 Palpitations: Secondary | ICD-10-CM | POA: Diagnosis present

## 2023-02-26 LAB — CBC
HCT: 39.4 % (ref 36.0–46.0)
Hemoglobin: 13 g/dL (ref 12.0–15.0)
MCH: 30.2 pg (ref 26.0–34.0)
MCHC: 33 g/dL (ref 30.0–36.0)
MCV: 91.6 fL (ref 80.0–100.0)
Platelets: 325 10*3/uL (ref 150–400)
RBC: 4.3 MIL/uL (ref 3.87–5.11)
RDW: 12.5 % (ref 11.5–15.5)
WBC: 8.7 10*3/uL (ref 4.0–10.5)
nRBC: 0 % (ref 0.0–0.2)

## 2023-02-26 LAB — URINALYSIS, ROUTINE W REFLEX MICROSCOPIC
Bilirubin Urine: NEGATIVE
Glucose, UA: 50 mg/dL — AB
Ketones, ur: NEGATIVE mg/dL
Leukocytes,Ua: NEGATIVE
Nitrite: NEGATIVE
Protein, ur: NEGATIVE mg/dL
Specific Gravity, Urine: 1.008 (ref 1.005–1.030)
pH: 5 (ref 5.0–8.0)

## 2023-02-26 LAB — BASIC METABOLIC PANEL
Anion gap: 12 (ref 5–15)
BUN: 9 mg/dL (ref 6–20)
CO2: 21 mmol/L — ABNORMAL LOW (ref 22–32)
Calcium: 9.2 mg/dL (ref 8.9–10.3)
Chloride: 101 mmol/L (ref 98–111)
Creatinine, Ser: 0.65 mg/dL (ref 0.44–1.00)
GFR, Estimated: >60 mL/min (ref >60)
Glucose, Bld: 246 mg/dL — ABNORMAL HIGH (ref 70–99)
Potassium: 3.9 mmol/L (ref 3.5–5.1)
Sodium: 134 mmol/L — ABNORMAL LOW (ref 135–145)

## 2023-02-26 LAB — TROPONIN I (HIGH SENSITIVITY): Troponin I (High Sensitivity): 3 ng/L (ref <18)

## 2023-02-26 NOTE — ED Triage Notes (Signed)
Pt is coming in for palpitations that statred 1hr ago, has a history of palpitations and MI which occurred in 2021, medic reports the " double heart beat " as PVCs on monitor. Pt has had some SHOB but its accompanied with anxiety. No meds given prior to arrival. Pt mentions today she has had two stressful events. She also drank half of an energy drink earlier in the day, she also smoked a cigarette before the palpitations as well as two beers.   Medic vitals   104hr 152/90 100%ra 196bgl  22rr

## 2023-02-26 NOTE — ED Notes (Signed)
Attempted to obtain labs x1 with no success.

## 2023-02-27 ENCOUNTER — Ambulatory Visit: Payer: BC Managed Care – PPO | Attending: Adult Health

## 2023-02-27 ENCOUNTER — Ambulatory Visit: Payer: BC Managed Care – PPO | Attending: Adult Health | Admitting: Adult Health

## 2023-02-27 ENCOUNTER — Encounter: Payer: Self-pay | Admitting: Adult Health

## 2023-02-27 VITALS — BP 118/76 | HR 84 | Ht 68.0 in | Wt 203.2 lb

## 2023-02-27 DIAGNOSIS — R002 Palpitations: Secondary | ICD-10-CM

## 2023-02-27 DIAGNOSIS — E7800 Pure hypercholesterolemia, unspecified: Secondary | ICD-10-CM

## 2023-02-27 DIAGNOSIS — I2510 Atherosclerotic heart disease of native coronary artery without angina pectoris: Secondary | ICD-10-CM | POA: Diagnosis not present

## 2023-02-27 NOTE — ED Provider Notes (Signed)
[place] EMERGENCY DEPARTMENT AT [facility] Provider Note   CSN: [id number] Arrival date & time: 02/26/23  2216     History  Chief Complaint  Patient presents with   Palpitations    Alicia Phillips is a 42 y.o. female.  Patient with past medical history significant for coronary stent, type I DM, tobacco abuse, anxiety, bipolar disorder presents to the emergency department via EMS complaining of palpitations.  Patient states that starting at 9 PM she began to feel "PVCs".  She states that she has been feeling increased irregular heartbeats over the past month and has an appointment later today with cardiology for follow-up.  During the day she had to "stressful events", smoked a cigarette, had half an energy drink, and had 2 beers prior to the onset of the run of palpitations.  EMS reported PVCs on their cardiac monitor.  The patient does endorse occasional shortness of breath but states it is accompanied with her anxiety.  Currently the patient feels that the PVCs have calmed down.  She endorses occasional irregular heartbeat.  She denies chest pain, shortness of breath, abdominal pain, nausea, vomiting, radiation of symptoms.  HPI     Home Medications Prior to Admission medications   Medication Sig Start Date End Date Taking? Authorizing Provider  ACETAMINOPHEN EXTRA STRENGTH 500 MG tablet Take 500 mg by mouth every 4 (four) hours as needed for mild pain or headache.  05/06/19   [provider]  acetone, urine, test strip 1 strip by Does not apply route as needed for high blood sugar. 06/18/21   Romero Belling, MD  aspirin 81 MG chewable tablet Chew 1 tablet (81 mg total) by mouth daily. 04/06/20   Marjie Skiff E, PA-C  atorvastatin (LIPITOR) 80 MG tablet Take 1 tablet (80 mg total) by mouth daily. 05/08/22   Runell Gess, MD  buPROPion (WELLBUTRIN XL) 150 MG 24 hr tablet Take 150 mg by mouth every morning. 05/02/22   [provider]  Continuous Blood  Gluc Sensor (DEXCOM G6 SENSOR) MISC Change sensor every 10 days 06/07/22   Shamleffer, Konrad Dolores, MD  Continuous Glucose Transmitter (DEXCOM G6 TRANSMITTER) MISC USE 1 DEVICE BY OTHER ROUTE AS DIRECTED 12/22/22   Shamleffer, Konrad Dolores, MD  doxycycline (MONODOX) 50 MG capsule Take 50 mg by mouth daily. 04/03/22   [provider]  elvitegravir-cobicistat-emtricitabine-tenofovir (GENVOYA) 150-150-200-10 MG TABS tablet Take 1 tablet by mouth daily with breakfast. 01/15/22   Roxy Horseman, PA-C  elvitegravir-cobicistat-emtricitabine-tenofovir (GENVOYA) 150-150-200-10 MG TABS tablet Take 1 tablet by mouth once daily with breakfast 01/15/22   Roxy Horseman, PA-C  FLUoxetine (PROZAC) 20 MG capsule Take 60 mg by mouth daily. 08/10/20   [provider]  injection device for insulin (INPEN 100-PINK-NOVOLOG-FIASP) DEVI Use with NovoLog cartridge for insulin injections 02/08/22   Reather Littler, MD  insulin aspart (NOVOLOG FLEXPEN) 100 UNIT/ML FlexPen Max daily dose 30 units 02/12/23   Reather Littler, MD  insulin aspart (NOVOLOG) cartridge Max daily 30 units 05/24/22   Shamleffer, Konrad Dolores, MD  lamoTRIgine (LAMICTAL) 100 MG tablet Take 1 tablet (100 mg total) by mouth at bedtime. Take one tablet at bedtime. 05/02/20   [name], NP  metoprolol succinate (TOPROL-XL) 50 MG 24 hr tablet TAKE 1 TABLET(50 MG) BY MOUTH DAILY 02/25/23   Runell Gess, MD  nitroGLYCERIN (NITROSTAT) 0.4 MG SL tablet DISSOLVE ONE TABLET UNDER TONGUE AS NEEDED FOR CHEST PAIN EVERY 5 MINUTES 12/27/21   Runell Gess,  MD  NOVOFINE PEN NEEDLE 32G X 6 MM MISC 1 Device by Other route in the morning, at noon, in the evening, and at bedtime. 05/24/22   Shamleffer, Konrad Dolores, MD  Semaglutide,0.25 or 0.5MG /DOS, 2 MG/3ML SOPN Inject 0.5 mg into the skin once a week. 05/23/22   Shamleffer, Konrad Dolores, MD  TRESIBA FLEXTOUCH 100 UNIT/ML FlexTouch Pen Inject 30 Units into the skin daily. 05/24/22    Shamleffer, Konrad Dolores, MD      Allergies    Sulfamethoxazole-trimethoprim    Review of Systems   Review of Systems  Physical Exam Updated Vital Signs BP 112/79   Pulse 86   Temp 98.5 F (36.9 C) (Oral)   Resp 16   SpO2 97%  Physical Exam Vitals and nursing note reviewed.  Constitutional:      General: She is not in acute distress.    Appearance: She is well-developed.  HENT:     Head: Normocephalic and atraumatic.  Eyes:     Conjunctiva/sclera: Conjunctivae normal.  Cardiovascular:     Rate and Rhythm: Normal rate and regular rhythm.     Heart sounds: No murmur heard. Pulmonary:     Effort: Pulmonary effort is normal. No respiratory distress.     Breath sounds: Normal breath sounds.  Abdominal:     Palpations: Abdomen is soft.     Tenderness: There is no abdominal tenderness.  Musculoskeletal:        General: No swelling.     Cervical back: Neck supple.  Skin:    General: Skin is warm and dry.     Capillary Refill: Capillary refill takes less than 2 seconds.  Neurological:     Mental Status: She is alert.  Psychiatric:        Mood and Affect: Mood normal.     ED Results / Procedures / Treatments   Labs (all labs ordered are listed, but only abnormal results are displayed) Labs Reviewed  BASIC METABOLIC PANEL - Abnormal; Notable for the following components:      Result Value   Sodium 134 (*)    CO2 21 (*)    Glucose, Bld 246 (*)    All other components within normal limits  URINALYSIS, ROUTINE W REFLEX MICROSCOPIC - Abnormal; Notable for the following components:   Glucose, UA 50 (*)    Hgb urine dipstick SMALL (*)    Bacteria, UA FEW (*)    All other components within normal limits  CBC  TROPONIN I (HIGH SENSITIVITY)  TROPONIN I (HIGH SENSITIVITY)    EKG None  Radiology DG Chest Port 1 View  Result Date: 02/26/2023 CLINICAL DATA:  Atrial fibrillation. EXAM: PORTABLE CHEST 1 VIEW COMPARISON:  Chest radiograph dated 09/27/2022. FINDINGS:  The heart size and mediastinal contours are within normal limits. Both lungs are clear. The visualized skeletal structures are unremarkable. IMPRESSION: No active disease. Electronically Signed   By: Elgie Collard M.D.   On: 02/26/2023 23:17    Procedures Procedures    Medications Ordered in ED Medications - No data to display  ED Course/ Medical Decision Making/ A&P                                 Medical Decision Making Amount and/or Complexity of Data Reviewed Labs: ordered. Radiology: ordered.   This patient presents to the ED for concern of palpitations, this involves an extensive number of treatment options, and is a  complaint that carries with it a high risk of complications and morbidity.  The differential diagnosis includes dysrhythmia, ACS, others   Co morbidities that complicate the patient evaluation  History of MI with subsequent stent, anxiety, tobacco use   Additional history obtained:  Additional history obtained from EMS External records from outside source obtained and reviewed including cardiology notes   Lab Tests:  I Ordered, and personally interpreted labs.  The pertinent results include: Grossly unremarkable BMP, CBC.  UA with small hemoglobin, glucose, few bacteria.  Initial troponin 3, repeat less than 2   Imaging Studies ordered:  I ordered imaging studies including chest x-ray I independently visualized and interpreted imaging which showed no active disease I agree with the radiologist interpretation   Cardiac Monitoring: / EKG:  The patient was maintained on a cardiac monitor.  I personally viewed and interpreted the cardiac monitored which showed an underlying rhythm of: Normal sinus rhythm   Test / Admission - Considered:  Patient with negative troponins x 2, nonischemic EKG.  No frank chest pain at this time.  Very low clinical suspicion of ACS.  Patient with no tachycardia to suggest pulmonary embolism, no shortness of breath.   Patient had reported PVCs with EMS but none noted on the monitor while in the emergency department or noted on the ED twelve lead.  Question if patient stress combined with energy drink may have triggered some palpitations.  Patient has cardiology appointment earlier this morning.  Plan to discharge at this time with plans for patient to keep that appointment for further evaluation as needed         Final Clinical Impression(s) / ED Diagnoses Final diagnoses:  Palpitations    Rx / DC Orders ED Discharge Orders     None         Pamala Duffel 02/27/23 0355    Nira Conn, MD 02/28/23 [phone]

## 2023-02-27 NOTE — Progress Notes (Unsigned)
Enrolled for [employer] to mail a ZIO XT long term holter monitor to the patients address on file.   Dr. Berry to read. 

## 2023-02-27 NOTE — Discharge Instructions (Signed)
You were evaluated today for palpitations.  Your workup was reassuring.  Please keep your upcoming cardiology appointment this morning.  If you develop any life-threatening system such as chest pain or shortness of breath please return to the emergency department.

## 2023-02-27 NOTE — Patient Instructions (Signed)
Medication Instructions:  No Changes *If you need a refill on your cardiac medications before your next appointment, please call your pharmacy*   Lab Work: No Labs If you have labs (blood work) drawn today and your tests are completely normal, you will receive your results only by: MyChart Message (if you have MyChart) OR A paper copy in the mail If you have any lab test that is abnormal or we need to change your treatment, we will call you to review the results.   Testing/Procedures: Alicia Phillips- Long Term Monitor Instructions  Your physician has requested you wear a ZIO patch monitor for 14 days.  This is a single patch monitor. [employer] supplies one patch monitor per enrollment. Additional stickers are not available. Please do not apply patch if you will be having a Nuclear Stress Test,  Echocardiogram, Cardiac CT, MRI, or Chest Xray during the period you would be wearing the  monitor. The patch cannot be worn during these tests. You cannot remove and re-apply the  ZIO XT patch monitor.  Your ZIO patch monitor will be mailed 3 day USPS to your address on file. It may take 3-5 days  to receive your monitor after you have been enrolled.  Once you have received your monitor, please review the enclosed instructions. Your monitor  has already been registered assigning a specific monitor serial # to you.  Billing and Patient Assistance Program Information  We have supplied [employer] with any of your insurance information on file for billing purposes. [employer] offers a sliding scale Patient Assistance Program for patients that do not have  insurance, or whose insurance does not completely cover the cost of the ZIO monitor.  You must apply for the Patient Assistance Program to qualify for this discounted rate.  To apply, please call [employer] at [phone], select option 4, select option 2, ask to apply for  Patient Assistance Program. Alicia Phillips will ask your household income, and how many people   are in your household. They will quote your out-of-pocket cost based on that information.  [employer] will also be able to set up a 32-month, interest-free payment plan if needed.  Applying the monitor   Shave hair from upper left chest.  Hold abrader disc by orange tab. Rub abrader in 40 strokes over the upper left chest as  indicated in your monitor instructions.  Clean area with 4 enclosed alcohol pads. Let dry.  Apply patch as indicated in monitor instructions. Patch will be placed under collarbone on left  side of chest with arrow pointing upward.  Rub patch adhesive wings for 2 minutes. Remove white label marked "1". Remove the white  label marked "2". Rub patch adhesive wings for 2 additional minutes.  While looking in a mirror, press and release button in center of patch. A small green light will  flash 3-4 times. This will be your only indicator that the monitor has been turned on.  Do not shower for the first 24 hours. You may shower after the first 24 hours.  Press the button if you feel a symptom. You will hear a small click. Record Date, Time and  Symptom in the Patient Logbook.  When you are ready to remove the patch, follow instructions on the last 2 pages of Patient  Logbook. Stick patch monitor onto the last page of Patient Logbook.  Place Patient Logbook in the blue and white box. Use locking tab on box and tape box closed  securely. The blue and white box  has prepaid postage on it. Please place it in the mailbox as  soon as possible. Your physician should have your test results approximately 7 days after the  monitor has been mailed back to [facility].  Call [facility] Customer Care at [phone] if you have questions regarding  your ZIO XT patch monitor. Call them immediately if you see an orange light blinking on your  monitor.  If your monitor falls off in less than 4 days, contact our Monitor department at [phone].  If your monitor becomes loose or  falls off after 4 days call [employer] at [phone] for  suggestions on securing your monitor    Follow-Up: At [facility], you and your health needs are our priority.  As part of our continuing mission to provide you with exceptional heart care, we have created designated Provider Care Teams.  These Care Teams include your primary Cardiologist (physician) and Advanced Practice Providers (APPs -  Physician Assistants and Nurse Practitioners) who all work together to provide you with the care you need, when you need it.  We recommend signing up for the patient portal called "MyChart".  Sign up information is provided on this After Visit Summary.  MyChart is used to connect with patients for Virtual Visits (Telemedicine).  Patients are able to view lab/test results, encounter notes, upcoming appointments, etc.  Non-urgent messages can be sent to your provider as well.   To learn more about what you can do with MyChart, go to [email or web address].    Your next appointment:   4-5week(s)  Provider:   Joni Reining, DNP, ANP

## 2023-03-03 DIAGNOSIS — R002 Palpitations: Secondary | ICD-10-CM | POA: Diagnosis not present

## 2023-03-06 ENCOUNTER — Ambulatory Visit: Payer: BC Managed Care – PPO | Admitting: Internal Medicine

## 2023-03-24 NOTE — Progress Notes (Unsigned)
Cardiology Clinic Note   Patient Name: Alicia Phillips Date of Encounter: 03/27/2023  Primary Care Provider:  Aliene Beams, MD Primary Cardiologist:  Nanetta Batty, MD  Patient Profile    42 year old female with a history of type 1 diabetes since 1986 tobacco abuse, CAD, HL for OSA, who presented on 05/05/2020 with an anterior non-STEMI.   She underwent diagnostic coronary angiography the same day by Dr. Kirke Corin found a 95% mid LAD lesion that was stented with a 2.25 mm x 18 mm long resolute Onyx drug-eluting stent.  She had no other significant CAD.  LV function was normal.   Past Medical History    Past Medical History:  Diagnosis Date   Anxiety    Bipolar disorder (HCC)    CAD (coronary artery disease)    a. LHC 04/05/20: 95% stenosis of mid LAD s/p DES, 30% stenosis of proximal to mid LAD   Diabetes mellitus type 1 (HCC)    History of borderline personality disorder    Hyperlipidemia    Major depression    STEMI (ST elevation myocardial infarction) (HCC) 04/05/2020   Tobacco use    Past Surgical History:  Procedure Laterality Date   CORONARY STENT INTERVENTION N/A 04/05/2020   Procedure: CORONARY STENT INTERVENTION;  Surgeon: Iran Ouch, MD;  Location: [facility];  Service: Cardiovascular;  Laterality: N/A;   LEFT HEART CATH AND CORONARY ANGIOGRAPHY N/A 04/05/2020   Procedure: LEFT HEART CATH AND CORONARY ANGIOGRAPHY;  Surgeon: Iran Ouch, MD;  Location: [facility];  Service: Cardiovascular;  Laterality: N/A;    Allergies  Allergies  Allergen Reactions   Sulfamethoxazole-Trimethoprim Rash    History of Present Illness    Alicia Phillips returns today for ongoing assessment management of coronary artery disease with stent placed to mid LAD, in 2021, hypertension, hyperlipidemia, with other history as described above.  She is currently on aspirin and not on dual antiplatelet therapy any longer.  She states that she is noticing that palpitations  have improved with decreasing use of caffeine.  She is also talking with her psychiatrist about changing her SSRI as this has also made palpitations worse.  She has an appointment with them coming up within the week.  She continues to be physically active and walks to work (1 mile 1 way) each day and does yoga.  She denies any chest pain presyncope or dyspnea.  Home Medications    Current Outpatient Medications  Medication Sig Dispense Refill   ACETAMINOPHEN EXTRA STRENGTH 500 MG tablet Take 500 mg by mouth every 4 (four) hours as needed for mild pain or headache.      acetone, urine, test strip 1 strip by Does not apply route as needed for high blood sugar. 25 each 0   aspirin 81 MG chewable tablet Chew 1 tablet (81 mg total) by mouth daily.     atorvastatin (LIPITOR) 80 MG tablet Take 1 tablet (80 mg total) by mouth daily. 90 tablet 2   buPROPion (WELLBUTRIN XL) 150 MG 24 hr tablet Take 150 mg by mouth every morning. Taking as needed     Continuous Blood Gluc Sensor (DEXCOM G6 SENSOR) MISC Change sensor every 10 days 9 each 3   Continuous Glucose Transmitter (DEXCOM G6 TRANSMITTER) MISC USE 1 DEVICE BY OTHER ROUTE AS DIRECTED 1 each 0   ezetimibe (ZETIA) 10 MG tablet Take 1 tablet (10 mg total) by mouth daily. 90 tablet 3   FLUoxetine (PROZAC) 20 MG  capsule Take 60 mg by mouth daily.     injection device for insulin (INPEN 100-PINK-NOVOLOG-FIASP) DEVI Use with NovoLog cartridge for insulin injections 1 each 2   insulin aspart (NOVOLOG FLEXPEN) 100 UNIT/ML FlexPen Max daily dose 30 units 3 mL 0   insulin aspart (NOVOLOG) cartridge Max daily 30 units 30 mL 3   lamoTRIgine (LAMICTAL) 100 MG tablet Take 1 tablet (100 mg total) by mouth at bedtime. Take one tablet at bedtime. 30 tablet 1   metoprolol succinate (TOPROL-XL) 50 MG 24 hr tablet TAKE 1 TABLET(50 MG) BY MOUTH DAILY 15 tablet 0   metoprolol tartrate (LOPRESSOR) 25 MG tablet Take 0.5 tablets (12.5 mg total) by mouth as needed (Use for  fast heart rate longer than 15 minutes). 45 tablet 2   nitroGLYCERIN (NITROSTAT) 0.4 MG SL tablet DISSOLVE ONE TABLET UNDER TONGUE AS NEEDED FOR CHEST PAIN EVERY 5 MINUTES 25 tablet 2   NOVOFINE PEN NEEDLE 32G X 6 MM MISC 1 Device by Other route in the morning, at noon, in the evening, and at bedtime. 400 each 3   Semaglutide,0.25 or 0.5MG /DOS, 2 MG/3ML SOPN Inject 0.5 mg into the skin once a week. 3 mL 11   TRESIBA FLEXTOUCH 100 UNIT/ML FlexTouch Pen Inject 30 Units into the skin daily. 30 mL 3   No current facility-administered medications for this visit.     Family History    Family History  Problem Relation Age of Onset   Depression Father    Breast cancer Maternal Grandmother    Depression Brother    Anxiety disorder Brother    ADD / ADHD Brother    Alcohol abuse Brother    Alcohol abuse Maternal Grandfather    Diabetes Neg Hx    She indicated that her mother is alive. She indicated that her father is alive. She indicated that the status of her brother is unknown. She indicated that her maternal grandmother is deceased. She indicated that the status of her maternal grandfather is unknown. She indicated that the status of her neg hx is unknown.  Social History    Social History   Socioeconomic History   Marital status: Single    Spouse name: Not on file   Number of children: Not on file   Years of education: Not on file   Highest education level: Not on file  Occupational History   Not on file  Tobacco Use   Smoking status: Former    Types: Cigarettes   Smokeless tobacco: Never   Tobacco comments:    patient stated she was smoking but has quit since 05/30/20  Vaping Use   Vaping status: Never Used  Substance and Sexual Activity   Alcohol use: Never   Drug use: Never   Sexual activity: Not on file  Other Topics Concern   Not on file  Social History Narrative   Not on file   Social Determinants of Health   Financial Resource Strain: Not on file  Food  Insecurity: Not on file  Transportation Needs: Not on file  Physical Activity: Not on file  Stress: Not on file  Social Connections: Not on file  Intimate Partner Violence: Not on file     Review of Systems    General:  No chills, fever, night sweats or weight changes.  Cardiovascular:  No chest pain, dyspnea on exertion, edema, orthopnea, palpitations, paroxysmal nocturnal dyspnea. Dermatological: No rash, lesions/masses Respiratory: No cough, dyspnea Urologic: No hematuria, dysuria Abdominal:   No nausea,  vomiting, diarrhea, bright red blood per rectum, melena, or hematemesis Neurologic:  No visual changes, wkns, changes in mental status. All other systems reviewed and are otherwise negative except as noted above.       Physical Exam    VS:  BP 122/84   Pulse 68   Ht 5\' 8"  (1.727 m)   Wt 197 lb 3.2 oz (89.4 kg)   LMP 02/21/2023   SpO2 95%   BMI 29.98 kg/m  , BMI Body mass index is 29.98 kg/m.     GEN: Well nourished, well developed, in no acute distress. HEENT: normal. Neck: Supple, no JVD, carotid bruits, or masses. Cardiac: IRRR, no murmurs, rubs, or gallops. No clubbing, cyanosis, edema.  Radials/DP/PT 2+ and equal bilaterally.  Respiratory:  Respirations regular and unlabored, clear to auscultation bilaterally. GI: Soft, nontender, nondistended, BS + x 4. MS: no deformity or atrophy. Skin: warm and dry, no rash. Neuro:  Strength and sensation are intact. Psych: Normal affect.      Lab Results  Component Value Date   WBC 8.7 02/26/2023   HGB 13.0 02/26/2023   HCT 39.4 02/26/2023   MCV 91.6 02/26/2023   PLT 325 02/26/2023   Lab Results  Component Value Date   CREATININE 0.65 02/26/2023   BUN 9 02/26/2023   NA 134 (L) 02/26/2023   K 3.9 02/26/2023   CL 101 02/26/2023   CO2 21 (L) 02/26/2023   Lab Results  Component Value Date   ALT 33 01/15/2022   AST 32 01/15/2022   ALKPHOS 112 01/15/2022   BILITOT 0.5 01/15/2022   Lab Results  Component  Value Date   CHOL 123 08/09/2020   HDL 53 08/09/2020   LDLCALC 53 08/09/2020   TRIG 87 08/09/2020   CHOLHDL 2.3 08/09/2020    Lab Results  Component Value Date   HGBA1C 9.7 (A) 05/23/2022     Review of Prior Studies    Echocardiogram 04/06/2020  1. Left ventricular ejection fraction, by estimation, is 60 to 65%. The  left ventricle has normal function. The left ventricle has no regional  wall motion abnormalities. Left ventricular diastolic parameters were  normal.   2. Right ventricular systolic function is normal. The right ventricular  size is normal. Tricuspid regurgitation signal is inadequate for assessing  PA pressure.   3. The mitral valve is normal in structure. No evidence of mitral valve  regurgitation. No evidence of mitral stenosis.   4. The aortic valve is tricuspid. Aortic valve regurgitation is not  visualized. No aortic stenosis is present.   5. The inferior vena cava is normal in size with greater than 50%  respiratory variability, suggesting right atrial pressure of 3 mmHg.      LHC 04/05/2020 Dr.Arida    There is mild left ventricular systolic dysfunction. LV end diastolic pressure is normal. Prox LAD to Mid LAD lesion is 30% stenosed. Mid LAD lesion is 95% stenosed. Post intervention, there is a 0% residual stenosis. A drug-eluting stent was successfully placed using a STENT RESOLUTE ONYX 2.25X18.   1.  Severe one-vessel coronary artery disease with 95% stenosis in the mid LAD just before the origin of the diagonal with evidence of thrombotic plaque rupture.  No other obstructive disease. 2.  Mildly reduced LV systolic function with distal and apical wall hypokinesis.  Normal LVEDP. 3.  Successful angioplasty and drug-eluting stent placement to the mid LAD.   Recommendations: Dual antiplatelet therapy for at least 1 year.    Assessment &  Plan   1.  Coronary artery disease: She is status post NSTEMI in 2021, status post DES to the mid LAD which was 95%  stenosed.  She is currently on aspirin and metoprolol.  She offers no cardiac complaints today with the exception of an irregular heart rhythm.  Continue secondary prevention with blood pressure control, lipid control, purposeful exercise, and weight management.  2.  Palpitations: I have reviewed her ZIO monitor with her today.  She was found to have minimum heart rate of 77 bpm max 134 with an average of 94 bpm with prolonged running the underlying rhythm was sinus rhythm.  She had a few isolated sinus ventricular ectopy less than 1% and triplets were present with isolated ventricular ectopy which was rare less than 1%.  She is given prescription for metoprolol to tartrate 12.5 mg to take as needed for sustained irregular heart rhythm greater than 15 minutes.  3.  Hypercholesterolemia: She remains on statin therapy with goal of LDL less than 70.  Most recent labs revealed an LDL of 78.  Will plan on adding Zetia 10 mg to her medication regimen daily.  I have informed her that she may have some transient diarrhea when she first starts this.  If this becomes persistent, or causing abdominal discomfort she is to stop and report this to [place].  Going to repeat fasting lipids and LFTs in 6 weeks and see her in 2 months.  4.  Bipolar disorder: Being followed by psych Kia tree with adjustments of medications which are contributing to palpitations.  Due to see them within the next week for more recommendations.         Signed, Bettey Mare. Liborio Nixon, ANP, AACC   03/27/2023 12:02 PM      Office [phone] Fax [phone]  Notice: This dictation was prepared with Dragon dictation along with smaller phrase technology. Any transcriptional errors that result from this process are unintentional and may not be corrected upon review.

## 2023-03-27 ENCOUNTER — Encounter: Payer: Self-pay | Admitting: Adult Health

## 2023-03-27 ENCOUNTER — Ambulatory Visit: Payer: BC Managed Care – PPO | Attending: Adult Health | Admitting: Adult Health

## 2023-03-27 ENCOUNTER — Other Ambulatory Visit (HOSPITAL_COMMUNITY): Payer: Self-pay

## 2023-03-27 VITALS — BP 122/84 | HR 68 | Ht 68.0 in | Wt 197.2 lb

## 2023-03-27 DIAGNOSIS — E7800 Pure hypercholesterolemia, unspecified: Secondary | ICD-10-CM

## 2023-03-27 DIAGNOSIS — I2510 Atherosclerotic heart disease of native coronary artery without angina pectoris: Secondary | ICD-10-CM | POA: Diagnosis not present

## 2023-03-27 DIAGNOSIS — R002 Palpitations: Secondary | ICD-10-CM

## 2023-03-27 MED ORDER — METOPROLOL TARTRATE 25 MG PO TABS
12.5000 mg | ORAL_TABLET | ORAL | 2 refills | Status: AC | PRN
  Filled 2023-03-27: qty 45, 90d supply, fill #0

## 2023-03-27 MED ORDER — EZETIMIBE 10 MG PO TABS
10.0000 mg | ORAL_TABLET | Freq: Every day | ORAL | 3 refills | Status: DC
  Filled 2023-03-27: qty 90, 90d supply, fill #0

## 2023-03-27 NOTE — Patient Instructions (Signed)
Medication Instructions:  Start Metoprolol Tartrate 12.5 mg as needed for fast heart rate. Start Zetia 10 mg once a day. *If you need a refill on your cardiac medications before your next appointment, please call your pharmacy*   Lab Work: Fasting LFT and Lipid panel in 6 weeks  If you have labs (blood work) drawn today and your tests are completely normal, you will receive your results only by: MyChart Message (if you have MyChart) OR A paper copy in the mail If you have any lab test that is abnormal or we need to change your treatment, we will call you to review the results.   Testing/Procedures: None at the time of the appointment.   Follow-Up: At [facility], you and your health needs are our priority.  As part of our continuing mission to provide you with exceptional heart care, we have created designated Provider Care Teams.  These Care Teams include your primary Cardiologist (physician) and Advanced Practice Providers (APPs -  Physician Assistants and Nurse Practitioners) who all work together to provide you with the care you need, when you need it.  We recommend signing up for the patient portal called "MyChart".  Sign up information is provided on this After Visit Summary.  MyChart is used to connect with patients for Virtual Visits (Telemedicine).  Patients are able to view lab/test results, encounter notes, upcoming appointments, etc.  Non-urgent messages can be sent to your provider as well.   To learn more about what you can do with MyChart, go to [email or web address].    Your next appointment:   2 month(s)  Provider:   Joni Reining, DNP, ANP

## 2023-03-28 ENCOUNTER — Telehealth: Payer: Self-pay

## 2023-03-28 NOTE — Telephone Encounter (Addendum)
Report discussed with patient at visit. Report mailed to patient .----- Message from Joni Reining sent at 03/27/2023  4:33 PM EDT ----- Discussed with patient during today's appointment and sent her the print out by mail.   KL

## 2023-04-10 ENCOUNTER — Other Ambulatory Visit (HOSPITAL_COMMUNITY): Payer: Self-pay

## 2023-04-11 ENCOUNTER — Other Ambulatory Visit: Payer: Self-pay | Admitting: Cardiovascular Disease

## 2023-04-11 DIAGNOSIS — R002 Palpitations: Secondary | ICD-10-CM

## 2023-04-22 ENCOUNTER — Other Ambulatory Visit (HOSPITAL_COMMUNITY): Payer: Self-pay

## 2023-05-06 ENCOUNTER — Other Ambulatory Visit: Payer: Self-pay | Admitting: Cardiovascular Disease

## 2023-05-06 DIAGNOSIS — R002 Palpitations: Secondary | ICD-10-CM

## 2023-05-08 ENCOUNTER — Other Ambulatory Visit: Payer: Self-pay | Admitting: Cardiovascular Disease

## 2023-05-08 DIAGNOSIS — R002 Palpitations: Secondary | ICD-10-CM

## 2023-05-08 NOTE — Progress Notes (Deleted)
Cardiology Clinic Note   Patient Name: Alicia Phillips Date of Encounter: 05/08/2023  Primary Care Provider:  Aliene Beams, MD Primary Cardiologist:  Nanetta Batty, MD  Patient Profile    42 year old female with a history of type 1 diabetes since 1986 tobacco abuse, CAD, HL for OSA, who presented on 05/05/2020 with an anterior non-STEMI.   She underwent diagnostic coronary angiography the same day by Dr. Kirke Corin found a 95% mid LAD lesion that was stented with a 2.25 mm x 18 mm long resolute Onyx drug-eluting stent.  She had no other significant CAD.  LV function was normal.   Past Medical History    Past Medical History:  Diagnosis Date   Anxiety    Bipolar disorder (HCC)    CAD (coronary artery disease)    a. LHC 04/05/20: 95% stenosis of mid LAD s/p DES, 30% stenosis of proximal to mid LAD   Diabetes mellitus type 1 (HCC)    History of borderline personality disorder    Hyperlipidemia    Major depression    STEMI (ST elevation myocardial infarction) (HCC) 04/05/2020   Tobacco use    Past Surgical History:  Procedure Laterality Date   CORONARY STENT INTERVENTION N/A 04/05/2020   Procedure: CORONARY STENT INTERVENTION;  Surgeon: Iran Ouch, MD;  Location: [facility];  Service: Cardiovascular;  Laterality: N/A;   LEFT HEART CATH AND CORONARY ANGIOGRAPHY N/A 04/05/2020   Procedure: LEFT HEART CATH AND CORONARY ANGIOGRAPHY;  Surgeon: Iran Ouch, MD;  Location: [facility];  Service: Cardiovascular;  Laterality: N/A;    Allergies  Allergies  Allergen Reactions   Sulfamethoxazole-Trimethoprim Rash    History of Present Illness    ***  Home Medications    Current Outpatient Medications  Medication Sig Dispense Refill   ACETAMINOPHEN EXTRA STRENGTH 500 MG tablet Take 500 mg by mouth every 4 (four) hours as needed for mild pain or headache.      acetone, urine, test strip 1 strip by Does not apply route as needed for high blood sugar. 25 each 0    aspirin 81 MG chewable tablet Chew 1 tablet (81 mg total) by mouth daily.     atorvastatin (LIPITOR) 80 MG tablet TAKE 1 TABLET(80 MG) BY MOUTH DAILY 90 tablet 2   buPROPion (WELLBUTRIN XL) 150 MG 24 hr tablet Take 150 mg by mouth every morning. Taking as needed     Continuous Blood Gluc Sensor (DEXCOM G6 SENSOR) MISC Change sensor every 10 days 9 each 3   Continuous Glucose Transmitter (DEXCOM G6 TRANSMITTER) MISC USE 1 DEVICE BY OTHER ROUTE AS DIRECTED 1 each 0   ezetimibe (ZETIA) 10 MG tablet Take 1 tablet (10 mg total) by mouth daily. 90 tablet 3   FLUoxetine (PROZAC) 20 MG capsule Take 60 mg by mouth daily.     injection device for insulin (INPEN 100-PINK-NOVOLOG-FIASP) DEVI Use with NovoLog cartridge for insulin injections 1 each 2   insulin aspart (NOVOLOG FLEXPEN) 100 UNIT/ML FlexPen Max daily dose 30 units 3 mL 0   insulin aspart (NOVOLOG) cartridge Max daily 30 units 30 mL 3   lamoTRIgine (LAMICTAL) 100 MG tablet Take 1 tablet (100 mg total) by mouth at bedtime. Take one tablet at bedtime. 30 tablet 1   metoprolol succinate (TOPROL-XL) 50 MG 24 hr tablet TAKE 1 TABLET(50 MG) BY MOUTH DAILY 15 tablet 0   metoprolol tartrate (LOPRESSOR) 25 MG tablet Take 0.5 tablets (12.5 mg total) by mouth  as needed (Use for fast heart rate longer than 15 minutes). 45 tablet 2   nitroGLYCERIN (NITROSTAT) 0.4 MG SL tablet DISSOLVE ONE TABLET UNDER TONGUE AS NEEDED FOR CHEST PAIN EVERY 5 MINUTES 25 tablet 2   NOVOFINE PEN NEEDLE 32G X 6 MM MISC 1 Device by Other route in the morning, at noon, in the evening, and at bedtime. 400 each 3   Semaglutide,0.25 or 0.5MG /DOS, 2 MG/3ML SOPN Inject 0.5 mg into the skin once a week. 3 mL 11   TRESIBA FLEXTOUCH 100 UNIT/ML FlexTouch Pen Inject 30 Units into the skin daily. 30 mL 3   No current facility-administered medications for this visit.     Family History    Family History  Problem Relation Age of Onset   Depression Father    Breast cancer Maternal  Grandmother    Depression Brother    Anxiety disorder Brother    ADD / ADHD Brother    Alcohol abuse Brother    Alcohol abuse Maternal Grandfather    Diabetes Neg Hx    She indicated that her mother is alive. She indicated that her father is alive. She indicated that the status of her brother is unknown. She indicated that her maternal grandmother is deceased. She indicated that the status of her maternal grandfather is unknown. She indicated that the status of her neg hx is unknown.  Social History    Social History   Socioeconomic History   Marital status: Single    Spouse name: Not on file   Number of children: Not on file   Years of education: Not on file   Highest education level: Not on file  Occupational History   Not on file  Tobacco Use   Smoking status: Former    Types: Cigarettes   Smokeless tobacco: Never   Tobacco comments:    patient stated she was smoking but has quit since 05/30/20  Vaping Use   Vaping status: Never Used  Substance and Sexual Activity   Alcohol use: Never   Drug use: Never   Sexual activity: Not on file  Other Topics Concern   Not on file  Social History Narrative   Not on file   Social Determinants of Health   Financial Resource Strain: Not on file  Food Insecurity: Not on file  Transportation Needs: Not on file  Physical Activity: Not on file  Stress: Not on file  Social Connections: Not on file  Intimate Partner Violence: Not on file     Review of Systems    General:  No chills, fever, night sweats or weight changes.  Cardiovascular:  No chest pain, dyspnea on exertion, edema, orthopnea, palpitations, paroxysmal nocturnal dyspnea. Dermatological: No rash, lesions/masses Respiratory: No cough, dyspnea Urologic: No hematuria, dysuria Abdominal:   No nausea, vomiting, diarrhea, bright red blood per rectum, melena, or hematemesis Neurologic:  No visual changes, wkns, changes in mental status. All other systems reviewed and are  otherwise negative except as noted above.       Physical Exam    VS:  There were no vitals taken for this visit. , BMI There is no height or weight on file to calculate BMI.     GEN: Well nourished, well developed, in no acute distress. HEENT: normal. Neck: Supple, no JVD, carotid bruits, or masses. Cardiac: RRR, no murmurs, rubs, or gallops. No clubbing, cyanosis, edema.  Radials/DP/PT 2+ and equal bilaterally.  Respiratory:  Respirations regular and unlabored, clear to auscultation bilaterally. GI: Soft,  nontender, nondistended, BS + x 4. MS: no deformity or atrophy. Skin: warm and dry, no rash. Neuro:  Strength and sensation are intact. Psych: Normal affect.      Lab Results  Component Value Date   WBC 8.7 02/26/2023   HGB 13.0 02/26/2023   HCT 39.4 02/26/2023   MCV 91.6 02/26/2023   PLT 325 02/26/2023   Lab Results  Component Value Date   CREATININE 0.65 02/26/2023   BUN 9 02/26/2023   NA 134 (L) 02/26/2023   K 3.9 02/26/2023   CL 101 02/26/2023   CO2 21 (L) 02/26/2023   Lab Results  Component Value Date   ALT 33 01/15/2022   AST 32 01/15/2022   ALKPHOS 112 01/15/2022   BILITOT 0.5 01/15/2022   Lab Results  Component Value Date   CHOL 123 08/09/2020   HDL 53 08/09/2020   LDLCALC 53 08/09/2020   TRIG 87 08/09/2020   CHOLHDL 2.3 08/09/2020    Lab Results  Component Value Date   HGBA1C 9.7 (A) 05/23/2022     Review of Prior Studies     Echocardiogram 04/06/2020  1. Left ventricular ejection fraction, by estimation, is 60 to 65%. The  left ventricle has normal function. The left ventricle has no regional  wall motion abnormalities. Left ventricular diastolic parameters were  normal.   2. Right ventricular systolic function is normal. The right ventricular  size is normal. Tricuspid regurgitation signal is inadequate for assessing  PA pressure.   3. The mitral valve is normal in structure. No evidence of mitral valve  regurgitation. No evidence of  mitral stenosis.   4. The aortic valve is tricuspid. Aortic valve regurgitation is not  visualized. No aortic stenosis is present.   5. The inferior vena cava is normal in size with greater than 50%  respiratory variability, suggesting right atrial pressure of 3 mmHg.      LHC 04/05/2020 Dr.Arida    There is mild left ventricular systolic dysfunction. LV end diastolic pressure is normal. Prox LAD to Mid LAD lesion is 30% stenosed. Mid LAD lesion is 95% stenosed. Post intervention, there is a 0% residual stenosis. A drug-eluting stent was successfully placed using a STENT RESOLUTE ONYX 2.25X18.   1.  Severe one-vessel coronary artery disease with 95% stenosis in the mid LAD just before the origin of the diagonal with evidence of thrombotic plaque rupture.  No other obstructive disease. 2.  Mildly reduced LV systolic function with distal and apical wall hypokinesis.  Normal LVEDP. 3.  Successful angioplasty and drug-eluting stent placement to the mid LAD.   Recommendations: Dual antiplatelet therapy for at least 1 year.  Assessment & Plan   1.  ***     {Are you ordering a CV Procedure (e.g. stress test, cath, DCCV, TEE, etc)?   Press F2        :[id number]}   Signed, Bettey Mare. Liborio Nixon, ANP, AACC   05/08/2023 10:50 AM      Office [phone] Fax [phone]  Notice: This dictation was prepared with Dragon dictation along with smaller phrase technology. Any transcriptional errors that result from this process are unintentional and may not be corrected upon review.

## 2023-05-12 ENCOUNTER — Ambulatory Visit: Payer: BC Managed Care – PPO | Attending: Adult Health | Admitting: Adult Health

## 2023-06-12 ENCOUNTER — Other Ambulatory Visit: Payer: Self-pay | Admitting: Cardiovascular Disease

## 2023-06-12 DIAGNOSIS — R002 Palpitations: Secondary | ICD-10-CM

## 2023-07-11 ENCOUNTER — Other Ambulatory Visit: Payer: Self-pay | Admitting: Internal Medicine

## 2023-07-11 DIAGNOSIS — E109 Type 1 diabetes mellitus without complications: Secondary | ICD-10-CM

## 2023-10-20 ENCOUNTER — Other Ambulatory Visit: Payer: Self-pay | Admitting: Internal Medicine

## 2024-01-17 ENCOUNTER — Other Ambulatory Visit: Payer: Self-pay | Admitting: Cardiovascular Disease

## 2024-01-21 ENCOUNTER — Other Ambulatory Visit: Payer: Self-pay | Admitting: Family Medicine

## 2024-01-21 DIAGNOSIS — Z1231 Encounter for screening mammogram for malignant neoplasm of breast: Secondary | ICD-10-CM

## 2024-04-01 DIAGNOSIS — F330 Major depressive disorder, recurrent, mild: Secondary | ICD-10-CM | POA: Diagnosis not present

## 2024-04-01 DIAGNOSIS — F603 Borderline personality disorder: Secondary | ICD-10-CM | POA: Diagnosis not present

## 2024-04-01 DIAGNOSIS — F410 Panic disorder [episodic paroxysmal anxiety] without agoraphobia: Secondary | ICD-10-CM | POA: Diagnosis not present

## 2024-04-01 DIAGNOSIS — F4311 Post-traumatic stress disorder, acute: Secondary | ICD-10-CM | POA: Diagnosis not present

## 2024-04-08 DIAGNOSIS — F603 Borderline personality disorder: Secondary | ICD-10-CM | POA: Diagnosis not present

## 2024-04-08 DIAGNOSIS — F4311 Post-traumatic stress disorder, acute: Secondary | ICD-10-CM | POA: Diagnosis not present

## 2024-04-08 DIAGNOSIS — F330 Major depressive disorder, recurrent, mild: Secondary | ICD-10-CM | POA: Diagnosis not present

## 2024-04-15 DIAGNOSIS — F603 Borderline personality disorder: Secondary | ICD-10-CM | POA: Diagnosis not present

## 2024-04-15 DIAGNOSIS — F330 Major depressive disorder, recurrent, mild: Secondary | ICD-10-CM | POA: Diagnosis not present

## 2024-04-15 DIAGNOSIS — F4311 Post-traumatic stress disorder, acute: Secondary | ICD-10-CM | POA: Diagnosis not present

## 2024-04-19 DIAGNOSIS — F330 Major depressive disorder, recurrent, mild: Secondary | ICD-10-CM | POA: Diagnosis not present

## 2024-04-19 DIAGNOSIS — F603 Borderline personality disorder: Secondary | ICD-10-CM | POA: Diagnosis not present

## 2024-04-19 DIAGNOSIS — F4311 Post-traumatic stress disorder, acute: Secondary | ICD-10-CM | POA: Diagnosis not present

## 2024-04-21 DIAGNOSIS — E1065 Type 1 diabetes mellitus with hyperglycemia: Secondary | ICD-10-CM | POA: Diagnosis not present

## 2024-04-22 DIAGNOSIS — F410 Panic disorder [episodic paroxysmal anxiety] without agoraphobia: Secondary | ICD-10-CM | POA: Diagnosis not present

## 2024-04-22 DIAGNOSIS — F603 Borderline personality disorder: Secondary | ICD-10-CM | POA: Diagnosis not present

## 2024-04-22 DIAGNOSIS — F4311 Post-traumatic stress disorder, acute: Secondary | ICD-10-CM | POA: Diagnosis not present

## 2024-04-22 DIAGNOSIS — F330 Major depressive disorder, recurrent, mild: Secondary | ICD-10-CM | POA: Diagnosis not present

## 2024-04-26 DIAGNOSIS — F603 Borderline personality disorder: Secondary | ICD-10-CM | POA: Diagnosis not present

## 2024-04-26 DIAGNOSIS — F330 Major depressive disorder, recurrent, mild: Secondary | ICD-10-CM | POA: Diagnosis not present

## 2024-04-26 DIAGNOSIS — F4311 Post-traumatic stress disorder, acute: Secondary | ICD-10-CM | POA: Diagnosis not present

## 2024-04-29 DIAGNOSIS — F410 Panic disorder [episodic paroxysmal anxiety] without agoraphobia: Secondary | ICD-10-CM | POA: Diagnosis not present

## 2024-04-29 DIAGNOSIS — F603 Borderline personality disorder: Secondary | ICD-10-CM | POA: Diagnosis not present

## 2024-04-29 DIAGNOSIS — F330 Major depressive disorder, recurrent, mild: Secondary | ICD-10-CM | POA: Diagnosis not present

## 2024-04-29 DIAGNOSIS — F4311 Post-traumatic stress disorder, acute: Secondary | ICD-10-CM | POA: Diagnosis not present

## 2024-05-03 DIAGNOSIS — F603 Borderline personality disorder: Secondary | ICD-10-CM | POA: Diagnosis not present

## 2024-05-03 DIAGNOSIS — F4311 Post-traumatic stress disorder, acute: Secondary | ICD-10-CM | POA: Diagnosis not present

## 2024-05-03 DIAGNOSIS — F410 Panic disorder [episodic paroxysmal anxiety] without agoraphobia: Secondary | ICD-10-CM | POA: Diagnosis not present

## 2024-05-03 DIAGNOSIS — F330 Major depressive disorder, recurrent, mild: Secondary | ICD-10-CM | POA: Diagnosis not present

## 2024-05-05 DIAGNOSIS — F603 Borderline personality disorder: Secondary | ICD-10-CM | POA: Diagnosis not present

## 2024-05-05 DIAGNOSIS — F411 Generalized anxiety disorder: Secondary | ICD-10-CM | POA: Diagnosis not present

## 2024-05-06 DIAGNOSIS — F603 Borderline personality disorder: Secondary | ICD-10-CM | POA: Diagnosis not present

## 2024-05-06 DIAGNOSIS — F330 Major depressive disorder, recurrent, mild: Secondary | ICD-10-CM | POA: Diagnosis not present

## 2024-05-06 DIAGNOSIS — F4311 Post-traumatic stress disorder, acute: Secondary | ICD-10-CM | POA: Diagnosis not present

## 2024-05-06 DIAGNOSIS — F410 Panic disorder [episodic paroxysmal anxiety] without agoraphobia: Secondary | ICD-10-CM | POA: Diagnosis not present

## 2024-05-10 DIAGNOSIS — F330 Major depressive disorder, recurrent, mild: Secondary | ICD-10-CM | POA: Diagnosis not present

## 2024-05-10 DIAGNOSIS — F410 Panic disorder [episodic paroxysmal anxiety] without agoraphobia: Secondary | ICD-10-CM | POA: Diagnosis not present

## 2024-05-10 DIAGNOSIS — F603 Borderline personality disorder: Secondary | ICD-10-CM | POA: Diagnosis not present

## 2024-05-10 DIAGNOSIS — F4311 Post-traumatic stress disorder, acute: Secondary | ICD-10-CM | POA: Diagnosis not present

## 2024-05-13 DIAGNOSIS — F4311 Post-traumatic stress disorder, acute: Secondary | ICD-10-CM | POA: Diagnosis not present

## 2024-05-13 DIAGNOSIS — F330 Major depressive disorder, recurrent, mild: Secondary | ICD-10-CM | POA: Diagnosis not present

## 2024-05-13 DIAGNOSIS — F410 Panic disorder [episodic paroxysmal anxiety] without agoraphobia: Secondary | ICD-10-CM | POA: Diagnosis not present

## 2024-05-13 DIAGNOSIS — F603 Borderline personality disorder: Secondary | ICD-10-CM | POA: Diagnosis not present

## 2024-05-17 DIAGNOSIS — F603 Borderline personality disorder: Secondary | ICD-10-CM | POA: Diagnosis not present

## 2024-05-17 DIAGNOSIS — F330 Major depressive disorder, recurrent, mild: Secondary | ICD-10-CM | POA: Diagnosis not present

## 2024-05-17 DIAGNOSIS — F410 Panic disorder [episodic paroxysmal anxiety] without agoraphobia: Secondary | ICD-10-CM | POA: Diagnosis not present

## 2024-05-17 DIAGNOSIS — F4311 Post-traumatic stress disorder, acute: Secondary | ICD-10-CM | POA: Diagnosis not present

## 2024-05-20 DIAGNOSIS — F330 Major depressive disorder, recurrent, mild: Secondary | ICD-10-CM | POA: Diagnosis not present

## 2024-05-20 DIAGNOSIS — F603 Borderline personality disorder: Secondary | ICD-10-CM | POA: Diagnosis not present

## 2024-05-20 DIAGNOSIS — F410 Panic disorder [episodic paroxysmal anxiety] without agoraphobia: Secondary | ICD-10-CM | POA: Diagnosis not present

## 2024-05-20 DIAGNOSIS — F4311 Post-traumatic stress disorder, acute: Secondary | ICD-10-CM | POA: Diagnosis not present

## 2024-05-24 DIAGNOSIS — F4311 Post-traumatic stress disorder, acute: Secondary | ICD-10-CM | POA: Diagnosis not present

## 2024-05-24 DIAGNOSIS — F410 Panic disorder [episodic paroxysmal anxiety] without agoraphobia: Secondary | ICD-10-CM | POA: Diagnosis not present

## 2024-05-24 DIAGNOSIS — F603 Borderline personality disorder: Secondary | ICD-10-CM | POA: Diagnosis not present

## 2024-05-24 DIAGNOSIS — F330 Major depressive disorder, recurrent, mild: Secondary | ICD-10-CM | POA: Diagnosis not present

## 2024-05-27 ENCOUNTER — Other Ambulatory Visit: Payer: Self-pay | Admitting: Cardiovascular Disease

## 2024-05-27 DIAGNOSIS — F603 Borderline personality disorder: Secondary | ICD-10-CM | POA: Diagnosis not present

## 2024-05-27 DIAGNOSIS — F4311 Post-traumatic stress disorder, acute: Secondary | ICD-10-CM | POA: Diagnosis not present

## 2024-05-27 DIAGNOSIS — F330 Major depressive disorder, recurrent, mild: Secondary | ICD-10-CM | POA: Diagnosis not present

## 2024-05-27 DIAGNOSIS — R002 Palpitations: Secondary | ICD-10-CM

## 2024-06-25 ENCOUNTER — Other Ambulatory Visit: Payer: Self-pay | Admitting: Cardiovascular Disease

## 2024-06-25 DIAGNOSIS — R002 Palpitations: Secondary | ICD-10-CM

## 2024-07-25 ENCOUNTER — Telehealth: Payer: Self-pay | Admitting: Cardiovascular Disease

## 2024-07-25 DIAGNOSIS — R002 Palpitations: Secondary | ICD-10-CM

## 2024-08-20 MED ORDER — METOPROLOL SUCCINATE ER 50 MG PO TB24: 50.0000 mg | ORAL_TABLET | Freq: Every day | ORAL | 0 refills | Status: DC

## 2024-08-20 NOTE — Telephone Encounter (Signed)
" °*  STAT* If patient is at the pharmacy, call can be transferred to refill team.   1. Which medications need to be refilled? (please list name of each medication and dose if known)   metoprolol  succinate (TOPROL -XL) 50 MG 24 hr tablet   2. Would you like to learn more about the convenience, safety, & potential cost savings by using the [facility]?   3. Are you open to using the [facility] (Type [facility]. ).  4. Which pharmacy/location (including street and city if local pharmacy) is medication to be sent to?  [id number] - [place], [place] - [address] [facility] [address]   5. Do they need a 30 day or 90 day supply?   Patient stated she has 2 days left of this medication.  Patient has appointment scheduled with Dr. Court on 1/28.    "

## 2024-08-20 NOTE — Addendum Note (Signed)
 Addended by: DEVORA CASTOR A on: 08/20/2024 02:04 PM   Modules accepted: Orders

## 2024-08-20 NOTE — Telephone Encounter (Signed)
 Refill requested sent to preferred pharmacy and notified pt.

## 2024-08-25 ENCOUNTER — Encounter: Payer: Self-pay | Admitting: Cardiovascular Disease

## 2024-08-25 ENCOUNTER — Ambulatory Visit: Admitting: Cardiovascular Disease

## 2024-08-25 VITALS — BP 128/78 | HR 93 | Ht 68.0 in | Wt 195.4 lb

## 2024-08-25 DIAGNOSIS — Z720 Tobacco use: Secondary | ICD-10-CM | POA: Insufficient documentation

## 2024-08-25 DIAGNOSIS — I2510 Atherosclerotic heart disease of native coronary artery without angina pectoris: Secondary | ICD-10-CM | POA: Diagnosis not present

## 2024-08-25 DIAGNOSIS — E7800 Pure hypercholesterolemia, unspecified: Secondary | ICD-10-CM | POA: Diagnosis not present

## 2024-08-25 DIAGNOSIS — E782 Mixed hyperlipidemia: Secondary | ICD-10-CM | POA: Diagnosis not present

## 2024-08-25 DIAGNOSIS — R002 Palpitations: Secondary | ICD-10-CM | POA: Diagnosis not present

## 2024-08-25 DIAGNOSIS — I2102 ST elevation (STEMI) myocardial infarction involving left anterior descending coronary artery: Secondary | ICD-10-CM | POA: Insufficient documentation

## 2024-08-25 MED ORDER — METOPROLOL SUCCINATE ER 50 MG PO TB24: 50.0000 mg | ORAL_TABLET | Freq: Every day | ORAL | 3 refills | Status: AC

## 2024-08-25 MED ORDER — ATORVASTATIN CALCIUM 80 MG PO TABS: 80.0000 mg | ORAL_TABLET | Freq: Every day | ORAL | 3 refills | Status: AC

## 2024-08-25 MED ORDER — NITROGLYCERIN 0.4 MG SL SUBL: 0.4000 mg | SUBLINGUAL_TABLET | SUBLINGUAL | 3 refills | Status: AC | PRN

## 2024-08-25 MED ORDER — EZETIMIBE 10 MG PO TABS: 10.0000 mg | ORAL_TABLET | Freq: Every day | ORAL | 3 refills | Status: AC

## 2024-08-25 NOTE — Patient Instructions (Signed)

## 2024-08-25 NOTE — Assessment & Plan Note (Signed)
 History of non-STEMI 05/05/2020 with card catheterization performed by Dr. Darron that day revealing 95% mid LAD lesion which was stented with a 2.25 mm x 18 mm long resolute Onyx drug-eluting stent.  She had no other significant CAD and her LV function was normal.  She is completely asymptomatic and remains on aspirin  monotherapy.

## 2024-08-25 NOTE — Assessment & Plan Note (Signed)
 Improved.  Probably somewhat related to caffeine which she now uses decaf and anxiety on SSRI in the past.

## 2024-08-25 NOTE — Assessment & Plan Note (Signed)
 On high-dose statin therapy and Zetia  with lipid profile performed 01/06/2024 revealing total cholesterol 136, LDL 61 and HDL of 60.

## 2024-08-25 NOTE — Assessment & Plan Note (Signed)
Discontinued remotely

## 2024-08-25 NOTE — Progress Notes (Signed)
 "     08/25/2024 Alicia Phillips   1981-03-07  [id number]  Primary Physician Rolinda Millman, MD Primary Cardiologist: Dorn JINNY Lesches MD [facility] [place]  HPI:  Alicia Phillips is a 44 y.o.   mildly overweight single Caucasian female with no children who has a history of type 1 diabetes since 1986 tobacco abuse who presented on 05/05/2020 with an anterior non-STEMI.  I last saw her in the office 12/11/2021.  She underwent diagnostic coronary angiography the same day by Dr. Darron found a 95% mid LAD lesion that was stented with a 2.25 mm x 18 mm long resolute Onyx drug-eluting stent.  She had no other significant CAD.  LV function was normal.  Since that time she has stopped smoking.  She did have elevated lipids during that admit admission but was discharged home on high-dose statin therapy in addition to dual antiplatelet therapy including low-dose aspirin  and Brilinta  twice daily.  She was seen in the ER on 05/02/2020 with palpitations which she thought was PVCs and were similar to her presenting symptoms although there was no objective evidence of this and she was sent home on double the beta-blocker dose.  She has felt well since.   Since I saw her in the office 2-1/2 years ago she continues to do well.  She no longer has palpitations.  She denies chest pain or shortness of breath.  She does yoga and walks without symptoms.  She has decreased her caffeine intake.  She is on insulin  pump and has a Dexcom to follow her serum glucose.   Active Medications[1]   Allergies[2]  Social History   Socioeconomic History   Marital status: Single    Spouse name: Not on file   Number of children: Not on file   Years of education: Not on file   Highest education level: Not on file  Occupational History   Not on file  Tobacco Use   Smoking status: Former    Types: Cigarettes   Smokeless tobacco: Never   Tobacco comments:    patient stated she was smoking but has quit since 05/30/20  Vaping  Use   Vaping status: Never Used  Substance and Sexual Activity   Alcohol use: Never   Drug use: Never   Sexual activity: Not on file  Other Topics Concern   Not on file  Social History Narrative   Not on file   Social Drivers of Health   Tobacco Use: Medium Risk (08/25/2024)   Patient History    Smoking Tobacco Use: Former    Smokeless Tobacco Use: Never    Passive Exposure: Not on Actuary Strain: Not on file  Food Insecurity: Not on file  Transportation Needs: Not on file  Physical Activity: Not on file  Stress: Not on file  Social Connections: Not on file  Intimate Partner Violence: Not on file  Depression (EYV7-0): Not on file  Alcohol Screen: Not on file  Housing: Not on file  Utilities: Not on file  Health Literacy: Not on file     Review of Systems: General: negative for chills, fever, night sweats or weight changes.  Cardiovascular: negative for chest pain, dyspnea on exertion, edema, orthopnea, palpitations, paroxysmal nocturnal dyspnea or shortness of breath Dermatological: negative for rash Respiratory: negative for cough or wheezing Urologic: negative for hematuria Abdominal: negative for nausea, vomiting, diarrhea, bright red blood per rectum, melena, or hematemesis Neurologic: negative for visual changes, syncope, or dizziness All other  systems reviewed and are otherwise negative except as noted above.    Blood pressure 128/78, pulse 93, height 5' 8 (1.727 m), weight 195 lb 6.4 oz (88.6 kg), last menstrual period 08/16/2024, SpO2 97%.  General appearance: alert and no distress Neck: no adenopathy, no carotid bruit, no JVD, supple, symmetrical, trachea midline, and thyroid not enlarged, symmetric, no tenderness/mass/nodules Lungs: clear to auscultation bilaterally Heart: regular rate and rhythm, S1, S2 normal, no murmur, click, rub or gallop Extremities: extremities normal, atraumatic, no cyanosis or edema Pulses: 2+ and symmetric Skin:  Skin color, texture, turgor normal. No rashes or lesions Neurologic: Grossly normal  EKG EKG Interpretation Date/Time:  Wednesday August 25 2024 10:36:16 EST Ventricular Rate:  93 PR Interval:  150 QRS Duration:  78 QT Interval:  356 QTC Calculation: 442 R Axis:   62  Text Interpretation: Normal sinus rhythm Normal ECG When compared with ECG of 26-Feb-2023 22:23, No significant change was found Confirmed by Court Carrier [phone]) on 08/25/2024 10:45:10 AM    ASSESSMENT AND PLAN:   STEMI (ST elevation myocardial infarction) (HCC) History of non-STEMI 05/05/2020 with card catheterization performed by Dr. Darron that day revealing 95% mid LAD lesion which was stented with a 2.25 mm x 18 mm long resolute Onyx drug-eluting stent.  She had no other significant CAD and her LV function was normal.  She is completely asymptomatic and remains on aspirin  monotherapy.  Tobacco abuse Discontinued remotely  Hyperlipidemia On high-dose statin therapy and Zetia  with lipid profile performed 01/06/2024 revealing total cholesterol 136, LDL 61 and HDL of 60.  Palpitations Improved.  Probably somewhat related to caffeine which she now uses decaf and anxiety on SSRI in the past.     Carrier DOROTHA Court MD [facility], FSCAI 08/25/2024 10:53 AM    [1]  Current Meds  Medication Sig   ACETAMINOPHEN  EXTRA STRENGTH 500 MG tablet Take 500 mg by mouth every 4 (four) hours as needed for mild pain or headache.    acetone, urine, test strip 1 strip by Does not apply route as needed for high blood sugar.   aspirin  81 MG chewable tablet Chew 1 tablet (81 mg total) by mouth daily.   atorvastatin  (LIPITOR ) 80 MG tablet TAKE 1 TABLET(80 MG) BY MOUTH DAILY   buPROPion (WELLBUTRIN XL) 150 MG 24 hr tablet Take 150 mg by mouth every morning. Taking as needed   busPIRone (BUSPAR) 5 MG tablet Take 5 mg by mouth 3 (three) times daily.   Continuous Glucose Transmitter (DEXCOM G6 TRANSMITTER) MISC USE 1 DEVICE BY  OTHER ROUTE AS DIRECTED   ezetimibe  (ZETIA ) 10 MG tablet Take 1 tablet (10 mg total) by mouth daily.   injection device for insulin  (INPEN 100-PINK-NOVOLOG -FIASP ) DEVI Use with NovoLog  cartridge for insulin  injections   insulin  aspart (NOVOLOG  FLEXPEN) 100 UNIT/ML FlexPen Max daily dose 30 units   insulin  aspart (NOVOLOG ) cartridge Max daily 30 units   Insulin  Disposable Pump (OMNIPOD 5 DEXG7G6 PODS GEN 5) MISC SMARTSIG:1 SUB-Q Every Other Day   lamoTRIgine  (LAMICTAL ) 100 MG tablet Take 1 tablet (100 mg total) by mouth at bedtime. Take one tablet at bedtime.   metoprolol  succinate (TOPROL -XL) 50 MG 24 hr tablet Take 1 tablet (50 mg total) by mouth daily. Take with or immediately following a meal.   metoprolol  tartrate (LOPRESSOR ) 25 MG tablet Take 0.5 tablets (12.5 mg total) by mouth as needed (Use for fast heart rate longer than 15 minutes).   nitroGLYCERIN  (NITROSTAT ) 0.4 MG SL tablet DISSOLVE ONE  TABLET UNDER TONGUE AS NEEDED FOR CHEST PAIN EVERY 5 MINUTES   NOVOFINE PEN NEEDLE 32G X 6 MM MISC 1 Device by Other route in the morning, at noon, in the evening, and at bedtime.   Semaglutide ,0.25 or 0.5MG /DOS, 2 MG/3ML SOPN Inject 0.5 mg into the skin once a week.   sertraline (ZOLOFT) 100 MG tablet Take 150 mg by mouth daily.   TRESIBA  FLEXTOUCH 100 UNIT/ML FlexTouch Pen Inject 30 Units into the skin daily.  [2]  Allergies Allergen Reactions   Sulfamethoxazole-Trimethoprim Rash   "
